# Patient Record
Sex: Male | Born: 1985 | Race: Black or African American | Hispanic: No | Marital: Single | Smoking: Never smoker
Health system: Southern US, Community
[De-identification: ages and names within clinical notes are randomized; demographics above are authoritative.]

## PROBLEM LIST (undated history)

## (undated) DIAGNOSIS — F0390 Unspecified dementia without behavioral disturbance: Secondary | ICD-10-CM

## (undated) DIAGNOSIS — F250 Schizoaffective disorder, bipolar type: Secondary | ICD-10-CM

## (undated) DIAGNOSIS — R413 Other amnesia: Secondary | ICD-10-CM

## (undated) DIAGNOSIS — F259 Schizoaffective disorder, unspecified: Secondary | ICD-10-CM

## (undated) DIAGNOSIS — R569 Unspecified convulsions: Secondary | ICD-10-CM

## (undated) DIAGNOSIS — L732 Hidradenitis suppurativa: Secondary | ICD-10-CM

## (undated) DIAGNOSIS — F909 Attention-deficit hyperactivity disorder, unspecified type: Secondary | ICD-10-CM

## (undated) DIAGNOSIS — F79 Unspecified intellectual disabilities: Secondary | ICD-10-CM

## (undated) DIAGNOSIS — F319 Bipolar disorder, unspecified: Secondary | ICD-10-CM

## (undated) HISTORY — PX: APPENDECTOMY: SHX54

---
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
## 2007-03-18 ENCOUNTER — Emergency Department (HOSPITAL_COMMUNITY): Admission: EM | Admit: 2007-03-18 | Discharge: 2007-03-18 | Payer: Self-pay | Admitting: Emergency Medicine

## 2007-10-29 DIAGNOSIS — K5900 Constipation, unspecified: Secondary | ICD-10-CM | POA: Diagnosis present

## 2008-07-15 ENCOUNTER — Inpatient Hospital Stay (HOSPITAL_COMMUNITY): Admission: EM | Admit: 2008-07-15 | Discharge: 2008-07-20 | Payer: Self-pay | Admitting: Emergency Medicine

## 2008-07-20 ENCOUNTER — Ambulatory Visit: Payer: Self-pay | Admitting: Psychiatry

## 2008-07-20 ENCOUNTER — Inpatient Hospital Stay (HOSPITAL_COMMUNITY): Admission: EM | Admit: 2008-07-20 | Discharge: 2008-07-30 | Payer: Self-pay | Admitting: Psychiatry

## 2008-09-05 ENCOUNTER — Inpatient Hospital Stay (HOSPITAL_COMMUNITY): Admission: EM | Admit: 2008-09-05 | Discharge: 2008-09-21 | Payer: Self-pay | Admitting: Emergency Medicine

## 2008-09-21 ENCOUNTER — Ambulatory Visit: Payer: Self-pay | Admitting: Psychiatry

## 2008-09-21 ENCOUNTER — Inpatient Hospital Stay (HOSPITAL_COMMUNITY): Admission: AD | Admit: 2008-09-21 | Discharge: 2008-09-25 | Payer: Self-pay | Admitting: Psychiatry

## 2010-05-28 ENCOUNTER — Emergency Department (HOSPITAL_BASED_OUTPATIENT_CLINIC_OR_DEPARTMENT_OTHER)
Admission: EM | Admit: 2010-05-28 | Discharge: 2010-05-29 | Disposition: A | Discharge status: Home / Self Care | Payer: Medicare Other | Attending: Emergency Medicine | Admitting: Emergency Medicine

## 2010-05-28 ENCOUNTER — Emergency Department (INDEPENDENT_AMBULATORY_CARE_PROVIDER_SITE_OTHER): Payer: Medicare Other

## 2010-05-28 DIAGNOSIS — Z0289 Encounter for other administrative examinations: Secondary | ICD-10-CM

## 2010-05-28 DIAGNOSIS — F79 Unspecified intellectual disabilities: Secondary | ICD-10-CM

## 2010-05-28 DIAGNOSIS — F489 Nonpsychotic mental disorder, unspecified: Secondary | ICD-10-CM | POA: Insufficient documentation

## 2010-05-28 DIAGNOSIS — F319 Bipolar disorder, unspecified: Secondary | ICD-10-CM | POA: Insufficient documentation

## 2010-05-28 DIAGNOSIS — Z8659 Personal history of other mental and behavioral disorders: Secondary | ICD-10-CM | POA: Insufficient documentation

## 2010-05-28 LAB — URINALYSIS, ROUTINE W REFLEX MICROSCOPIC
Ketones, ur: NEGATIVE mg/dL
Nitrite: NEGATIVE
Protein, ur: NEGATIVE mg/dL
Urobilinogen, UA: 1 mg/dL (ref 0.0–1.0)
pH: 6.5 (ref 5.0–8.0)

## 2010-05-28 LAB — DIFFERENTIAL
Basophils Absolute: 0 10*3/uL (ref 0.0–0.1)
Basophils Relative: 0 % (ref 0–1)
Eosinophils Absolute: 0.1 10*3/uL (ref 0.0–0.7)
Eosinophils Relative: 1 % (ref 0–5)
Lymphocytes Relative: 39 % (ref 12–46)
Lymphs Abs: 3 10*3/uL (ref 0.7–4.0)
Monocytes Absolute: 0.7 10*3/uL (ref 0.1–1.0)
Monocytes Relative: 8 % (ref 3–12)
Neutro Abs: 4 10*3/uL (ref 1.7–7.7)
Neutrophils Relative %: 52 % (ref 43–77)

## 2010-05-28 LAB — COMPREHENSIVE METABOLIC PANEL
Alkaline Phosphatase: 103 U/L (ref 39–117)
BUN: 10 mg/dL (ref 6–23)
Glucose, Bld: 92 mg/dL (ref 70–99)
Potassium: 3.9 mEq/L (ref 3.5–5.1)
Total Protein: 7.9 g/dL (ref 6.0–8.3)

## 2010-05-28 LAB — CBC
HCT: 38.7 % — ABNORMAL LOW (ref 39.0–52.0)
MCHC: 34.9 g/dL (ref 30.0–36.0)
MCV: 83.8 fL (ref 78.0–100.0)
RDW: 12.5 % (ref 11.5–15.5)
WBC: 7.8 10*3/uL (ref 4.0–10.5)

## 2010-05-28 LAB — POCT TOXICOLOGY PANEL

## 2010-05-28 LAB — ETHANOL: Alcohol, Ethyl (B): <10 mg/dL (ref 0–10)

## 2010-05-28 LAB — VALPROIC ACID LEVEL: Valproic Acid Lvl: 34.6 ug/mL — ABNORMAL LOW (ref 50.0–100.0)

## 2010-06-13 LAB — COMPREHENSIVE METABOLIC PANEL
AST: 16 U/L (ref 0–37)
BUN: 5 mg/dL — ABNORMAL LOW (ref 6–23)
CO2: 29 mEq/L (ref 19–32)
Calcium: 8.9 mg/dL (ref 8.4–10.5)
Chloride: 107 mEq/L (ref 96–112)
Creatinine, Ser: 0.84 mg/dL (ref 0.4–1.5)
GFR calc Af Amer: >60 mL/min (ref >60)
GFR calc non Af Amer: >60 mL/min (ref >60)
Glucose, Bld: 97 mg/dL (ref 70–99)
Total Bilirubin: 0.2 mg/dL — ABNORMAL LOW (ref 0.3–1.2)

## 2010-06-13 LAB — VALPROIC ACID LEVEL
Valproic Acid Lvl: 162.9 ug/mL — ABNORMAL HIGH (ref 50.0–100.0)
Valproic Acid Lvl: 84.4 ug/mL (ref 50.0–100.0)

## 2010-06-13 LAB — DIFFERENTIAL
Basophils Relative: 1 % (ref 0–1)
Lymphs Abs: 2 10*3/uL (ref 0.7–4.0)
Monocytes Absolute: 0.5 10*3/uL (ref 0.1–1.0)
Monocytes Relative: 10 % (ref 3–12)
Neutro Abs: 2.8 10*3/uL (ref 1.7–7.7)

## 2010-06-13 LAB — CBC
HCT: 39.4 % (ref 39.0–52.0)
Hemoglobin: 13.1 g/dL (ref 13.0–17.0)
Hemoglobin: 13.4 g/dL (ref 13.0–17.0)
MCHC: 34 g/dL (ref 30.0–36.0)
MCV: 92.4 fL (ref 78.0–100.0)
MCV: 92.4 fL (ref 78.0–100.0)
Platelets: 214 10*3/uL (ref 150–400)
RBC: 4.17 MIL/uL — ABNORMAL LOW (ref 4.22–5.81)
RBC: 4.26 MIL/uL (ref 4.22–5.81)
WBC: 5.3 10*3/uL (ref 4.0–10.5)
WBC: 8.3 10*3/uL (ref 4.0–10.5)

## 2010-06-13 LAB — BASIC METABOLIC PANEL
CO2: 29 mEq/L (ref 19–32)
Calcium: 9.3 mg/dL (ref 8.4–10.5)
Chloride: 102 mEq/L (ref 96–112)
Chloride: 105 mEq/L (ref 96–112)
GFR calc Af Amer: >60 mL/min (ref >60)
GFR calc Af Amer: >60 mL/min (ref >60)
GFR calc non Af Amer: >60 mL/min (ref >60)
Potassium: 4.1 mEq/L (ref 3.5–5.1)
Sodium: 140 mEq/L (ref 135–145)
Sodium: 141 mEq/L (ref 135–145)

## 2010-06-13 LAB — URINALYSIS, ROUTINE W REFLEX MICROSCOPIC
Glucose, UA: NEGATIVE mg/dL
Hgb urine dipstick: NEGATIVE
Specific Gravity, Urine: 1.025 (ref 1.005–1.030)
pH: 6.5 (ref 5.0–8.0)

## 2010-06-13 LAB — TSH: TSH: 1.885 u[IU]/mL (ref 0.350–4.500)

## 2010-06-13 LAB — ACETAMINOPHEN LEVEL: Acetaminophen (Tylenol), Serum: <10 ug/mL — ABNORMAL LOW (ref 10–30)

## 2010-06-13 LAB — GLUCOSE, CAPILLARY: Glucose-Capillary: 81 mg/dL (ref 70–99)

## 2010-06-13 LAB — RAPID URINE DRUG SCREEN, HOSP PERFORMED
Barbiturates: NOT DETECTED
Opiates: NOT DETECTED

## 2010-06-15 LAB — URINALYSIS, ROUTINE W REFLEX MICROSCOPIC
Glucose, UA: NEGATIVE mg/dL
Nitrite: NEGATIVE
Protein, ur: NEGATIVE mg/dL
pH: 7.5 (ref 5.0–8.0)

## 2010-06-15 LAB — COMPREHENSIVE METABOLIC PANEL
ALT: 25 U/L (ref 0–53)
ALT: 20 U/L (ref 0–53)
AST: 31 U/L (ref 0–37)
Albumin: 3.1 g/dL — ABNORMAL LOW (ref 3.5–5.2)
Alkaline Phosphatase: 68 U/L (ref 39–117)
Alkaline Phosphatase: 76 U/L (ref 39–117)
BUN: 12 mg/dL (ref 6–23)
BUN: 7 mg/dL (ref 6–23)
CO2: 33 mEq/L — ABNORMAL HIGH (ref 19–32)
CO2: 28 mEq/L (ref 19–32)
Calcium: 9.4 mg/dL (ref 8.4–10.5)
Chloride: 101 mEq/L (ref 96–112)
Creatinine, Ser: 0.78 mg/dL (ref 0.4–1.5)
GFR calc Af Amer: >60 mL/min (ref >60)
GFR calc Af Amer: >60 mL/min (ref >60)
GFR calc non Af Amer: >60 mL/min (ref >60)
GFR calc non Af Amer: >60 mL/min (ref >60)
Glucose, Bld: 83 mg/dL (ref 70–99)
Glucose, Bld: 93 mg/dL (ref 70–99)
Potassium: 3.3 mEq/L — ABNORMAL LOW (ref 3.5–5.1)
Potassium: 3.5 mEq/L (ref 3.5–5.1)
Sodium: 143 mEq/L (ref 135–145)
Sodium: 138 mEq/L (ref 135–145)
Total Bilirubin: 0.8 mg/dL (ref 0.3–1.2)
Total Protein: 6.5 g/dL (ref 6.0–8.3)
Total Protein: 5.4 g/dL — ABNORMAL LOW (ref 6.0–8.3)

## 2010-06-15 LAB — ETHANOL: Alcohol, Ethyl (B): <5 mg/dL (ref 0–10)

## 2010-06-15 LAB — FERRITIN: Ferritin: 126 ng/mL (ref 22–322)

## 2010-06-15 LAB — CBC
HCT: 38.4 % — ABNORMAL LOW (ref 39.0–52.0)
HCT: 36.7 % — ABNORMAL LOW (ref 39.0–52.0)
HCT: 38.8 % — ABNORMAL LOW (ref 39.0–52.0)
Hemoglobin: 12.8 g/dL — ABNORMAL LOW (ref 13.0–17.0)
Hemoglobin: 13.2 g/dL (ref 13.0–17.0)
Hemoglobin: 13.5 g/dL (ref 13.0–17.0)
MCHC: 33.4 g/dL (ref 30.0–36.0)
MCV: 92.6 fL (ref 78.0–100.0)
MCV: 93 fL (ref 78.0–100.0)
Platelets: 136 10*3/uL — ABNORMAL LOW (ref 150–400)
Platelets: 141 10*3/uL — ABNORMAL LOW (ref 150–400)
RBC: 4.14 MIL/uL — ABNORMAL LOW (ref 4.22–5.81)
RBC: 4.19 MIL/uL — ABNORMAL LOW (ref 4.22–5.81)
RDW: 13.4 % (ref 11.5–15.5)
RDW: 13.1 % (ref 11.5–15.5)
RDW: 13.5 % (ref 11.5–15.5)
WBC: 5.6 10*3/uL (ref 4.0–10.5)

## 2010-06-15 LAB — DIFFERENTIAL
Basophils Relative: 0 % (ref 0–1)
Eosinophils Absolute: 0 10*3/uL (ref 0.0–0.7)
Eosinophils Relative: 0 % (ref 0–5)
Lymphs Abs: 1.8 10*3/uL (ref 0.7–4.0)
Neutrophils Relative %: 60 % (ref 43–77)

## 2010-06-15 LAB — BASIC METABOLIC PANEL
BUN: 7 mg/dL (ref 6–23)
Chloride: 104 mEq/L (ref 96–112)
Glucose, Bld: 105 mg/dL — ABNORMAL HIGH (ref 70–99)
Potassium: 4.1 mEq/L (ref 3.5–5.1)

## 2010-06-15 LAB — SALICYLATE LEVEL: Salicylate Lvl: <4 mg/dL (ref 2.8–20.0)

## 2010-06-15 LAB — RAPID URINE DRUG SCREEN, HOSP PERFORMED
Barbiturates: NOT DETECTED
Benzodiazepines: NOT DETECTED

## 2010-06-15 LAB — RETICULOCYTES
RBC.: 4.26 MIL/uL (ref 4.22–5.81)
Retic Count, Absolute: 59.6 10*3/uL (ref 19.0–186.0)

## 2010-06-15 LAB — PROTIME-INR
INR: 1.1 (ref 0.00–1.49)
Prothrombin Time: 14.8 seconds (ref 11.6–15.2)

## 2010-06-15 LAB — IRON AND TIBC: Iron: 62 ug/dL (ref 42–135)

## 2010-06-15 LAB — VALPROIC ACID LEVEL
Valproic Acid Lvl: 62.8 ug/mL (ref 50.0–100.0)
Valproic Acid Lvl: 96.6 ug/mL (ref 50.0–100.0)

## 2010-06-15 LAB — CALCIUM, IONIZED: Calcium, Ion: 1.26 mmol/L (ref 1.12–1.32)

## 2010-06-15 LAB — ACETAMINOPHEN LEVEL: Acetaminophen (Tylenol), Serum: <10 ug/mL — ABNORMAL LOW (ref 10–30)

## 2010-06-15 LAB — TSH: TSH: 2.419 u[IU]/mL (ref 0.350–4.500)

## 2010-07-20 NOTE — Consult Note (Signed)
NAME:  Jack [name], Jack [name] NO.:  [id number]   MEDICAL RECORD NO.:  [id number]          PATIENT TYPE:  INP   LOCATION:  1236                         FACILITY:  [facility]   PHYSICIAN:  Antonietta Breach, M.D.  DATE OF BIRTH:  06-09-1985   DATE OF CONSULTATION:  07/16/2008  DATE OF DISCHARGE:                                 CONSULTATION   REFERRING PHYSICIAN:  [facility]   REASON FOR CONSULTATION:  Overdose, suicide attempt.   HISTORY OF PRESENT ILLNESS:  [name] is a 25 year old male  admitted to the [facility] on Jul 15, 2008 due to an overdose.   [name] became extremely upset and very anxious when he was told  that he could not go to church because it was Tuesday.  He stated that  he was going to kill himself.  He took several pills out of his pill box  and swallowed them.  He swallowed 5 days' worth of medication out of his  pill box.  His family was able to stop him before he went further.   His mother took him to the emergency room.   OUTPATIENT MEDICATIONS:  1. Have included Depakote 2000 mg per day.  2. Oxcarbazepine 600 mg per day.  3. Zyprexa 10 mg daily.   This evidently was [name] first suicide attempt although he has  made threats before.   PAST PSYCHIATRIC HISTORY:  [name] is followed by a psychiatrist.  The history and physical dictation states that his psychiatrist had  recently reduced his Zyprexa from 45 mg per day down to 10 mg daily.  Also Concerta was changed to Vyvanse.   His mother reported that [name] has had greater mood volatility  recently.  He is displaying inappropriate laughter and he has been hyper-  religious.   Past diagnoses have included bipolar disorder and schizophrenia.   He was admitted to the [facility] in June 2003.  At  that time he was discharged on Abilify 20 mg q.h.s., Remeron 30 mg  nightly, Depakote 500 q. a.m., 750 q.h.s., Tegretol all 200 q.  a.m. 300  q.h.s.   In review of the past medical record, there are no further psychiatric  admissions at the [facility].   FAMILY PSYCHIATRIC HISTORY:  [name] father was diagnosed with  bipolar disorder and schizophrenia.   SOCIAL HISTORY:  [name], lives at home with a girlfriend.  He was  recently in a group home.  He also has lived recently with his mother.   PAST MEDICAL HISTORY:  Seizure disorder, history of appendectomy.   MEDICATIONS:  MAR is reviewed.  He currently is on no psychotropic  medication.   ALLERGIES:  He has no known drug allergies.   LABORATORY DATA:  TSH normal, B12 normal, folate normal.  INR normal.  Depakote level was 160.7, aspirin negative.  Tylenol negative, alcohol  negative.  SGOT 31, SGPT 20.  Drug screen was positive for amphetamines.   REVIEW OF SYSTEMS:  Mr. Wescoat can provide a limited amount of  this.  The rest is gleaned from the grandmother, the nurse, and past medical  record.   Constitutional, head, eyes, ears, nose, throat, mouth, neurologic,  cardiovascular, respiratory, gastrointestinal, genitourinary, skin,  musculoskeletal, hematologic, lymphatic, endocrine, and metabolic are  all unremarkable except that of psychiatric in the past medical record,  Tourette's disorder is listed.  Also conduct disorder is mentioned in  his 2003 record.   EXAMINATION:  VITAL SIGNS:  Temperature 98.3, pulse 73, respiratory rate  13, blood pressure 111/50, O2 saturation room air 100%.  GENERAL APPEARANCE:  Mr. Schliep is a young male sitting up partially  reclined in his hospital bed in a supine position with no abnormal  involuntary movements.   MENTAL STATUS EXAM:  Mr. Adduci has intermittent eye contact.  His  attention span is mildly decreased.  Concentration mildly decreased.  Affect involves inappropriate smiling at times.  His mood involves  inappropriate smiling.  He is oriented to all spheres.  His memory is   intact to immediate recent and remote.  His fund of knowledge and  intelligence are below average.  His speech is slightly pressured at  times.  Thought process involves some coherent statements and some mild  looseness of associations.  Thought content, no current suicidal  thoughts.   Insight is poor judgment is impaired.   ASSESSMENT:  AXIS I:  1. 293.83 mood disorder not otherwise specified.  2. Rule out schizoaffective disorder.  AXIS II:  Deferred.  AXIS III:  See past medical history.  AXIS IV:  Primary support group.  AXIS V:  20.   Mr. Albor has been exhibiting progressive mood instability culminating  in a suicide attempt.   RECOMMENDATIONS:  1. Would admit to an inpatient psychiatric unit, once medically      cleared, for further evaluation and treatment.  2. For acute management would utilize Atacand 0.5 to 4 mg p.o. IM or      IV q.2 hours p.r.n. agitation.  3. Would utilize Zyprexa 10 mg p.o. or IM b.i.d. p.r.n. severe      agitation, monitoring for any stiffness or other      extrapyramidal side effects.  4. Will defer other psychotropic medication at this time.  5. Would utilize low stimulation ego support.      Antonietta Breach, M.D.  Electronically Signed     JW/MEDQ  D:  07/16/2008  T:  07/16/2008  Job:  [id number]

## 2010-07-20 NOTE — H&P (Signed)
NAME:  Jack, [name] NO.:  [id number]   MEDICAL RECORD NO.:  [id number]          PATIENT TYPE:  INP   LOCATION:  0105                         FACILITY:  [facility]   PHYSICIAN:  Manus Gunning, MD      DATE OF BIRTH:  Mar 20, 1985   DATE OF ADMISSION:  07/15/2008  DATE OF DISCHARGE:                              HISTORY & PHYSICAL   CHIEF COMPLAINT:  Suicidal attempt with drug overdose intentional.   HISTORY OF PRESENT ILLNESS:  Jack [name] is a 25 year old African  American male who was brought to the emergency department by his mother  secondary to an intentional suicide attempt with prescribed medications.  The history is provided by the mother who was at the patient's bedside  as the patient is somnolent from the medications that he ingested,  though he is hemodynamically stable and his respiratory status is stable  as well.  Apparently Jack [name] has been living at his girlfriend's  home for the past one week.  Today he decided that he wanted to go to  church and was told that it is a Tuesday and that this was impossible.  He subsequently became agitated and upset and claimed that he is going  to end his life.  At that time he opened up his pill box and started  taking the medicines one by one and chewing them.  By the time they were  able to stop him they noticed that his evening medications from Tuesday  to Saturday had all been ingested.  These include the following  medicines and their daily doses:  Zyprexa 10 mg, Oxcarbazepine 600, and  Depakote ER 2 g.  This is times a total of five days at once.  He is  brought to the emergency department at which time he received activated  charcoal and subsequently has fallen asleep and at the time of my  interview was very difficult to arouse, though with sternal rub he did  awake, but did not follow any commands and fell back asleep almost  immediately.  The mother claims that he has claimed that he would like  to end  his life in the past and as a result has been admitted to the  hospital for the same.  But she claims that despite having claimed  wanting to hurt himself, he has never truly acted upon it until this  time.  Also of note his psychiatrist is Dr. Marcelle Overlie and approximately  three weeks ago, changes in his medication were made including Zyprexa  dose was decreased from 15 mg three times a day to 10 mg at bedtime, and  Concerta was changed to Vyvanse.  His mother claims that since this  change in his medication she has noticed a change in his psychiatric  well-being and claims that he has become more volatile and easily upset.  She has attempted to get in touch with her primary psychiatrist today,  unfortunately has not heard back from them yet and in the meantime, the  patient unfortunately has attempted to take his life by drug overdose.  In  the emergency department the patient was hemodynamically stable and  saturating 96% on room air.  All laboratory workup demonstrated normal  metabolic levels.  He has had a negative salicylate and acetaminophen as  well as alcohol level.  His urine drug screen did demonstrate  amphetamines but he claims that he has been taking Adderall at home and  upon further questioning his mother claims that the patient's  girlfriend's daughter has a prescription for Adderall but the patient  does not.  Also his valproic acid level is 160.7.   PAST MEDICAL/SURGICAL HISTORY:  1. Bipolar disorder.  2. Schizophrenia.  3. Dementia.  4. Seizure disorder.  5. Appendectomy.  6. History of suicidal ideations.   SOCIAL HISTORY:  There is no history of tobacco, illicits, or alcohol.  He lives currently with his girlfriend, used to live with his mother,  preceding which he was at a home for patients with mental disease.   FAMILY HISTORY:  Mother has no medical history of significance.  Father  had a history of bipolar disorder and schizophrenia, is currently   deceased.   ALLERGIES:  The patient has no known drug allergies.   HOME MEDICATIONS:  Confirmed with patient's mother.  1. Zyprexa 10 mg at bedtime.  2. Citalopram HBR 20 mg daily.  3. Vyvanse 60 mg daily.  4. Oxcarbazepine 600 mg twice a day.  5. Depakote ER 500 mg four times at bedtime.   REVIEW OF SYSTEMS:  Essential 14-point review of systems attempted,  unfortunately, unable to obtain secondary to the patient's presenting  condition, and I have attempted to obtain this from his mother, who  denies fever, cough, expectoration, no shortness of breath, no dyspnea  on exertion, no chest pain, palpitations, PND, or orthopnea.  No history  of abdominal pain, nausea, vomiting, diarrhea, history of polyuria,  hematuria, bright red blood per rectum, melanotic stools, diarrhea.  Denies syncope, presyncope, no tinnitus.  No odynophagia, dysphagia, no  recent falls.   PHYSICAL EXAMINATION:  VITAL SIGNS:  At time of presentation,  temperature 99, heart rate 112, respiratory rate 18, blood pressure  109/57, O2 saturation 96%.  GENERAL:  Well-nourished, well-developed, African American male, asleep  in bed in no apparent distress.  HEENT:  Normocephalic, atraumatic.  Moist oral mucosa.  No thrush,  erythema, or post nasal drip.  Eyes:  Anicteric, extraocular muscles are  intact.  Pupils are equal and reactive to light and accommodation.  Conjunctivae pale.  CARDIOVASCULAR:  S1/S2 normal.  Regular rate and rhythm.  No murmurs,  rubs, or gallops.  LUNGS:  Air entry is bilaterally equal.  No rales, rhonchi, or wheezes  are appreciated.  ABDOMEN:  Soft, nontender, nondistended.  Positive bowel sounds.  No  organomegaly.  EXTREMITIES:  No cyanosis, clubbing, or edema.  Positive bilateral  dorsalis pedis.  CENTRAL NERVOUS SYSTEM:  Asleep, difficult to arouse with sternal rub  only.  Reflexes bilaterally symmetrical.  Sensation:  Withdraws to pain.  Babinski's bilaterally downgoing.   HEMATOLOGY/ONCOLOGY:  No palpable lymphadenopathy, ecchymosis, or  petechiae.  SKIN:  No breakdown, swelling, ulcerations, or masses.  NECK:  Supple.  Good range of motion.  No thyromegaly.  No carotid  bruits.   LABORATORY DATA:  Valproic acid level 160.7, salicylate level less than  4, acetaminophen level less than 10, alcohol level less than 5.  Sodium  138, potassium 3.5, chloride 101, CO2 28, glucose 93, BUN 12, creatinine  1.06.  Total bilirubin 0.8, alkaline phosphatase 76, AST  31, ALT 20,  total protein 7.4, albumin 4, calcium 9.4.  Urine drug screen  demonstrates positive for amphetamines, otherwise negative.  White blood  cell count 5600, hemoglobin 13.5, hematocrit 38.8, platelets 158,  polymorphs 60%.  UA is negative.   ASSESSMENT/PLAN:  1. Suicide attempt with prescribed medications.  The patient has taken      five days' worth of Zyprexa 10, Oxcarbazepine 600, and Depakote ER      2 g.  At this time he is somnolent but hemodynamically and      respiratory-wise stable.  We will admit to the step-down unit for      close monitoring and maintain O2 saturations greater than 90%  We      will obtain a one-on-one sitter as well.  Check a.m. EKG.  Start      fluids at normal saline 125 ml an hour and recheck chest x-ray in      the morning.  I am unsure the patient has not aspirated, though at      this time there is no indication of same.  Also obtain a      psychiatric/behavioral health consult in the morning.  2. GI/DVT prophylaxis.  Protonix 40 mg p.o. daily and Lovenox 40 mg      subcu at bedtime.   We will hold all home medications at this time.  The patient will need a  psychiatric formal behavioral health consult in the morning.      Manus Gunning, MD  Electronically Signed     SP/MEDQ  D:  07/15/2008  T:  07/15/2008  Job:  [id number]

## 2010-07-20 NOTE — Consult Note (Signed)
NAME:  Jack [name] NO.:  [id number]   MEDICAL RECORD NO.:  [id number]          PATIENT TYPE:  INP   LOCATION:  1236                         FACILITY:  [facility]   PHYSICIAN:  Antonietta Breach, M.D.  DATE OF BIRTH:  Nov 04, 1985   DATE OF CONSULTATION:  09/05/2008  DATE OF DISCHARGE:                                 CONSULTATION   REQUESTING PHYSICIAN:  [facility].   REASON FOR CONSULTATION:  Overdose.   HISTORY OF PRESENT ILLNESS:  [name] is a 25 year old male  admitted to the [facility] on September 04, 2008, due to a drug  overdose.   His overdose included an unknown amount of Trileptal and Depakote.   He has been suffering from mood instability for approximately 3 weeks.  He possibly was noncompliant with his medication.  He states that the  depression did come back, the overdose was intentional.  He does not  have any evidence of internal stimulation.  He is noncombative.  He  still does have some sedation from the overdose.  He is willing to  receive psychiatric help.   When asked about psychosocial stressors, he is vague.   PAST PSYCHIATRIC HISTORY:  In review of the past medical record, Mr.  Jack [name] does have a history of at least 2 psychiatric admissions to the  [facility].   He was discharged from the [facility] on Jul 30, 2008.  At that time he had been admitted for an overdose.  His family  prior to that admission had described him as increasingly impulsive.  He  had been on some anti-ADHD medication, Concerta, and he had been  switched to Vyvanse.  Listed also in that hospital record is a history  of Tourette's disorder and borderline intellectual functioning.   He was discharged with a diagnosis of impulse control disorder, not  otherwise specified, and on Axis II, mild mental retardation.   In May of this year he had an exacerbation of behavior where he was  displaying  inappropriate laughter and had been hyperreligious.  He had  not been able to tolerate that the church was not open at an odd hour  when he showed up there.  This precipitated an overdose of medication.   His discharge medications at that time were:  1. Depakote 1000 mg b.i.d.  2. Zyprexa 10 mg b.i.d. and 20 mg q.h.s.  3. Trileptal 600 mg b.i.d.  4. Cogentin 1 mg b.i.d.  5. Celexa 20 mg daily.   FAMILY PSYCHIATRIC HISTORY:  Unknown.   SOCIAL HISTORY:  [name] has been living with his mother and his  girlfriend.  He also has been in a group home this year.   PAST MEDICAL HISTORY:  Status post polysubstance overdose with mild  residual sedation.   ALLERGIES:  No known drug allergies.   MEDICATIONS:  His MAR is reviewed.  He is on supportive medications of  Zofran and Protonix as well as low-molecular-weight heparin.   LABORATORY DATA:  TSH normal.  Depakote 85.8.  Sodium 142, BUN  5,  creatinine 0.84, glucose 97, SGOT 16, SGPT 10.  Ammonia normal.  His  Depakote level prior to the one mentioned above at peak was 162.9.  Drug  screen was positive for amphetamines.  Aspirin negative.  Tylenol  negative.  WBC 5.3, hemoglobin 13.1, platelet count 168.   REVIEW OF SYSTEMS:  [name] can only provide part of this.  The rest  is gleaned from the staff and the chart.   Constitutional, head, eyes, ears, nose, throat, mouth, neurologic,  psychiatric, cardiovascular, respiratory unremarkable except that he  does have a history of pneumonia.  Gastrointestinal, genitourinary,  skin, musculoskeletal, hematologic/lymphatic, endocrine/metabolic all  unremarkable.   EXAMINATION:  VITAL SIGNS:  Temperature 98.1, pulse 78, respiratory rate  14, blood pressure 101/47.  GENERAL APPEARANCE:  [name] is a young male lying in a partially  right lateral decubitus position in his hospital bed with no abnormal  involuntary movements.  There are no tics evident.  MENTAL STATUS EXAM:  Mr.  Jack [name] eye contact is poor.  He is still  slightly sedated.  His affect is constricted.  His attention span is  mildly decreased, concentration mildly decreased.  Mood is depressed.  He is oriented to all spheres.  His memory is intact.  His fund of  knowledge and intelligence are assessed as below average.  His speech is  soft with a slightly flat prosody.  Thought process is coherent.  Thought content:  He does acknowledge suicidal intent.   Insight is poor.  Judgment is impaired.   ASSESSMENT:  Axis I:  293.83, Mood disorder, not otherwise specified.  Rule out impulse control disorder, not otherwise specified.  Axis II:  Deferred.  Axis III:  Status post polysubstance overdose.  Axis IV:  Primary support group.  Axis V:  20.   [name] is at risk for self-destructive and suicidal behavior.   RECOMMENDATIONS:  1. Would admit to an inpatient psychiatric unit for further evaluation      and treatment.  2. For anti-acute agitation, would utilize Ativan 1-3 mg p.o., IM or      IV q.6 h. p.r.n. with caution regarding      sedation or ataxia.  3. Would provide low-stimulation ego support.  4. Other psychotropic medication deferred to his psychiatric inpatient      program.      Antonietta Breach, M.D.  Electronically Signed     JW/MEDQ  D:  09/06/2008  T:  09/06/2008  Job:  [id number]

## 2010-07-20 NOTE — H&P (Signed)
NAME:  Jack [name], Jack [name] NO.:  [id number]   MEDICAL RECORD NO.:  [id number]          PATIENT TYPE:  IPS   LOCATION:  0502                          [facility]ACILITY:  [facility]   PHYSICIAN:  Geoffery Lyons, M.D.      DATE OF BIRTH:  1985-06-05   DATE OF ADMISSION:  07/20/2008  DATE OF DISCHARGE:                       PSYCHIATRIC ADMISSION ASSESSMENT   IDENTIFYING INFORMATION:  This is a 25 year old male who is single.  This is a voluntary admission.   HISTORY OF THE PRESENT ILLNESS:  [facility] admission for this 23-year-  old who was admitted by way of the medical unit on Jul 15, 2008, for an  overdose of about 5 days of his medications.  According to his family  who brought him to the emergency room, he has been a bit more impulsive  and restless since his medication was changed from Concerta in the early  morning to Vyvanse in the morning approximately 3 weeks ago.  On the day  of admission, he awoke and insisted that he wanted to go to church that  day only it was Tuesday.  On being informed that there was no church, he  became upset, went to his pill box, and took about 5 days' worth of  medications.  His mother reports that he has threatened suicide in the  past but has never acted on this before.  Please see the discharge  summary by Dr. Renee Ramus noted in the record.  He did not require  ventilator support.  He received supportive care and close monitoring.  Also received a psychiatric consul that was done by Dr. Antonietta Breach  on Jul 16, 2008.  His affect appears distracted today and his behavior  and speech are rather disorganized.  He has expressed no dangerous  ideas.   PAST PSYCHIATRIC HISTORY:  Elmore is currently followed as an outpatient  by Willette Alma, the P.A. at [facility] in [facility].  He has a history of a prior admission to [facility] from August 06, 2001, to August 16, 2001.  At that time, was suicidal  with a plan to cut  his wrists but did not in fact harm himself.  Discharge diagnoses at that time were major depression, severe,  attention deficit hyperactivity disorder, oppositional defiant disorder,  Tourette's disorder, and a possible AXIS II of rule out borderline  intellectual functioning.  He also has a history of seizure disorder.  At the time of discharge in 2003, he was stabilized on Depakote 500 mg  in the morning and 750 q.h.s.  He was also taking Tegretol 200 in the  morning and 300 at h.s., Remeron 30 mg q.h.s. and Abilify 20 at bedtime.   SOCIAL HISTORY:  Single African American male.  Apparently living in his  parents home with his girlfriend.  Previously lived in a group home.  Never married.  No children.   FAMILY HISTORY:  Not available.   MEDICAL HISTORY:  Primary care practitioner is not available.   MEDICAL PROBLEMS:  Include:  1. Status post polypharmacy overdose.  2. Seizure disorder, NOS.   PAST MEDICAL HISTORY:  Appendectomy.   CURRENT MEDICATIONS:  1. Zyprexa 15 mg t.i.d., decreased to 10 mg q.h.s. about 3 weeks ago.  2. Depakote 2000 mg p.o. q.h.s.  3. Previously on Concerta, dose unknown, currently Vyvanse 60 mg      daily.  4. Oxcarbamazepine 600 mg b.i.d.  5. Celexa 20 mg daily.   DRUG ALLERGIES:  NONE.   PHYSICAL EXAM:  Was done in the emergency room as noted in the record.  This is a large, stocky build but physically healthy-appearing African  American male, 5 feet 9 inches tall, 210 pounds with admitting vital  signs temp 97, pulse 92, respirations 18, blood pressure 118/65.   DIAGNOSTIC STUDIES:  Significant for a valproic acid of 160.7 on Jul 15, 2008, at the time of admission and this morning, after Depakote being  held of 2.5 mg/dL.  Chemistry, sodium 141, potassium 4.1, chloride 104,  carbon dioxide 31, BUN 7, creatinine 0.80, and glucose of 105.  CBC, WBC  6.3, hemoglobin 13.2, hematocrit 38.8, and platelets 141,000.  TSH  2.419.   MENTAL STATUS  EXAM:  A fully alert male, appears internally distracted.  His response to his name is rather variable.  He appears internally  distracted and this morning was sitting on the floor in the hallway  attempting to put a book on his foot as if it was a shoe.  He has had  some disorganized motor behavior.  Appears internally distracted.  Impulse control and judgment are impaired.  He does respond to his name.  Not oriented to time.  Unable to give much history.  Not expressing any  dangerous thoughts.  He is directable.  Took his medications willingly.  Has been cooperative with peers.   AXIS I:  1. Mood disorder, NOS.  Rule out mood disorder secondary to seizure      activity  2. History of Tourette disorder.  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  AXIS III:  1. Seizure disorder.  2. Status post polypharmacy overdose.  3. Mild thrombocytopenia.  AXIS IV:  Deferred.  AXIS V:  Current 28, past year not known.   PLAN:  Voluntarily admit him.  Stabilize his mood.  Improve his  functioning, insight, and coping.  We are hoping to get some additional  information from his parents who we are going to contact.  We will  arrange a family session.  We are going to recheck a valproate level,  complete metabolic panel, and CBC on the morning of Jul 23, 2008.  We  have restarted his Depakote at 1000 mg now along with 1 mg of Ativan.  We are continuing his Celexa 20 mg and his Zyprexa 10 mg q.h.s. with 5  mg every 6 hours p.r.n. for agitation.  At this point, we are going to  hold the  stimulant medications until we hear more from his family and can get  some additional history.  We are placing him under one-to-one  observation today to ensure his safety and see how he responds with  staff and peers and he has been cooperative and directable.      Margaret A. Scott, N.P.      Geoffery Lyons, M.D.  Electronically Signed    MAS/MEDQ  D:  07/21/2008  T:  07/21/2008  Job:   [id number]

## 2010-07-20 NOTE — Consult Note (Signed)
NAME:  FERRY, MATTHIS                    ACCOUNT NO.:   MEDICAL RECORD NO.:  [id number]           PATIENT TYPE:   LOCATION:                                 FACILITY:   PHYSICIAN:  Antonietta Breach, M.D.       DATE OF BIRTH:   DATE OF CONSULTATION:  09/19/2008  DATE OF DISCHARGE:                                 CONSULTATION   Mr. Temme is cooperative with peer.  He is not combative.  He has no  thoughts of harming himself or others.  He is not having hallucinations  or delusions.  He is motivated to be discharged to a group home.   He is not having any adverse medication effects.  He is on Depakote 1000  mg q.h.s. and Zyprexa 20 mg daily.   REVIEW OF SYSTEMS:  NEUROLOGIC:  No stiffness or other extrapyramidal  side effects with Zyprexa.   PHYSICAL EXAMINATION:  VITAL SIGNS:  Temperature 97.2, pulse 86,  respiratory rate 18, blood pressure 99/65.   MENTAL STATUS EXAM:  Mr. Chadderdon is alert.  His eye contact is good.  His affect is broad and appropriate.  His mood is within normal limits.  He is oriented to all spheres.  His memory is intact.  Thought process  is logical, coherent.  There are no looseness of associations.  Thought  content, no thoughts of harming himself or others.  No delusions or  hallucinations.  Judgment is intact for group home living.   ASSESSMENT:  1. Mood disorder, 293.83, not otherwise specified, stable.  2. 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   RECOMMENDATIONS:  Would ask the social worker to set up Mr. Minder with  an outpatient psychiatric appointment during the first week of  discharge.   Mr. Mosqueda will require regular abnormal involuntary movement scale  checks as well as hemoglobin A1c checks for Zyprexa adverse effect  screening.   He also would require periodic CBC and liver function panel screening  for Depakote.   Mr. Scheier is psychiatrically cleared for group home placement.      Antonietta Breach,  M.D.     JW/MEDQ  D:  08/06/2009  T:  08/07/2009  Job:  [id number]

## 2010-07-20 NOTE — Discharge Summary (Signed)
NAME:  Jack [name], Jack [name] NO.:  [id number]   MEDICAL RECORD NO.:  [id number]          PATIENT TYPE:  INP   LOCATION:  1505                         FACILITY:  [facility]   PHYSICIAN:  Renee Ramus, MD       DATE OF BIRTH:  10/01/1985   DATE OF ADMISSION:  07/15/2008  DATE OF DISCHARGE:  07/18/2008                               DISCHARGE SUMMARY   The patient does not have a primary care physician.   <PRIMARY /DISCHARGE DIAGNOSIS/>  Suicide attempt secondary to drug overdose.   SECONDARY DIAGNOSES:  1. Schizophrenia.  2. Bipolar disorder.  3. Aspiration pneumonia.  4. Transient hypokalemia.  5. Depression.   HOSPITAL COURSE:  1. Suicide attempt:  The patient is a 25 year old male, admitted after      an intentional suicide attempt by prescription medications.  The      patient was seen in the emergency department, was admitted to our      service.  The patient has been cleared medically for transfer to      inpatient psychiatric facility.  He was seen by psychiatry in      consult and they recommended inpatient commitment.  The patient has      no further medical problems with regards to this diagnosis.  2. Schizophrenia:  The patient will be continued on his Zyprexa,      citalopram and Vyvanse, as well as his oxcarbazepine and Depakote.      These all will address his psychiatric problems, including      schizophrenia, bipolar disorder and depression.  3. Transient hypokalemia:  The patient did have a decrease in      potassium that was relieved by 10 mEq p.o. potassium times one.   LABS OF NOTE:  1. Normal CBC.  2. Mildly increased blood glucose at 105.  3. Hypokalemia with potassium of 3.3, increasing to 4.1 after 10 mEq      p.o. potassium.  4. Ferritin of 126 with serum iron of 62.  5. Tox screen positive for methamphetamines.  6. Tylenol level less than 10 with Depakote level of 160.7.  7. UA with trace ketones.   STUDIES:  1. Portable chest x-ray,  showing question of aspiration pneumonitis.  2. Follow-up chest x-ray showing resolution of aspiration pneumonitis.   MEDICATIONS AT DISCHARGE:  1. Zyprexa 10 mg p.o. q.h.s.  2. Citalopram 20 mg p.o. daily.  3. Vyvanse 60 mg p.o. daily.  4. Oxcarbazepine 600 mg p.o. b.i.d.  5. Depakote ER 5 mg 4 tablets p.o. q.h.s.   There are no labs or studies pending at time of discharge.  The patient  is in stable condition and anxious for discharge.   Time spent 35 minutes.      Renee Ramus, MD  Electronically Signed     JF/MEDQ  D:  07/18/2008  T:  07/18/2008  Job:  [id number]

## 2010-07-20 NOTE — Discharge Summary (Signed)
NAME:  ZYRION, COEY NO.:  [id number]   MEDICAL RECORD NO.:  [id number]          PATIENT TYPE:  IPS   LOCATION:  0403                          FACILITY:  [facility]   PHYSICIAN:  Anselm Jungling, MD  DATE OF BIRTH:  1985/07/27   DATE OF ADMISSION:  07/20/2008  DATE OF DISCHARGE:  07/30/2008                               DISCHARGE SUMMARY   IDENTIFYING DATA/REASON FOR ADMISSION:  This was the second [facility]  admission for Gerasimos, a 25 year old African American male who was admitted  in the aftermath of an overdose.  He had been living with his family.  They described that he had been increasingly impulsive and restless  since his medication was changed from Concerta to Vyvanse approximately  3 weeks prior.  Please refer to the admission note for further details  pertaining to the symptoms, circumstances and history that led to his  hospitalization.  He was given an initial Axis I diagnosis of mood  disorder NOS, history of Tourette's disorder, history of ADHD, history  of borderline intellectual functioning.   MEDICAL/LABORATORY:  The patient came to [place] with a history of seizure  disorder NOS, mild thrombocytopenia.  He was medically and physically  assessed in the emergency department, and then in the psychiatric  program by the psychiatric nurse practitioner.  There were no acute  medical issues during this inpatient psychiatric stay.  He was continued  on Trileptal 600 mg b.i.d. and Depakote 1000 mg b.i.d., both  anticonvulsants, although they undoubtedly had some benefit in terms of  mood stabilization as well.   HOSPITAL COURSE:  The patient was admitted to the adult inpatient  psychiatric service.  He presented as a well-nourished, normally  statured young adult male who was clearly of subnormal intellectual  ability and psychosocial development.  His developmental status is such  that he would be estimated to be at approximately the latency age of  development,  that is 29-18 years old.  There is a highly impulsive  quality with short attention span.  He tended to show impulsive emotions  throughout his inpatient stay with a lot of deliberate and volitional  acting out and limit testing.  He required one-to-one staffing  throughout most of his stay for this reason.  He was continued on a  psychotropic regimen that included Zyprexa, Depakote, Trileptal,  Cogentin, and Celexa.   Case management was in contact with his mother throughout his stay.  It  was anticipated throughout that he would return to the family home once  stabilized.   The patient appeared appropriate for discharge on the 11th hospital day.  At that time, he had done well off his one-to-one staffing for over 24  hours.  He was in better spirits, less irritable, and he responded well  to the praise and recognition we gave him for better self-control.  His  mother indicated that she was ready to accept him back in the home at  that time.  The discharge plan was as follows.   AFTERCARE:  The patient was to follow up with his usual outpatient  Maizy Davanzo to be arranged at the time of his discharge.  He was given a 7  day supply of samples plus 30 day prescriptions for the following:  1. Depakote 1000 mg p.o. b.i.d.  2. Zyprexa 10 mg b.i.d. and 20 mg q.h.s.  3. Trileptal 600 mg b.i.d.  4. Cogentin 1 mg b.i.d.  5. Celexa 20 mg daily.   He was not treated with any psychostimulant for ADHD during this stay.   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  AXIS II:  Mild mental retardation.  AXIS III:  History of seizure disorder not otherwise specified.  AXIS IV:  Stressors severe.  AXIS V:  Global assessment of functioning on discharge 45.      Anselm Jungling, MD  Electronically Signed     SPB/MEDQ  D:  07/30/2008  T:  07/30/2008  Job:  [id number]

## 2010-07-20 NOTE — H&P (Signed)
[name], [name] NO.:  [id number]   MEDICAL RECORD NO.:  [id number]          PATIENT TYPE:  INP   LOCATION:  0103                         FACILITY:  [facility]   PHYSICIAN:  Peggye Pitt, M.D. DATE OF BIRTH:  May 19, 1985   DATE OF ADMISSION:  09/04/2008  DATE OF DISCHARGE:                              HISTORY & PHYSICAL   PATIENT'S PRIMARY CARE PHYSICIAN:  He does not have one.  That makes him  unassigned to Jack [name].   CHIEF COMPLAINT:  Drug overdose, suicide attempt.   HISTORY OF PRESENT ILLNESS:  [name] is a 25 year old African  American young man with a history of schizophrenia, bipolar disorder who  has been admitted several times to our hospital and to [facility]  for intentional drug overdose by the way of suicide attempts.  Unfortunately, the patient at this moment is extremely sedated and there  are no family members present, so most of the history is obtained by the  emergency department physician and by looking at his past charts.  Per  EDP recollection, and it appears that Mr. Shackett took an unknown amount  of Depakote, as well as his Trileptal because he wanted to make people  around him upset that he might die.  Unfortunately, I am not able to  question the patient on any of the history.  When I try to arouse him to  sternal rub, he briefly opens his eyes, says what and then go straight  back to sleep.   ALLERGIES:  He has no known drug allergies.   PAST MEDICAL HISTORY:  1. Significant for schizophrenia.  2. Bipolar disorder.  3. History of aspiration pneumonia.  4. History of possible depression.   HOME MEDICATIONS:  1. Depakote 1000 mg twice daily.  2. Zyprexa 10 mg twice daily and 20 mg at bedtime.  3. Trileptal 600 mg twice daily.  4. Cogentin 1 mg twice daily.  5. Celexa 20 mg daily.   FAMILY HISTORY AND SOCIAL HISTORY AND REVIEW OF SYSTEMS:  I am unable to  obtain at this time, secondary to the patient's extreme  drowsiness.   PHYSICAL EXAM:  VITAL SIGNS UPON ADMISSION:  Blood pressure 108/73,  heart rate 98, respirations 20, O2 sats 97% on room air with a  temperature of 98.0.  GENERAL:  He is very sedated and drowsy.  I am unable to arouse him to  voice.  He does awaken briefly to sternal rub, opens his eyes, responds  very brief questions and follows right back to sleep.  The only question  he was able to respond is when I asked him where are you he said in  the hospital.  HEENT: He is normocephalic, atraumatic.  His pupils are equally  reactive.  They are not miotic.  He has no scleral icterus or jaundice.  His tongue and lips are stained black from the activated charcoal.  NECK:  Supple.  No JVD, no lymphadenopathy, no bruits, no goiter.  LUNGS:  Clear to auscultation bilaterally.  HEART:  Regular rate and rhythm with no murmurs, rubs or gallops.  ABDOMEN:  Soft, nontender, nondistended with positive bowel sounds.  EXTREMITIES:  He has no clubbing, cyanosis or edema with positive pedal  pulses.   LABS UPON ADMISSION:  Sodium 141, potassium 3.9, chloride 102, bicarb  29, BUN 9, creatinine 0.93 with a glucose of 81.  WBC 5.3, hemoglobin  13.1, platelet count of 168.  Urinalysis that is negative and aspirin of  less than 4, Tylenol level of less than 10.  A UDS is positive for  amphetamines.  Valproic acid level was initially normal at 84.4.  It is  now elevated at 162.9.   ASSESSMENT AND PLAN:  1. A suicide attempt by the way of multidrug overdose.  He is      currently very sedated and lethargic.  We will admit to step-down      unit to follow his cardiac rhythm.  I will also monitor for      hypotension caused by alpha blockade with the olanzapine as we are      at this point unsure as to what medications he did overdose on.      Because of his Depakote overdose, we will check ammonia levels.  If      they are elevated, he may benefit from carnitine.  We will recheck      Depakote  level in 6 hours.  We will check an EKG.  He will have a      bedside sitter present, as well as suicide precautions.  We will      also obtain a psychiatric consultation:  Dr. Jeanie Sewer has already      been consulted.  We will hold all of his psychiatric medications      until he can be evaluated by psychiatry.  He has already received a      dose of activated charcoal.  For prophylaxis while in the hospital,      he will be on Protonix for GI prophylaxis and Lovenox for DVT      prophylaxis.      Peggye Pitt, M.D.  Electronically Signed     EH/MEDQ  D:  09/05/2008  T:  09/05/2008  Job:  [id number]

## 2010-07-20 NOTE — Discharge Summary (Signed)
[name], [name] NO.:  [id number]   MEDICAL RECORD NO.:  [id number]          PATIENT TYPE:  INP   LOCATION:  1236                         FACILITY:  [facility]   PHYSICIAN:  Peggye Pitt, M.D. DATE OF BIRTH:  1985-07-01   DATE OF ADMISSION:  09/04/2008  DATE OF DISCHARGE:  09/06/2008                               DISCHARGE SUMMARY   DISCHARGE DIAGNOSES:  1. A suicide attempt by the way of multidrug overdose including at      least Depakote, Trileptal and Zyprexa.  2. Schizophrenia.  3. Bipolar disorder.   DISCHARGE MEDICATIONS:  None.   HOME MEDICATIONS:  1. Depakote 1000 mg twice daily.  2. Zyprexa 10 mg twice daily and 20 mg at bedtime.  3. Trileptal 600 mg twice daily.  4. Cogentin 1 mg twice daily.  5. Celexa 20 mg daily.   DISPOSITION AND FOLLOWUP:  Jack [name] is to be transferred to  [facility] today pending bed availability at  recommendation of Psychiatry.  His medications will be adjusted by  Psychiatry.   CONSULTATIONS THIS HOSPITALIZATION:  Dr. Jeanie Sewer.   IMAGES AND PROCEDURES:  None.   HISTORY AND PHYSICAL EXAM:  For full details please see dictation by  myself on September 05, 2008 but in brief, Jack [name] is a 25 year old  African American man with a history of multiple suicide attempts and  admissions to [facility] who presented yesterday to the  hospital after a repeat suicide attempt.  At time of admission I was  unable to obtain any history, however, today patient tells me that he  just wanted to die and make people around him upset which is why he took  the medications.  He appears to be very childish and is smiling  throughout my entire examination today.  Because of his extreme lethargy  and sedation we were called to admit him for medical clearance prior to  transfer to [facility].   HOSPITAL COURSE BY ACTIVE PROBLEM:  1. Suicide attempt by the way of multidrug overdose.  He was  admitted      to step-down unit given his extreme lethargy.  An EKG did not show      any evidence of QT prolongation.  He did not demonstrate any      evidence of hypotension secondary to alpha blockade by his atypical      antipsychotic, namely Zyprexa.  His repeat Depakote level is down      to 80 from 160.  He did not show any evidence of hyperammonemia,      hence he has not received any carnitine.  He did receive a dose of      activated charcoal.  His LFTs have remained stable.  He has now      been deemed medically stable for transfer to [facility] as per Dr. Providence Crosby recommendations      upon his consultation.  2. Vital signs on day of discharge:  Blood pressure 105/70, heart rate      79, respirations 14, O2  sats 98% on 2 liters with a temperature of      98.5.      Peggye Pitt, M.D.  Electronically Signed     EH/MEDQ  D:  09/06/2008  T:  09/06/2008  Job:  [id number]   cc:   Antonietta Breach, M.D.

## 2010-07-20 NOTE — H&P (Signed)
NAMECIRILO, CANNER NO.:  [id number]   MEDICAL RECORD NO.:  [id number]          PATIENT TYPE:  IPS   LOCATION:  0406                          FACILITY:  [facility]   PHYSICIAN:  Anselm Jungling, MD  DATE OF BIRTH:  14-Apr-1985   DATE OF ADMISSION:  09/21/2008  DATE OF DISCHARGE:                       PSYCHIATRIC ADMISSION ASSESSMENT   A.  Practitioner dictating for Dr. Geralyn Flash the psychiatric  admission.   ASSESSMENT:  The patient Deral Schellenberg of her and was [id number] date of  admission is September 21, 2008.  The assessment September 23, 1998, December 17, 2008.   IDENTIFICATION:  A 25 year old male, single.  This is a voluntary  admission.   HISTORY OF PRESENT ILLNESS:  Third [facility] admission for this 25 year old  who presented in a somewhat sedated state by way of the emergency room  after taking an overdose of an unknown amount of medications believed to  be primarily Trileptal and Depakote.  He was admitted to the medical  unit from July 2-18, 2010, for stabilization.  He was unable to be very  clear about why he had taken the overdose, but did admit that it was  intentional.  He has a history of some impulsive disorder and impulsive  behaviors.  This is not a first overdose.  He was not under the  influence of illicit substances.  He is unable to fight any particular  stressors at this time.   PAST PSYCHIATRIC HISTORY:  Third [facility] admission.  He has a history of  mild mental retardation and impulse control disorder was previously  hospitalized here at [facility] May 16-26, 2010, and prior to that, admitted to  our child and adolescent unit in 2003.  In 2003, he was prescribed  clonidine, also had trials of Abilify and Remeron and Tegretol, at that  time in addition to his Depakote.  He is currently followed by [facility] as an outpatient.   SOCIAL HISTORY:  Single African American male, has been living with his  girlfriend.  Is closely monitored by  his mother who was involved and  supportive.  No known legal problems.  He completed the twelfth grade.  Has a history of learning disabilities and mild mental retardation.   FAMILY HISTORY:  Not available.   ALCOHOL AND DRUG HISTORY:  He denies substance abuse.   MEDICAL HISTORY:  Primary care Irmalee Riemenschneider is unknown.   MEDICAL PROBLEMS:  None at this time.  He is post polypharmacy overdose,  but was medically stabilized on the medical unit.   PAST MEDICAL HISTORY:  A history of seizure disorder with previous  workup at [facility].  Also has a history of vocal and motor tic  disorder and was previously diagnosed with Tourette syndrome.   CURRENT MEDICATIONS:  1. Depakote 1000 mg twice a day.  2. Zyprexa 10 mg twice a day and 20 mg at bedtime.  3. Trileptal 600 mg b.i.d.  4. Cogentin 1 mg twice a day.  5. Celexa 20 mg daily.   DRUG ALLERGIES:  None.   PHYSICAL EXAMINATION:  Done  in the emergency room, please see the  admission note from the medical unit and the complete discharge summary  dictated by Dr. Ardyth Harps on July 18.   DIAGNOSTIC STUDIES:  Diagnostic studies were done at the time of  admission:  BUN 9, creatinine 0.93, random glucose of 81.  Electrolytes  within normal limits.  CBC reflected WBC 5.3, hemoglobin 13.1 and  platelets of 168,000.  His urinalysis was unremarkable and aspirin,  Tylenol levels negative.  Urine drug screen was positive for  amphetamines.  His valproate level was 84.4 at the time of admission and  did spike to 162.9 after the overdose.  Most recent valproate level had  normalized within range of 85.8.   PHYSICAL EXAMINATION:  GENERAL:  He is a well-nourished, well-developed  male, today in no distress, up and ambulatory, muscular build, normal  motor exam, in no distress.  Full PE is noted in the record done in the  emergency room.   MENTAL STATUS EXAM:  Reveals a well-nourished, well-developed male,  alert, oriented x4, at this time.   Mild to moderate mental retardation.  Mood is neutral.  No evidence of psychosis or thought disorder.  Gives a  limited history.  Unable to explain any reason why he took the overdose.  Denying any suicidal or homicidal thoughts.  Focused on food and comfort  items today.  He is fully oriented with poor insight.   DISCHARGE DIAGNOSES:  AXIS I:  Impulse control disorder, NOS.  AXIS II:  Mild mental retardation.  AXIS III:  Status post polypharmacy overdose stabilized.  AXIS IV:  Deferred.  AXIS V:  Current 52, past year not known.   PLAN:  Plan is to voluntarily admit him to our intensive care unit.  We  are going to talk to his mother and get some additional history, hear  how he has been functioning at home and explore his guardianship issues.  We will continue his current medications at this time.  He is on our  intensive care unit for close monitoring.      Margaret A. Lorin Picket, N.P.      Anselm Jungling, MD  Electronically Signed    MAS/MEDQ  D:  09/22/2008  T:  09/22/2008  Job:  [id number]
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
## 2010-07-23 NOTE — Discharge Summary (Signed)
NAME:  CANTON, YEARBY NO.:  [id number]   MEDICAL RECORD NO.:  [id number]          PATIENT TYPE:  IPS   LOCATION:  0406                          FACILITY:  [facility]   PHYSICIAN:  Anselm Jungling, MD  DATE OF BIRTH:  29-May-1985   DATE OF ADMISSION:  09/21/2008  DATE OF DISCHARGE:  09/25/2008                               DISCHARGE SUMMARY   IDENTIFYING DATA AND REASON FOR ADMISSION:  The patient is a 25 year old  single male, and this was 1 of several inpatient admissions for this  individual.  He returned to [place] with a history of bipolar disorder and  mild mental retardation.  He was admitted due to increasing mood  lability.  Prior to coming to the behavioral health center, he had  remained on the medical unit at [facility] for 16 days for  stabilization following an overdose of his prescription medications,  primarily Trileptal and Depakote.  Please refer to the admission note  for further details pertaining to his symptoms, circumstances and  history that led to his hospitalization.  He was given an initial Axis I  diagnosis of impulse control disorder NOS, and Axis II diagnosis of  mental retardation.   MEDICAL AND LABORATORY:  The patient was medically and physically  assessed by the psychiatric nurse practitioner.  This followed his 54-  day stay in the medical floor of [facility].  He came to [place]  with a history of seizure disorder with previous workup at [facility].  He was continued on his usual Depakote, and Trileptal, both  anticonvulsants, although they were most likely part of his medication  regimen previously due to their associated utility in treating unstable  mood disorders.  The patient was medically stable during his entire stay  in the inpatient psychiatry service.   HOSPITAL COURSE:  The patient was admitted to the adult inpatient  psychiatric service.  He presented as a well-nourished, normally-  developed physically,  young adult male who was generally pleasant and  cooperative, although in the past he has had episodic acting out of a  childlike, preadolescent developmental level nature.  Some of these  behaviors were in evidence immediately for his inpatient admission,  while on the medical floor at [facility].  He was generally  affable, familiar with our milieu, and glad to be there, instead of the  medical floor where he had been for some 16 days.   He was continued on his usual medication regimen of Zyprexa, Depakote,  Trileptal, Cogentin, and Celexa.   He was reasonably cooperative throughout, and he agreed to a family  session involving his mother and girlfriend, which occurred on the last  hospital day.  This went well, and discharge and aftercare plans were  discussed at length.  The patient and his family agreed to the following  aftercare plan.   AFTERCARE:  The patient was to follow up with Vira Agar at [facility] with an appointment on October 01, 2008 at 1:30 p.m.   DISCHARGE MEDICATIONS:  Zyprexa 10 mg  b.i.d. and 20 mg q.h.s., Depakote  1000 mg b.i.d., Trileptal 600 mg b.i.d., Cogentin 1 mg b.i.d., and  Celexa 20 mg daily.   DISCHARGE DIAGNOSES:  AXIS I: Bipolar disorder NOS by history.  AXIS II: Mild mental retardation.  AXIS III: History of seizure disorder.  AXIS IV: Stressors severe.  AXIS V: GAF on discharge 55.      Anselm Jungling, MD  Electronically Signed     SPB/MEDQ  D:  10/02/2008  T:  10/02/2008  Job:  [id number]

## 2010-07-23 NOTE — Discharge Summary (Signed)
Jack [name], Jack [name] NO.:  [id number]   MEDICAL RECORD NO.:  [id number]          PATIENT TYPE:  INP   LOCATION:  1513                         FACILITY:  [facility]   PHYSICIAN:  Peggye Pitt, M.D. DATE OF BIRTH:  February 16, 1986   DATE OF ADMISSION:  09/05/2008  DATE OF DISCHARGE:  09/21/2008                               DISCHARGE SUMMARY   ADDENDUM:  Please refer to prior discharge summary dictated on September 06, 2008 by myself.  Since then, no new diagnoses or medication changes have  been made.  The patient stayed an extra 2 weeks in the hospital because  of placement issues.  Finally, Behavioral Health had an opening in a  bed, and he was transferred to [facility] without complaints.   Again, please note discharge diagnoses and medications remain the same  as previously dictated summary.      Peggye Pitt, M.D.  Electronically Signed     EH/MEDQ  D:  09/30/2008  T:  09/30/2008  Job:  [id number]

## 2010-07-23 NOTE — Discharge Summary (Signed)
NAMEJOAOVICTOR, KRONE NO.:  [id number]   MEDICAL RECORD NO.:  [id number]          PATIENT TYPE:  INP   LOCATION:  1505                         FACILITY:  [facility]   PHYSICIAN:  Lonia Blood, M.D.      DATE OF BIRTH:  1985/09/06   DATE OF ADMISSION:  07/15/2008  DATE OF DISCHARGE:  07/20/2008                               DISCHARGE SUMMARY   ADDENDUM to discharge summary.   PRIMARY CARE PHYSICIAN:  The patient has no primary care physician.   Please refer to  interim discharge summary as dictated by Dr. Renee Ramus on Jul 18, 2008.  The patient was waiting for a bed at [facility] between Jul 18, 2008 and Jul 20, 2008.  He had multidrug  overdose and suicide attempt.  He also had history of schizophrenia.  During this period, he remained stable with a sitter on suicide  precaution.  On Jul 20, 2008 a bed became available at the [facility] and the patient was subsequently transferred.  His condition  remained stable.  He was medically cleared but remained suicidal.  Further treatment is to be administered at the [facility].      Lonia Blood, M.D.  Electronically Signed     LG/MEDQ  D:  09/30/2008  T:  09/30/2008  Job:  [id number]

## 2010-07-23 NOTE — Discharge Summary (Signed)
[facility]  Patient:    Jack [name], Jack [name] Visit Number: [id number] MRN: [id number]          Service Type: PSY Location: 200 0205 01 Attending Physician:  Veneta Penton. Dictated by:   Veneta Penton, M.D. Admit Date:  08/06/2001 Discharge Date: 08/16/2001                             Discharge Summary  REASON FOR ADMISSION:  This 25 year old African-American male was admitted for increasing symptoms of depression with a plan to cut his wrists.  For further history of present illness, please see the patients psychiatric admission assessment.  PHYSICAL EXAMINATION:  At the time of admission was significant for a history of a seizure disorder and a vocal and motor tic disorder, as well as borderline intellectual functioning versus mild mental retardation.  LABORATORY EXAMINATION:  The patient underwent a laboratory work-up to rule out any other medical problems contributing to his symptomatology.  A carbamazepine level was 7.6.  Valproic acid level was 90.5 on admission.  A GGT was within normal limits.  A urine probe for gonorrhea and chlamydia were negative.  A urine drug screen was negative.  Hepatic panel was within normal limits.  Basic metabolic panel was within normal limits.  CBC showed an MCHC of 35.0, platelet count of 118 thousand, and was otherwise unremarkable.  TSH and free T4 were unremarkable.  The patient received no x-rays, no special procedures, no additional consultations.  An RPR was nonreactive.  He sustained no complications during the course of this hospitalization.  HOSPITAL COURSE:  On admission, the patient was psychomotor agitated, showed significant cognitive processing deficits, vocal and motor tics.  He displayed poor impulse control, decreased concentration and attention span, was easily distracted by extraneous stimuli.  His affect and mood were depressed, irritable, angry and labile.  He was begun on a trial  of Remeron and titrated up to a therapeutic dose.  He was continued on Tegretol and Depakote at the admission dosage and was titrated upward on Clonidine to attempt to improve his tics and impulse control.  He continued to complain of vague visual hallucinations and was begun on a trial of Abilify.  He tolerated all of these medications well without side effects.  At the time of discharge, he denies any homicidal or suicidal ideation.  He is actively participating in all aspects of the therapeutic treatment program.  He remains somewhat hyperactive but is easily redirectable in the milieu.  He no longer appears to be a danger to himself or other, and consequently is felt to have reached his maximum benefits of hospitalization and is ready for discharge to a less restricted alternative setting.  CONDITION ON DISCHARGE:  Improved  FINAL DIAGNOSIS: Axis I:    1. Major depression, single episode, severe, without psychosis.            2. Rule out mood disorder secondary to seizure activity.            3. Attention deficit hyperactivity disorder.            4. Oppositional-defiant disorder.            5. Rule out conduct disorder.            6. Tourettes disorder.            7. Rule out complex motor tic disorder. Axis II:   1. Borderline  intellectual functioning.            2. Rule out mild mental retardation.            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 Axis III:  1. Seizure disorder.            2. Probable encephalopathy secondary seizure activity. Axis IV:   Current psychosocial stressors are severe. Axis V:    Code 20 on admission, code 30 on discharge.  FURTHER EVALUATION AND TREATMENT RECOMMENDATIONS: 1. An MRI of the brain was obtained which showed no acute brain    abnormality.  There was no evidence of infarct or gross intracranial    lesions.  The patient did have difficulty sitting still and the study    was not of the best quality.  An EEG showed some slowing of  brain wave    activity consistent with the patient taking his present medications and    showed no overt seizure activity. 2. He is discharged on an unrestricted level of activity and a regular diet. 3. He will follow up with [facility]    with his outpatient psychiatrist for all further aspects of his psychiatric    care and consequently I will sign off on the case at this time.  He will    follow up with his neurologist and primary care physician for all further    aspects of his medical care.  DISCHARGE MEDICATIONS: 1. Abilify 20 mg p.o. q.h.s. 2. Clonidine 0.1 mg p.o. q.i.d. 3. Remeron soltabs 30 mg p.o. q.h.s. 4. Depakote 500 mg in the morning and in the afternoon, and 750 mg q.h.s. 5. Tegretol 200 mg in the morning and the afternoon and 300 mg q.h.s. Dictated by:   Veneta Penton, M.D. Attending Physician:  Veneta Penton DD:  08/16/01 TD:  08/17/01 Job: [id number]

## 2010-07-24 ENCOUNTER — Emergency Department (HOSPITAL_COMMUNITY)
Admission: EM | Admit: 2010-07-24 | Discharge: 2010-07-24 | Disposition: A | Discharge status: Home / Self Care | Payer: Medicare Other | Attending: Emergency Medicine | Admitting: Emergency Medicine

## 2010-07-24 DIAGNOSIS — G40909 Epilepsy, unspecified, not intractable, without status epilepticus: Secondary | ICD-10-CM | POA: Insufficient documentation

## 2010-07-24 DIAGNOSIS — F411 Generalized anxiety disorder: Secondary | ICD-10-CM | POA: Insufficient documentation

## 2010-07-24 DIAGNOSIS — F79 Unspecified intellectual disabilities: Secondary | ICD-10-CM | POA: Insufficient documentation

## 2010-07-24 LAB — POCT I-STAT, CHEM 8
BUN: 11 mg/dL (ref 6–23)
Calcium, Ion: 1.25 mmol/L (ref 1.12–1.32)
Glucose, Bld: 77 mg/dL (ref 70–99)
TCO2: 30 mmol/L (ref 0–100)

## 2010-07-24 LAB — VALPROIC ACID LEVEL: Valproic Acid Lvl: 67 ug/mL (ref 50.0–100.0)

## 2010-09-26 ENCOUNTER — Emergency Department (HOSPITAL_COMMUNITY)
Admission: EM | Admit: 2010-09-26 | Discharge: 2010-09-26 | Discharge status: Against Medical Advice | Payer: Medicare Other | Attending: Emergency Medicine | Admitting: Emergency Medicine

## 2010-09-26 DIAGNOSIS — R569 Unspecified convulsions: Secondary | ICD-10-CM | POA: Insufficient documentation

## 2010-09-26 DIAGNOSIS — Z79899 Other long term (current) drug therapy: Secondary | ICD-10-CM | POA: Insufficient documentation

## 2010-10-17 ENCOUNTER — Emergency Department (HOSPITAL_COMMUNITY)
Admission: EM | Admit: 2010-10-17 | Discharge: 2010-10-17 | Disposition: A | Discharge status: Home / Self Care | Payer: Medicare Other | Attending: Emergency Medicine | Admitting: Emergency Medicine

## 2010-10-17 DIAGNOSIS — I10 Essential (primary) hypertension: Secondary | ICD-10-CM | POA: Insufficient documentation

## 2010-10-17 DIAGNOSIS — F319 Bipolar disorder, unspecified: Secondary | ICD-10-CM | POA: Insufficient documentation

## 2010-10-17 DIAGNOSIS — Z79899 Other long term (current) drug therapy: Secondary | ICD-10-CM | POA: Insufficient documentation

## 2010-10-17 DIAGNOSIS — E039 Hypothyroidism, unspecified: Secondary | ICD-10-CM | POA: Insufficient documentation

## 2010-10-17 DIAGNOSIS — F29 Unspecified psychosis not due to a substance or known physiological condition: Secondary | ICD-10-CM | POA: Insufficient documentation

## 2010-10-17 DIAGNOSIS — R51 Headache: Secondary | ICD-10-CM | POA: Insufficient documentation

## 2010-10-17 DIAGNOSIS — R404 Transient alteration of awareness: Secondary | ICD-10-CM | POA: Insufficient documentation

## 2010-10-17 DIAGNOSIS — G40909 Epilepsy, unspecified, not intractable, without status epilepticus: Secondary | ICD-10-CM | POA: Insufficient documentation

## 2010-10-17 LAB — VALPROIC ACID LEVEL: Valproic Acid Lvl: 99.9 ug/mL (ref 50.0–100.0)

## 2010-11-25 LAB — BASIC METABOLIC PANEL
BUN: 11
Calcium: 10.1
Creatinine, Ser: 0.87
GFR calc non Af Amer: >60
Glucose, Bld: 90
Potassium: 4.9

## 2010-11-25 LAB — RAPID URINE DRUG SCREEN, HOSP PERFORMED
Amphetamines: NOT DETECTED
Cocaine: NOT DETECTED
Tetrahydrocannabinol: NOT DETECTED

## 2010-11-25 LAB — CBC
HCT: 40.8
Platelets: 189
RDW: 12.8
WBC: 6.1

## 2010-11-25 LAB — DIFFERENTIAL
Basophils Absolute: 0
Lymphocytes Relative: 37
Lymphs Abs: 2.2
Neutro Abs: 3.3
Neutrophils Relative %: 54

## 2010-11-25 LAB — ETHANOL: Alcohol, Ethyl (B): <5

## 2010-12-16 LAB — VALPROIC ACID LEVEL: Valproic Acid Lvl: 88.7

## 2010-12-19 ENCOUNTER — Emergency Department (HOSPITAL_COMMUNITY)
Admission: EM | Admit: 2010-12-19 | Discharge: 2010-12-22 | Disposition: A | Discharge status: Home / Self Care | Payer: Medicare Other | Attending: Emergency Medicine | Admitting: Emergency Medicine

## 2010-12-19 DIAGNOSIS — Z046 Encounter for general psychiatric examination, requested by authority: Secondary | ICD-10-CM | POA: Insufficient documentation

## 2010-12-19 DIAGNOSIS — G40802 Other epilepsy, not intractable, without status epilepticus: Secondary | ICD-10-CM | POA: Insufficient documentation

## 2010-12-19 DIAGNOSIS — F319 Bipolar disorder, unspecified: Secondary | ICD-10-CM | POA: Insufficient documentation

## 2010-12-19 DIAGNOSIS — F79 Unspecified intellectual disabilities: Secondary | ICD-10-CM | POA: Insufficient documentation

## 2010-12-19 DIAGNOSIS — E039 Hypothyroidism, unspecified: Secondary | ICD-10-CM | POA: Insufficient documentation

## 2010-12-19 DIAGNOSIS — R45850 Homicidal ideations: Secondary | ICD-10-CM | POA: Insufficient documentation

## 2010-12-19 DIAGNOSIS — E669 Obesity, unspecified: Secondary | ICD-10-CM | POA: Insufficient documentation

## 2010-12-19 LAB — BASIC METABOLIC PANEL
BUN: 11 mg/dL (ref 6–23)
Chloride: 103 mEq/L (ref 96–112)
GFR calc Af Amer: >90 mL/min (ref >90)
GFR calc non Af Amer: >90 mL/min (ref >90)
Glucose, Bld: 113 mg/dL — ABNORMAL HIGH (ref 70–99)
Potassium: 3.9 mEq/L (ref 3.5–5.1)
Sodium: 139 mEq/L (ref 135–145)

## 2010-12-19 LAB — DIFFERENTIAL
Basophils Absolute: 0 10*3/uL (ref 0.0–0.1)
Eosinophils Relative: 0 % (ref 0–5)
Lymphocytes Relative: 43 % (ref 12–46)
Lymphs Abs: 2.9 10*3/uL (ref 0.7–4.0)
Neutro Abs: 3.3 10*3/uL (ref 1.7–7.7)
Neutrophils Relative %: 49 % (ref 43–77)

## 2010-12-19 LAB — RAPID URINE DRUG SCREEN, HOSP PERFORMED
Amphetamines: NOT DETECTED
Barbiturates: NOT DETECTED
Benzodiazepines: NOT DETECTED
Cocaine: NOT DETECTED

## 2010-12-19 LAB — CBC
HCT: 39.2 % (ref 39.0–52.0)
MCV: 89.9 fL (ref 78.0–100.0)
RBC: 4.36 MIL/uL (ref 4.22–5.81)
RDW: 12.5 % (ref 11.5–15.5)
WBC: 6.8 10*3/uL (ref 4.0–10.5)

## 2010-12-31 ENCOUNTER — Emergency Department (HOSPITAL_COMMUNITY)
Admission: EM | Admit: 2010-12-31 | Discharge: 2010-12-31 | Disposition: A | Discharge status: Home / Self Care | Payer: Medicare Other | Attending: Emergency Medicine | Admitting: Emergency Medicine

## 2010-12-31 DIAGNOSIS — F79 Unspecified intellectual disabilities: Secondary | ICD-10-CM | POA: Insufficient documentation

## 2010-12-31 DIAGNOSIS — E669 Obesity, unspecified: Secondary | ICD-10-CM | POA: Insufficient documentation

## 2010-12-31 DIAGNOSIS — Z79899 Other long term (current) drug therapy: Secondary | ICD-10-CM | POA: Insufficient documentation

## 2010-12-31 DIAGNOSIS — F319 Bipolar disorder, unspecified: Secondary | ICD-10-CM | POA: Insufficient documentation

## 2010-12-31 DIAGNOSIS — Z046 Encounter for general psychiatric examination, requested by authority: Secondary | ICD-10-CM | POA: Insufficient documentation

## 2010-12-31 DIAGNOSIS — G40802 Other epilepsy, not intractable, without status epilepticus: Secondary | ICD-10-CM | POA: Insufficient documentation

## 2010-12-31 DIAGNOSIS — E039 Hypothyroidism, unspecified: Secondary | ICD-10-CM | POA: Insufficient documentation

## 2010-12-31 DIAGNOSIS — R45851 Suicidal ideations: Secondary | ICD-10-CM | POA: Insufficient documentation

## 2010-12-31 LAB — DIFFERENTIAL
Basophils Relative: 0 % (ref 0–1)
Eosinophils Absolute: 0.1 10*3/uL (ref 0.0–0.7)
Eosinophils Relative: 1 % (ref 0–5)
Lymphs Abs: 4.5 10*3/uL — ABNORMAL HIGH (ref 0.7–4.0)
Monocytes Relative: 5 % (ref 3–12)
Neutrophils Relative %: 43 % (ref 43–77)

## 2010-12-31 LAB — COMPREHENSIVE METABOLIC PANEL
ALT: 8 U/L (ref 0–53)
AST: 14 U/L (ref 0–37)
Albumin: 4 g/dL (ref 3.5–5.2)
Calcium: 9.6 mg/dL (ref 8.4–10.5)
Creatinine, Ser: 0.81 mg/dL (ref 0.50–1.35)
GFR calc non Af Amer: >90 mL/min (ref >90)
Sodium: 136 mEq/L (ref 135–145)
Total Protein: 7.4 g/dL (ref 6.0–8.3)

## 2010-12-31 LAB — CBC
MCH: 29.6 pg (ref 26.0–34.0)
MCHC: 33.3 g/dL (ref 30.0–36.0)
MCV: 88.9 fL (ref 78.0–100.0)
Platelets: 171 10*3/uL (ref 150–400)
RDW: 12.4 % (ref 11.5–15.5)

## 2010-12-31 LAB — RAPID URINE DRUG SCREEN, HOSP PERFORMED
Amphetamines: NOT DETECTED
Benzodiazepines: NOT DETECTED
Cocaine: NOT DETECTED
Opiates: NOT DETECTED
Tetrahydrocannabinol: NOT DETECTED

## 2011-01-02 ENCOUNTER — Emergency Department (HOSPITAL_COMMUNITY)
Admission: EM | Admit: 2011-01-02 | Discharge: 2011-01-02 | Disposition: A | Discharge status: Other Acute Inpatient Hospital | Payer: Medicare Other | Attending: Emergency Medicine | Admitting: Emergency Medicine

## 2011-01-02 DIAGNOSIS — F410 Panic disorder [episodic paroxysmal anxiety] without agoraphobia: Secondary | ICD-10-CM | POA: Insufficient documentation

## 2011-01-02 DIAGNOSIS — E669 Obesity, unspecified: Secondary | ICD-10-CM | POA: Insufficient documentation

## 2011-01-02 DIAGNOSIS — Z79899 Other long term (current) drug therapy: Secondary | ICD-10-CM | POA: Insufficient documentation

## 2011-01-02 DIAGNOSIS — E039 Hypothyroidism, unspecified: Secondary | ICD-10-CM | POA: Insufficient documentation

## 2011-01-02 DIAGNOSIS — G40802 Other epilepsy, not intractable, without status epilepticus: Secondary | ICD-10-CM | POA: Insufficient documentation

## 2011-01-02 DIAGNOSIS — F319 Bipolar disorder, unspecified: Secondary | ICD-10-CM | POA: Insufficient documentation

## 2011-01-02 DIAGNOSIS — I10 Essential (primary) hypertension: Secondary | ICD-10-CM | POA: Insufficient documentation

## 2011-01-02 LAB — CBC
HCT: 40.7 % (ref 39.0–52.0)
Hemoglobin: 13.8 g/dL (ref 13.0–17.0)
MCH: 30.1 pg (ref 26.0–34.0)
MCHC: 33.9 g/dL (ref 30.0–36.0)
MCV: 88.9 fL (ref 78.0–100.0)
RDW: 12.3 % (ref 11.5–15.5)

## 2011-01-02 LAB — DIFFERENTIAL
Eosinophils Relative: 1 % (ref 0–5)
Lymphocytes Relative: 45 % (ref 12–46)
Monocytes Absolute: 0.4 10*3/uL (ref 0.1–1.0)
Monocytes Relative: 6 % (ref 3–12)
Neutro Abs: 3.3 10*3/uL (ref 1.7–7.7)

## 2011-01-02 LAB — COMPREHENSIVE METABOLIC PANEL
Albumin: 4.1 g/dL (ref 3.5–5.2)
Alkaline Phosphatase: 71 U/L (ref 39–117)
BUN: 11 mg/dL (ref 6–23)
Calcium: 9.9 mg/dL (ref 8.4–10.5)
Creatinine, Ser: 0.8 mg/dL (ref 0.50–1.35)
GFR calc Af Amer: >90 mL/min (ref >90)
Glucose, Bld: 88 mg/dL (ref 70–99)
Total Protein: 7.4 g/dL (ref 6.0–8.3)

## 2011-01-02 LAB — VALPROIC ACID LEVEL: Valproic Acid Lvl: 99.4 ug/mL (ref 50.0–100.0)

## 2011-01-02 LAB — RAPID URINE DRUG SCREEN, HOSP PERFORMED
Barbiturates: NOT DETECTED
Cocaine: NOT DETECTED
Tetrahydrocannabinol: NOT DETECTED

## 2011-01-02 LAB — URINALYSIS, ROUTINE W REFLEX MICROSCOPIC
Bilirubin Urine: NEGATIVE
Hgb urine dipstick: NEGATIVE
Ketones, ur: NEGATIVE mg/dL
Specific Gravity, Urine: 1.006 (ref 1.005–1.030)
pH: 7.5 (ref 5.0–8.0)

## 2011-12-19 ENCOUNTER — Emergency Department: Payer: Self-pay | Admitting: Emergency Medicine

## 2011-12-19 LAB — LITHIUM LEVEL: Lithium: 0.85 mmol/L

## 2011-12-19 LAB — CBC
HGB: 12.3 g/dL — ABNORMAL LOW (ref 13.0–18.0)
MCH: 31 pg (ref 26.0–34.0)
RBC: 3.95 10*6/uL — ABNORMAL LOW (ref 4.40–5.90)
WBC: 6.3 10*3/uL (ref 3.8–10.6)

## 2011-12-19 LAB — COMPREHENSIVE METABOLIC PANEL
Albumin: 3.9 g/dL (ref 3.4–5.0)
BUN: 6 mg/dL — ABNORMAL LOW (ref 7–18)
Creatinine: 0.88 mg/dL (ref 0.60–1.30)
EGFR (African American): >60
Glucose: 94 mg/dL (ref 65–99)
Potassium: 3.9 mmol/L (ref 3.5–5.1)
SGOT(AST): 19 U/L (ref 15–37)
SGPT (ALT): 11 U/L — ABNORMAL LOW (ref 12–78)
Total Protein: 7.6 g/dL (ref 6.4–8.2)

## 2011-12-19 LAB — URINALYSIS, COMPLETE
Bilirubin,UR: NEGATIVE
Nitrite: NEGATIVE
Protein: NEGATIVE
WBC UR: 5 /HPF (ref 0–5)

## 2011-12-19 LAB — ETHANOL
Ethanol %: <0.003 % (ref 0.000–0.080)
Ethanol: <3 mg/dL

## 2011-12-19 LAB — DRUG SCREEN, URINE
Barbiturates, Ur Screen: NEGATIVE (ref ?–200)
Cocaine Metabolite,Ur ~~LOC~~: NEGATIVE (ref ?–300)
Opiate, Ur Screen: NEGATIVE (ref ?–300)
Tricyclic, Ur Screen: NEGATIVE (ref ?–1000)

## 2011-12-20 LAB — DIFFERENTIAL
Basophil #: 0 x10 3/mm 3
Basophil %: 0.5 %
Eosinophil #: 0.1 x10 3/mm 3
Eosinophil %: 2 %
Lymphocyte %: 36.6 %
Lymphs Abs: 2.3 x10 3/mm 3
Monocyte #: 0.4 "x10 3/mm "
Monocyte %: 5.7 %
Neutrophil #: 3.5 x10 3/mm 3
Neutrophil %: 55.2 %

## 2012-01-10 DIAGNOSIS — F419 Anxiety disorder, unspecified: Secondary | ICD-10-CM | POA: Diagnosis present

## 2012-01-10 DIAGNOSIS — F250 Schizoaffective disorder, bipolar type: Secondary | ICD-10-CM | POA: Diagnosis present

## 2012-01-10 DIAGNOSIS — F79 Unspecified intellectual disabilities: Secondary | ICD-10-CM

## 2012-08-10 LAB — COMPREHENSIVE METABOLIC PANEL
Anion Gap: 4 — ABNORMAL LOW (ref 7–16)
BUN: 10 mg/dL (ref 7–18)
Co2: 28 mmol/L (ref 21–32)
Creatinine: 0.88 mg/dL (ref 0.60–1.30)
EGFR (African American): >60
Potassium: 3.9 mmol/L (ref 3.5–5.1)
SGPT (ALT): 15 U/L (ref 12–78)
Sodium: 139 mmol/L (ref 136–145)
Total Protein: 8.2 g/dL (ref 6.4–8.2)

## 2012-08-10 LAB — TSH: Thyroid Stimulating Horm: 3.02 u[IU]/mL

## 2012-08-10 LAB — DRUG SCREEN, URINE
Amphetamines, Ur Screen: NEGATIVE (ref ?–1000)
Barbiturates, Ur Screen: NEGATIVE (ref ?–200)
Benzodiazepine, Ur Scrn: NEGATIVE (ref ?–200)
Cocaine Metabolite,Ur ~~LOC~~: NEGATIVE (ref ?–300)
Methadone, Ur Screen: NEGATIVE (ref ?–300)
Opiate, Ur Screen: NEGATIVE (ref ?–300)
Phencyclidine (PCP) Ur S: NEGATIVE (ref ?–25)

## 2012-08-10 LAB — CBC
HCT: 38.6 % — ABNORMAL LOW (ref 40.0–52.0)
MCH: 30.2 pg (ref 26.0–34.0)
MCHC: 33.7 g/dL (ref 32.0–36.0)
MCV: 90 fL (ref 80–100)
Platelet: 183 10*3/uL (ref 150–440)
RDW: 13.3 % (ref 11.5–14.5)
WBC: 10.9 10*3/uL — ABNORMAL HIGH (ref 3.8–10.6)

## 2012-08-10 LAB — URINALYSIS, COMPLETE
Leukocyte Esterase: NEGATIVE
Nitrite: NEGATIVE
Ph: 5 (ref 4.5–8.0)
Specific Gravity: 1.031 (ref 1.003–1.030)
Squamous Epithelial: <1
WBC UR: 2 /HPF (ref 0–5)

## 2012-08-10 LAB — ETHANOL
Ethanol %: <0.003 % (ref 0.000–0.080)
Ethanol: <3 mg/dL

## 2012-08-11 ENCOUNTER — Inpatient Hospital Stay: Payer: Self-pay | Admitting: Psychiatry

## 2012-08-11 LAB — VALPROIC ACID LEVEL: Valproic Acid: 67 ug/mL

## 2012-08-11 LAB — DIFFERENTIAL
Basophil #: 0 10*3/uL (ref 0.0–0.1)
Basophil %: 0.2 %
Eosinophil #: 0.1 10*3/uL (ref 0.0–0.7)
Lymphocyte %: 25 %
Monocyte #: 0.6 x10 3/mm (ref 0.2–1.0)
Monocyte %: 5.5 %

## 2012-08-11 LAB — LITHIUM LEVEL: Lithium: 0.46 mmol/L — ABNORMAL LOW

## 2012-08-18 LAB — DIFFERENTIAL
Basophil #: 0 10*3/uL (ref 0.0–0.1)
Eosinophil %: 2.6 %
Lymphocyte #: 3.4 10*3/uL (ref 1.0–3.6)
Lymphocyte %: 41.5 %
Monocyte #: 0.6 x10 3/mm (ref 0.2–1.0)
Monocyte %: 6.9 %
Neutrophil #: 3.9 10*3/uL (ref 1.4–6.5)

## 2012-08-18 LAB — WBC: WBC: 8.1 10*3/uL (ref 3.8–10.6)

## 2012-08-19 LAB — BASIC METABOLIC PANEL
Anion Gap: 3 — ABNORMAL LOW (ref 7–16)
BUN: 11 mg/dL (ref 7–18)
Calcium, Total: 9 mg/dL (ref 8.5–10.1)
Chloride: 108 mmol/L — ABNORMAL HIGH (ref 98–107)
Creatinine: 0.94 mg/dL (ref 0.60–1.30)
EGFR (Non-African Amer.): >60
Glucose: 86 mg/dL (ref 65–99)
Osmolality: 282 (ref 275–301)
Potassium: 4 mmol/L (ref 3.5–5.1)

## 2012-08-19 LAB — LITHIUM LEVEL: Lithium: 0.65 mmol/L

## 2012-10-16 ENCOUNTER — Emergency Department: Payer: Self-pay | Admitting: Emergency Medicine

## 2012-10-16 LAB — URINALYSIS, COMPLETE
Blood: NEGATIVE
Protein: 30
Specific Gravity: 1.025 (ref 1.003–1.030)

## 2012-10-16 LAB — COMPREHENSIVE METABOLIC PANEL
Albumin: 4 g/dL (ref 3.4–5.0)
BUN: 10 mg/dL (ref 7–18)
Chloride: 105 mmol/L (ref 98–107)
Co2: 32 mmol/L (ref 21–32)
Glucose: 87 mg/dL (ref 65–99)
Osmolality: 280 (ref 275–301)
Potassium: 3.9 mmol/L (ref 3.5–5.1)
SGOT(AST): 16 U/L (ref 15–37)
SGPT (ALT): 15 U/L (ref 12–78)
Total Protein: 7.6 g/dL (ref 6.4–8.2)

## 2012-10-16 LAB — DRUG SCREEN, URINE
Amphetamines, Ur Screen: NEGATIVE (ref ?–1000)
MDMA (Ecstasy)Ur Screen: NEGATIVE (ref ?–500)
Methadone, Ur Screen: NEGATIVE (ref ?–300)
Opiate, Ur Screen: NEGATIVE (ref ?–300)

## 2012-10-16 LAB — VALPROIC ACID LEVEL: Valproic Acid: 82 ug/mL

## 2012-10-16 LAB — CBC
MCHC: 34.1 g/dL (ref 32.0–36.0)
Platelet: 168 10*3/uL (ref 150–440)
RBC: 4.37 10*6/uL — ABNORMAL LOW (ref 4.40–5.90)
RDW: 13.1 % (ref 11.5–14.5)

## 2012-10-16 LAB — DIFFERENTIAL
Basophil %: 0.3 %
Monocyte #: 0.3 x10 3/mm (ref 0.2–1.0)
Neutrophil #: 3.5 10*3/uL (ref 1.4–6.5)

## 2012-10-16 LAB — TSH: Thyroid Stimulating Horm: 2.28 u[IU]/mL

## 2012-10-16 LAB — LITHIUM LEVEL: Lithium: <0.2 mmol/L — ABNORMAL LOW

## 2012-10-22 LAB — VALPROIC ACID LEVEL: Valproic Acid: 108 ug/mL — ABNORMAL HIGH

## 2013-02-14 LAB — COMPREHENSIVE METABOLIC PANEL
Albumin: 4 g/dL (ref 3.4–5.0)
Anion Gap: 6 — ABNORMAL LOW (ref 7–16)
BUN: 7 mg/dL (ref 7–18)
Co2: 27 mmol/L (ref 21–32)
Creatinine: 0.87 mg/dL (ref 0.60–1.30)

## 2013-02-14 LAB — ACETAMINOPHEN LEVEL: Acetaminophen: <2 ug/mL

## 2013-02-14 LAB — DRUG SCREEN, URINE
Barbiturates, Ur Screen: NEGATIVE (ref ?–200)
Cocaine Metabolite,Ur ~~LOC~~: NEGATIVE (ref ?–300)
MDMA (Ecstasy)Ur Screen: NEGATIVE (ref ?–500)
Opiate, Ur Screen: NEGATIVE (ref ?–300)
Tricyclic, Ur Screen: NEGATIVE (ref ?–1000)

## 2013-02-14 LAB — URINALYSIS, COMPLETE
Bacteria: NONE SEEN
Glucose,UR: NEGATIVE mg/dL (ref 0–75)
Ketone: NEGATIVE
Nitrite: NEGATIVE
Ph: 6 (ref 4.5–8.0)
Protein: NEGATIVE

## 2013-02-14 LAB — ETHANOL
Ethanol %: <0.003 % (ref 0.000–0.080)
Ethanol: <3 mg/dL

## 2013-02-14 LAB — CBC
HCT: 36.3 % — ABNORMAL LOW (ref 40.0–52.0)
MCHC: 33.2 g/dL (ref 32.0–36.0)
MCV: 88 fL (ref 80–100)
RDW: 13.9 % (ref 11.5–14.5)

## 2013-02-15 ENCOUNTER — Inpatient Hospital Stay: Payer: Self-pay | Admitting: Psychiatry

## 2013-02-18 LAB — LITHIUM LEVEL: Lithium: 0.97 mmol/L

## 2013-02-18 LAB — VALPROIC ACID LEVEL: Valproic Acid: 84 ug/mL

## 2013-02-21 ENCOUNTER — Inpatient Hospital Stay: Payer: Self-pay | Admitting: Psychiatry

## 2013-02-21 LAB — CBC
HCT: 39 % — ABNORMAL LOW (ref 40.0–52.0)
HGB: 12.7 g/dL — ABNORMAL LOW (ref 13.0–18.0)
MCH: 29 pg (ref 26.0–34.0)
Platelet: 181 10*3/uL (ref 150–440)
WBC: 6.7 10*3/uL (ref 3.8–10.6)

## 2013-02-21 LAB — DRUG SCREEN, URINE
Amphetamines, Ur Screen: NEGATIVE (ref ?–1000)
Benzodiazepine, Ur Scrn: NEGATIVE (ref ?–200)
Cannabinoid 50 Ng, Ur ~~LOC~~: NEGATIVE (ref ?–50)
Cocaine Metabolite,Ur ~~LOC~~: NEGATIVE (ref ?–300)
MDMA (Ecstasy)Ur Screen: NEGATIVE (ref ?–500)
Methadone, Ur Screen: NEGATIVE (ref ?–300)
Opiate, Ur Screen: NEGATIVE (ref ?–300)
Phencyclidine (PCP) Ur S: NEGATIVE (ref ?–25)
Tricyclic, Ur Screen: NEGATIVE (ref ?–1000)

## 2013-02-21 LAB — COMPREHENSIVE METABOLIC PANEL
Albumin: 3.8 g/dL (ref 3.4–5.0)
Alkaline Phosphatase: 68 U/L
Anion Gap: 2 — ABNORMAL LOW (ref 7–16)
Bilirubin,Total: 0.3 mg/dL (ref 0.2–1.0)
Creatinine: 0.88 mg/dL (ref 0.60–1.30)
Glucose: 107 mg/dL — ABNORMAL HIGH (ref 65–99)
Potassium: 4.3 mmol/L (ref 3.5–5.1)
Total Protein: 7.7 g/dL (ref 6.4–8.2)

## 2013-02-21 LAB — VALPROIC ACID LEVEL: Valproic Acid: 102 ug/mL — ABNORMAL HIGH

## 2013-02-21 LAB — URINALYSIS, COMPLETE
Bilirubin,UR: NEGATIVE
Blood: NEGATIVE
Nitrite: NEGATIVE
RBC,UR: 1 /HPF (ref 0–5)
Specific Gravity: 1.017 (ref 1.003–1.030)
Squamous Epithelial: <1
WBC UR: 1 /HPF (ref 0–5)

## 2013-02-21 LAB — TSH: Thyroid Stimulating Horm: 2.33 u[IU]/mL

## 2013-02-21 LAB — LITHIUM LEVEL: Lithium: 0.58 mmol/L — ABNORMAL LOW

## 2013-02-21 LAB — ETHANOL
Ethanol %: <0.003 % (ref 0.000–0.080)
Ethanol: <3 mg/dL

## 2013-02-22 LAB — AMMONIA: Ammonia, Plasma: 69 mcmol/L — ABNORMAL HIGH (ref 11–32)

## 2013-03-05 LAB — DIFFERENTIAL
Basophil #: 0 10*3/uL (ref 0.0–0.1)
Basophil %: 0.3 %
Eosinophil %: 1.8 %
Lymphocyte #: 1.4 10*3/uL (ref 1.0–3.6)
Lymphocyte %: 13.9 %
Monocyte %: 6.7 %
Neutrophil #: 7.9 10*3/uL — ABNORMAL HIGH (ref 1.4–6.5)
Neutrophil %: 77.3 %

## 2013-03-05 LAB — WBC: WBC: 10.2 10*3/uL (ref 3.8–10.6)

## 2013-03-20 LAB — URINALYSIS, COMPLETE
BILIRUBIN, UR: NEGATIVE
Blood: NEGATIVE
Glucose,UR: 50 mg/dL (ref 0–75)
Hyaline Cast: 1
Leukocyte Esterase: NEGATIVE
NITRITE: NEGATIVE
Ph: 6 (ref 4.5–8.0)
Protein: 30
SPECIFIC GRAVITY: 1.024 (ref 1.003–1.030)
Squamous Epithelial: NONE SEEN
WBC UR: 4 /HPF (ref 0–5)

## 2013-03-20 LAB — CBC
HCT: 36.3 % — ABNORMAL LOW (ref 40.0–52.0)
HGB: 12.3 g/dL — ABNORMAL LOW (ref 13.0–18.0)
MCH: 30.3 pg (ref 26.0–34.0)
MCHC: 33.9 g/dL (ref 32.0–36.0)
MCV: 90 fL (ref 80–100)
Platelet: 163 10*3/uL (ref 150–440)
RBC: 4.05 10*6/uL — ABNORMAL LOW (ref 4.40–5.90)
RDW: 12.9 % (ref 11.5–14.5)
WBC: 5.1 10*3/uL (ref 3.8–10.6)

## 2013-03-20 LAB — DRUG SCREEN, URINE

## 2013-03-20 LAB — ETHANOL
Ethanol %: <0.003 % (ref 0.000–0.080)
Ethanol: <3 mg/dL

## 2013-03-20 LAB — COMPREHENSIVE METABOLIC PANEL
ALK PHOS: 71 U/L
Albumin: 3.8 g/dL (ref 3.4–5.0)
Anion Gap: 5 — ABNORMAL LOW (ref 7–16)
BUN: 7 mg/dL (ref 7–18)
Bilirubin,Total: 0.2 mg/dL (ref 0.2–1.0)
Calcium, Total: 8.8 mg/dL (ref 8.5–10.1)
Chloride: 107 mmol/L (ref 98–107)
Co2: 27 mmol/L (ref 21–32)
Creatinine: 1.01 mg/dL (ref 0.60–1.30)
EGFR (African American): >60
EGFR (Non-African Amer.): >60
GLUCOSE: 118 mg/dL — AB (ref 65–99)
Osmolality: 277 (ref 275–301)
Potassium: 3.9 mmol/L (ref 3.5–5.1)
SGOT(AST): 20 U/L (ref 15–37)
SGPT (ALT): 16 U/L (ref 12–78)
Sodium: 139 mmol/L (ref 136–145)
TOTAL PROTEIN: 7.2 g/dL (ref 6.4–8.2)

## 2013-03-20 LAB — VALPROIC ACID LEVEL: VALPROIC ACID: 100 ug/mL

## 2013-03-20 LAB — LITHIUM LEVEL: Lithium: 0.44 mmol/L — ABNORMAL LOW

## 2013-03-20 LAB — ACETAMINOPHEN LEVEL: Acetaminophen: <2 ug/mL

## 2013-03-20 LAB — SALICYLATE LEVEL: Salicylates, Serum: <1.7 mg/dL

## 2013-03-20 LAB — TSH: THYROID STIMULATING HORM: 2.15 u[IU]/mL

## 2013-03-21 LAB — DIFFERENTIAL
BASOS PCT: 0.4 %
Basophil #: 0 10*3/uL (ref 0.0–0.1)
EOS PCT: 1.6 %
Eosinophil #: 0.1 10*3/uL (ref 0.0–0.7)
LYMPHS PCT: 35.9 %
Lymphocyte #: 1.8 10*3/uL (ref 1.0–3.6)
Monocyte #: 0.5 x10 3/mm (ref 0.2–1.0)
Monocyte %: 10 %
Neutrophil #: 2.6 10*3/uL (ref 1.4–6.5)
Neutrophil %: 52.1 %

## 2013-03-22 ENCOUNTER — Inpatient Hospital Stay: Payer: Self-pay | Admitting: Psychiatry

## 2013-03-27 LAB — DIFFERENTIAL
Basophil #: 0 10*3/uL (ref 0.0–0.1)
Basophil %: 0.6 %
Eosinophil #: 0.2 10*3/uL (ref 0.0–0.7)
Eosinophil %: 2.6 %
LYMPHS ABS: 2.7 10*3/uL (ref 1.0–3.6)
LYMPHS PCT: 42.7 %
Monocyte #: 0.6 x10 3/mm (ref 0.2–1.0)
Monocyte %: 9.1 %
Neutrophil #: 2.8 10*3/uL (ref 1.4–6.5)
Neutrophil %: 45 %

## 2013-03-27 LAB — WBC: WBC: 6.3 10*3/uL (ref 3.8–10.6)

## 2013-03-28 LAB — LITHIUM LEVEL: LITHIUM: 1.24 mmol/L — AB

## 2013-04-03 LAB — DIFFERENTIAL
BASOS PCT: 0.7 %
Basophil #: 0 10*3/uL (ref 0.0–0.1)
EOS PCT: 2.9 %
Eosinophil #: 0.2 10*3/uL (ref 0.0–0.7)
LYMPHS PCT: 43.2 %
Lymphocyte #: 3.2 10*3/uL (ref 1.0–3.6)
MONO ABS: 0.4 x10 3/mm (ref 0.2–1.0)
MONOS PCT: 6.1 %
NEUTROS ABS: 3.5 10*3/uL (ref 1.4–6.5)
Neutrophil %: 47.1 %

## 2013-04-03 LAB — WBC: WBC: 7.4 10*3/uL (ref 3.8–10.6)

## 2013-04-08 LAB — LITHIUM LEVEL: Lithium: 1.1 mmol/L

## 2013-04-12 ENCOUNTER — Emergency Department: Payer: Self-pay | Admitting: Emergency Medicine

## 2013-04-12 LAB — COMPREHENSIVE METABOLIC PANEL
ALBUMIN: 4.3 g/dL (ref 3.4–5.0)
Alkaline Phosphatase: 89 U/L
Anion Gap: 4 — ABNORMAL LOW (ref 7–16)
BILIRUBIN TOTAL: 0.5 mg/dL (ref 0.2–1.0)
BUN: 12 mg/dL (ref 7–18)
CHLORIDE: 108 mmol/L — AB (ref 98–107)
CO2: 29 mmol/L (ref 21–32)
CREATININE: 1.04 mg/dL (ref 0.60–1.30)
Calcium, Total: 9.4 mg/dL (ref 8.5–10.1)
GLUCOSE: 93 mg/dL (ref 65–99)
OSMOLALITY: 281 (ref 275–301)
POTASSIUM: 3.3 mmol/L — AB (ref 3.5–5.1)
SGOT(AST): 24 U/L (ref 15–37)
SGPT (ALT): 16 U/L (ref 12–78)
Sodium: 141 mmol/L (ref 136–145)
TOTAL PROTEIN: 8.1 g/dL (ref 6.4–8.2)

## 2013-04-12 LAB — CBC
HCT: 40.6 % (ref 40.0–52.0)
HGB: 13.6 g/dL (ref 13.0–18.0)
MCH: 30.2 pg (ref 26.0–34.0)
MCHC: 33.5 g/dL (ref 32.0–36.0)
MCV: 90 fL (ref 80–100)
PLATELETS: 201 10*3/uL (ref 150–440)
RBC: 4.51 10*6/uL (ref 4.40–5.90)
RDW: 12.5 % (ref 11.5–14.5)
WBC: 6.6 10*3/uL (ref 3.8–10.6)

## 2013-04-12 LAB — DRUG SCREEN, URINE
Amphetamines, Ur Screen: NEGATIVE (ref ?–1000)
BENZODIAZEPINE, UR SCRN: NEGATIVE (ref ?–200)
Barbiturates, Ur Screen: NEGATIVE (ref ?–200)
COCAINE METABOLITE, UR ~~LOC~~: NEGATIVE (ref ?–300)
Cannabinoid 50 Ng, Ur ~~LOC~~: NEGATIVE (ref ?–50)
MDMA (Ecstasy)Ur Screen: NEGATIVE (ref ?–500)
Methadone, Ur Screen: NEGATIVE (ref ?–300)
OPIATE, UR SCREEN: NEGATIVE (ref ?–300)
Phencyclidine (PCP) Ur S: NEGATIVE (ref ?–25)
Tricyclic, Ur Screen: NEGATIVE (ref ?–1000)

## 2013-04-12 LAB — URINALYSIS, COMPLETE
BILIRUBIN, UR: NEGATIVE
Blood: NEGATIVE
Glucose,UR: NEGATIVE mg/dL (ref 0–75)
LEUKOCYTE ESTERASE: NEGATIVE
Nitrite: NEGATIVE
Ph: 5 (ref 4.5–8.0)
RBC,UR: NONE SEEN /HPF (ref 0–5)
SPECIFIC GRAVITY: 1.02 (ref 1.003–1.030)
WBC UR: NONE SEEN /HPF (ref 0–5)

## 2013-04-12 LAB — ETHANOL: Ethanol: <3 mg/dL

## 2013-04-12 LAB — SALICYLATE LEVEL: Salicylates, Serum: <1.7 mg/dL

## 2013-04-12 LAB — DIFFERENTIAL
BASOS PCT: 0.4 %
Basophil #: 0 10*3/uL (ref 0.0–0.1)
Eosinophil #: 0 10*3/uL (ref 0.0–0.7)
Eosinophil %: 0.5 %
Lymphocyte #: 2 10*3/uL (ref 1.0–3.6)
Lymphocyte %: 30.7 %
MONO ABS: 0.3 x10 3/mm (ref 0.2–1.0)
Monocyte %: 4.8 %
Neutrophil #: 4.2 10*3/uL (ref 1.4–6.5)
Neutrophil %: 63.6 %

## 2013-04-12 LAB — ACETAMINOPHEN LEVEL: Acetaminophen: <2 ug/mL

## 2013-04-12 LAB — LITHIUM LEVEL: Lithium: 0.4 mmol/L — ABNORMAL LOW

## 2013-04-21 ENCOUNTER — Emergency Department: Payer: Self-pay | Admitting: Emergency Medicine

## 2013-04-21 LAB — CBC
HCT: 39.1 % — AB (ref 40.0–52.0)
HGB: 13.3 g/dL (ref 13.0–18.0)
MCH: 30.2 pg (ref 26.0–34.0)
MCHC: 34.1 g/dL (ref 32.0–36.0)
MCV: 89 fL (ref 80–100)
PLATELETS: 168 10*3/uL (ref 150–440)
RBC: 4.41 10*6/uL (ref 4.40–5.90)
RDW: 12.6 % (ref 11.5–14.5)
WBC: 9.3 10*3/uL (ref 3.8–10.6)

## 2013-04-21 LAB — COMPREHENSIVE METABOLIC PANEL
ALK PHOS: 86 U/L
Albumin: 4 g/dL (ref 3.4–5.0)
Anion Gap: 6 — ABNORMAL LOW (ref 7–16)
BUN: 8 mg/dL (ref 7–18)
Bilirubin,Total: 0.4 mg/dL (ref 0.2–1.0)
CHLORIDE: 107 mmol/L (ref 98–107)
CREATININE: 0.87 mg/dL (ref 0.60–1.30)
Calcium, Total: 9.6 mg/dL (ref 8.5–10.1)
Co2: 26 mmol/L (ref 21–32)
EGFR (Non-African Amer.): >60
Glucose: 89 mg/dL (ref 65–99)
Osmolality: 275 (ref 275–301)
Potassium: 3.6 mmol/L (ref 3.5–5.1)
SGOT(AST): 16 U/L (ref 15–37)
SGPT (ALT): 12 U/L (ref 12–78)
SODIUM: 139 mmol/L (ref 136–145)
TOTAL PROTEIN: 7.5 g/dL (ref 6.4–8.2)

## 2013-04-21 LAB — URINALYSIS, COMPLETE
BACTERIA: NONE SEEN
BILIRUBIN, UR: NEGATIVE
Blood: NEGATIVE
GLUCOSE, UR: NEGATIVE mg/dL (ref 0–75)
Ketone: NEGATIVE
LEUKOCYTE ESTERASE: NEGATIVE
Nitrite: NEGATIVE
PH: 6 (ref 4.5–8.0)
Protein: NEGATIVE
RBC,UR: NONE SEEN /HPF (ref 0–5)
SPECIFIC GRAVITY: 1.016 (ref 1.003–1.030)
Squamous Epithelial: NONE SEEN

## 2013-04-21 LAB — DRUG SCREEN, URINE

## 2013-04-21 LAB — SALICYLATE LEVEL

## 2013-04-21 LAB — ETHANOL
Ethanol %: <0.003 % (ref 0.000–0.080)
Ethanol: <3 mg/dL

## 2013-04-21 LAB — ACETAMINOPHEN LEVEL

## 2013-04-21 LAB — LITHIUM LEVEL: Lithium: 0.4 mmol/L — ABNORMAL LOW

## 2013-08-19 LAB — CBC
HCT: 39.7 % — ABNORMAL LOW (ref 40.0–52.0)
HGB: 13.1 g/dL (ref 13.0–18.0)
MCH: 29.1 pg (ref 26.0–34.0)
MCHC: 33 g/dL (ref 32.0–36.0)
MCV: 88 fL (ref 80–100)
PLATELETS: 188 10*3/uL (ref 150–440)
RBC: 4.5 10*6/uL (ref 4.40–5.90)
RDW: 13.9 % (ref 11.5–14.5)
WBC: 7.3 10*3/uL (ref 3.8–10.6)

## 2013-08-19 LAB — COMPREHENSIVE METABOLIC PANEL
ALK PHOS: 78 U/L
AST: 15 U/L (ref 15–37)
Albumin: 4.4 g/dL (ref 3.4–5.0)
Anion Gap: 7 (ref 7–16)
BILIRUBIN TOTAL: 0.4 mg/dL (ref 0.2–1.0)
BUN: 6 mg/dL — ABNORMAL LOW (ref 7–18)
CALCIUM: 9.5 mg/dL (ref 8.5–10.1)
CO2: 26 mmol/L (ref 21–32)
Chloride: 106 mmol/L (ref 98–107)
Creatinine: 1.01 mg/dL (ref 0.60–1.30)
EGFR (African American): >60
EGFR (Non-African Amer.): >60
GLUCOSE: 83 mg/dL (ref 65–99)
Osmolality: 274 (ref 275–301)
Potassium: 3.3 mmol/L — ABNORMAL LOW (ref 3.5–5.1)
SGPT (ALT): 11 U/L — ABNORMAL LOW (ref 12–78)
Sodium: 139 mmol/L (ref 136–145)
TOTAL PROTEIN: 7.8 g/dL (ref 6.4–8.2)

## 2013-08-19 LAB — SALICYLATE LEVEL

## 2013-08-19 LAB — LITHIUM LEVEL: Lithium: 0.74 mmol/L

## 2013-08-19 LAB — ETHANOL
Ethanol %: <0.003 % (ref 0.000–0.080)
Ethanol: <3 mg/dL

## 2013-08-19 LAB — ACETAMINOPHEN LEVEL: Acetaminophen: <2 ug/mL

## 2013-08-20 LAB — DIFFERENTIAL
BASOS ABS: 0 10*3/uL (ref 0.0–0.1)
Basophil %: 0.3 %
EOS PCT: 1.6 %
Eosinophil #: 0.1 10*3/uL (ref 0.0–0.7)
Lymphocyte #: 1.9 10*3/uL (ref 1.0–3.6)
Lymphocyte %: 26.7 %
Monocyte #: 0.5 x10 3/mm (ref 0.2–1.0)
Monocyte %: 7.1 %
NEUTROS ABS: 4.7 10*3/uL (ref 1.4–6.5)
Neutrophil %: 64.3 %

## 2013-08-21 ENCOUNTER — Inpatient Hospital Stay: Payer: Self-pay | Admitting: Psychiatry

## 2013-08-22 LAB — URINALYSIS, COMPLETE
BACTERIA: NONE SEEN
BILIRUBIN, UR: NEGATIVE
Blood: NEGATIVE
GLUCOSE, UR: NEGATIVE mg/dL (ref 0–75)
Leukocyte Esterase: NEGATIVE
NITRITE: NEGATIVE
PH: 7 (ref 4.5–8.0)
Protein: NEGATIVE
Specific Gravity: 1.016 (ref 1.003–1.030)
Squamous Epithelial: NONE SEEN
WBC UR: 1 /HPF (ref 0–5)

## 2013-08-23 LAB — DRUG SCREEN, URINE

## 2013-08-26 LAB — DIFFERENTIAL
BASOS ABS: 0 10*3/uL (ref 0.0–0.1)
Basophil %: 0.3 %
EOS ABS: 0.2 10*3/uL (ref 0.0–0.7)
Eosinophil %: 2.3 %
Lymphocyte #: 2.5 10*3/uL (ref 1.0–3.6)
Lymphocyte %: 30.2 %
Monocyte #: 0.9 x10 3/mm (ref 0.2–1.0)
Monocyte %: 10.7 %
NEUTROS ABS: 4.6 10*3/uL (ref 1.4–6.5)
NEUTROS PCT: 56.5 %

## 2013-08-26 LAB — WBC: WBC: 8.2 10*3/uL (ref 3.8–10.6)

## 2013-08-27 LAB — VALPROIC ACID LEVEL: Valproic Acid: 85 ug/mL

## 2013-08-27 LAB — LITHIUM LEVEL: Lithium: 0.92 mmol/L

## 2014-05-05 ENCOUNTER — Emergency Department: Payer: Self-pay | Admitting: Emergency Medicine

## 2014-06-24 NOTE — Consult Note (Signed)
PATIENT NAME:  Jack BrookeBALLARD, Cowan MR#:  [id number] DATE OF BIRTH:  06/13/1985  DATE OF CONSULTATION:  12/21/2011  REFERRING PHYSICIAN:  Dr. Cyril LoosenKinner  CONSULTING PHYSICIAN:  Venida JarvisWilliam James Brinna Divelbiss II, MD  REASON FOR CONSULTATION: For disposition.    CHIEF COMPLAINT: "I am okay."  HISTORY OF PRESENT ILLNESS: Patient is a 29 year old African American male with developmental disability and a history of bipolar disorder. He got frustrated at the group home. He was reading the Bible and apparently may be not have understanding but got obsessed some with demons and got frustrated with another resident, began hitting himself in the head and chest telling them he planned to kill himself. More recently he has been more preoccupied with religion and he has been out of control some recently.    Currently since coming to the hospital two days ago the patient has calmed down some. Currently denies suicidal thoughts, denies demons and reports that he is no longer out of control. He was [facility] and they did confirm that he was back to his usual self and that he was not going to kill himself.   PAST MEDICAL HISTORY: Patient has seizure disorder.   REVIEW OF SYSTEMS: According to patient currently at this time is negative.   MENTAL STATUS: Black male, obviously of low intelligence. He did not appear to be depressed. He was not agitated. He was able to give me a fair history. He was well groomed and aware of his surroundings. He was oriented x4. Did not know the president. Remembered one of three objects at one minute, then three of three objects at one and three minutes.   SOCIAL HISTORY: He is DD, lives at a group home and is followed by mental health.   DIAGNOSES:  AXIS I:  1. Bipolar disorder with recent minor episode of anger with some depression. 2. History of attention deficit/hyperactivity disorder.   AXIS III: Seizure disorder.   AXIS IV: Severe psychiatric and  developmental disability.   AXIS V: Currently is 45.   CURRENT MEDICATIONS: 1. Lithium 300 one by mouth daily.  2. Fanapt 4 mg 1/2 tablet in the morning and 1 tablet at bedtime.  3. Colace 100 mg twice a day. 4. Depakote ER 3 times a day. 5. Lithium 300, 3 times a day. 6. Clozaril 150 mg at bedtime.   SUICIDE RISK ASSESSMENT: Current attempt no true attempt. Impulsivity positive. Ideational plan negative. Panic negative. High anxiety turmoil positive. Anhedonia negative. Concentration negative. Hopelessness negative. Insomnia negative. Energy negative. Treatment alliance negative. Illness, pain positive. Prior attempts positive. Substance abuse negative. Family history negative. Childhood abuse is questionable. Marital status positive. Family and children positive. Firearms negative. Age greater than 65 negative. Gender male is positive. Short-term suicide risk is low, long-term is moderate.   PLAN: Patient should be able to return to group home which he wants to do, continue on his present medication and release from IVC and will follow up at mental health.   ____________________________ Venida JarvisWilliam James Boston Cookson II, MD wjr:cms D: 12/21/2011 13:08:44 ET T: 12/21/2011 13:48:15 ET JOB#: [id number]  cc: Venida JarvisWilliam James Kalenna Millett II, MD, <Dictator> Jules HusbandsWILLIAM J Pressley Barsky MD ELECTRONICALLY SIGNED 12/21/2011 16:07

## 2014-06-27 NOTE — Consult Note (Signed)
Brief Consult Note: Diagnosis: Schizoaffective disorder, mild MR.   Patient was seen by consultant.   Consult note dictated.   Recommend further assessment or treatment.   Orders entered.   Comments: Mr. Marissa CalamityBallard was brought to ER suicidal in the context of conflict at the group home and partial medication compliance.  PLAN: 1. He no longer meets criteria for IVC. I will terminate proceedings. Please discharge as appropriate.  2. He is to continue all medications as prescribed by Dr. Barnett AbuWiseman. Will add Ativan as neded for anxiety. Rx written.  3. F/U with Dr. Barnett AbuWiseman on 09/03 at 12:30.  4. [facility]  5. Group home will pick him up.  Electronic Signatures: Kristine LineaPucilowska, Atreus Hasz (MD)  (Signed [phone]-Aug-14 20:55)  Authored: Brief Consult Note   Last Updated: 18-Aug-14 20:55 by Kristine LineaPucilowska, Mesa Janus (MD)

## 2014-06-27 NOTE — H&P (Signed)
PATIENT NAME:  Jack [name], Jack [name] MR#:  [id number] DATE OF BIRTH:  11/26/1985  DATE OF ADMISSION:  02/14/2013.  DATE OF ADMISSION:  02/15/2013.  REFERRING PHYSICIAN:  Jene Every, MD.  ATTENDING PHYSICIAN:  Kristine Linea, M.D.   IDENTIFYING DATA:  Jack [name] is a 29 year old male with a history of schizoaffective disorder and borderline cognitive functioning.   CHIEF COMPLAINT:  "I am calm now".   HISTORY OF PRESENT ILLNESS:  Jack [name] is a resident of a group home. He has been doing well there, stable on his medications; however, he gets upset easily. Yesterday he felt that Simonne Come, one of the staff members, was pushing his buttons. He became agitated and aggressive towards the staff member. He also grabbed a kitchen knife and "tried to kill himself " and also threatened to the hurt Simonne Come. It is unclear whether he was only verbally aggressive towards the staff member or physically aggressive. Florentina Jenny managed to wrestle the knife away from the patient. The patient explained that he had been hearing the devil's voice in his head telling him to kill himself. Following this episode, the patient was on the floor, hitting his head on the floor. The patient does not remember whether he was trying to hurt himself or possibly he could have had a seizure episode. He seems not to remember much and most of the information is obtained from see his chart, a petition and interview with the staff. The patient was brought to the Emergency Room. He has been cool and collected, no behavioral problems. He still says that at times he hears the voice of Prudy Feeler; however, most of the time, the patient is preoccupied with religious positive things. He usually has commands telling him to preach. He sometimes feels like Jesus or God, himself. He does have a history of angry outbursts with property damage but I do not believe that he has a history of aggression. The patient has been maintained on the combination of Clozaril,  Depakote, lithium and Lamictal. He is compliant with medications as indicated by most of the time therapeutic levels. His lithium is a little low this time around. The patient does not report any symptoms of depression except for easy irritability, poor sleep, low energy and anxiety when he hears the voice of Satan. Most of the time he feels that he is in a good mood but sometimes people can "push his buttons". He denies symptoms suggestive of bipolar mania. He is not using alcohol or illicit substance or prescription pills.   PAST PSYCHIATRIC HISTORY: The patient has prior hospitalizations to [facility]. He was admitted here at the beginning of June 2014 for exacerbation of psychosis in the course of schizoaffective disorder. He is an incompetent adult, and his mother is the guardian. There are no suicide attempts.   FAMILY PSYCHIATRIC HISTORY:  None reported.   PAST MEDICAL HISTORY:  Seizure disorder.   ALLERGIES:  No known drug allergies.   MEDICATIONS ON ADMISSION: 1.  Clozaril 100 mg at 1700, 400 mg at night.  2.  Depakote 500, 3 times daily.  3.  Colace 100 mg twice daily.  4.  Lamictal 25 mg twice daily.  5.  Lithium at 600 mg at bedtime.  6.  Keppra 500 mg 3 times daily.   SOCIAL HISTORY:  He is disabled from mental illness. There is some cognitive deficit. He lives in a group home. His mother is his guardian.  REVIEW OF SYSTEMS:  CONSTITUTIONAL:  No fevers or  chills. No weight changes.  EYES:  No double or blurred vision.  ENT:  No hearing loss.  RESPIRATORY:  No shortness of breath or cough.  CARDIOVASCULAR:  No chest pain or orthopnea.  GASTROINTESTINAL:  No abdominal pain, nausea, vomiting, or diarrhea.  GENITOURINARY:  No incontinence or frequency.  ENDOCRINE:  No heat or cold intolerance.  LYMPHATIC:  No anemia or easy bruising.  INTEGUMENTARY:  No acne or rash.  MUSCULOSKELETAL:  No muscle or joint pain.  NEUROLOGIC:  No tingling or weakness.  PSYCHIATRIC:   See history of present illness for details.    PHYSICAL EXAMINATION:  VITAL SIGNS:  Blood pressure 95/52, pulse 83, respirations 18, temperature 98.6. GENERAL:  A well-developed male in no acute distress.  HEENT:  The pupils are equal, round, and reactive to light. Sclerae anicteric.  NECK:  Supple. No thyromegaly.  LUNGS:  Clear to auscultation. No dullness to percussion.  HEART:  Regular rhythm and rate. No murmurs, rubs, or gallops.  ABDOMEN:  Soft, nontender, nondistended. Positive bowel sounds.  MUSCULOSKELETAL:  Normal muscle strength in all extremities.  SKIN:  No rashes or bruises.  LYMPHATIC:  No cervical adenopathy.  NEUROLOGIC:  Cranial nerves II through XII are intact.   LABORATORY DATA:  Chemistries are within normal limits except for blood glucose of 128. Blood alcohol level is 0. LFTs within normal limits. Lithium level is 0.38. Depakote 105. Urine tox screen negative for substances. CBC within normal limits. Urinalysis is not suggestive of urinary tract infection. Serum acetaminophen and salicylates are low.   MENTAL STATUS EXAMINATION ON ADMISSION:  The patient is alert and oriented to person, place, time and situation, although he does not remember the details of the events leading to his admission. He is pleasant, polite and cooperative. There is some psychomotor retardation. He is well groomed. He is wearing hospital scrubs. He maintains good eye contact. His speech is of normal rhythm, rate and volume. Mood is calm with a flat affect. Thought process is logical. There is poverty of thought. He denies suicidal or homicidal ideation, but does admit to threatening suicide with a knife in response to command hallucinations. There are no thoughts of hurting others. There are religious preoccupations. He talks to God and Jesus. He wants to be a Programmer, multimedia. There are auditory hallucinations. His cognition is grossly intact but difficult to assess. His insight and judgment are limited.    SUICIDE RISK ASSESSMENT ON ADMISSION:  This is a patient with history of psychosis, mood instability and at the moment, command hallucinations directing him to hurt himself. He is at increased risk of suicide.   DIAGNOSES:  AXIS I: Schizoaffective disorder, bipolar type.  AXIS II: Borderline intellectual functioning.  AXIS III: Seizure disorder.  AXIS IV: Mental and physical illness, conflict at the group home, primary support, coping skills.  AXIS V: Global assessment of functioning 25.   PLAN:  The patient was admitted to [facility] for safety stabilization and medication management. He was initially placed on suicide precautions and was closely monitored for any unsafe behaviors. He underwent full psychiatric and risk assessment. He received pharmacotherapy, individual and group psychotherapy, substance abuse counseling, and support from therapeutic milieu.  1.  Suicidal ideation:  The patient feels calm and is able to contract for safety.  2.  Mood and psychosis:  We will continue Clozaril, lithium and Depakote but the doses were adjusted. We took Clozaril level. The patient is on a hefty dose.  3.  Seizures:  Clozaril will facilitate seizures. He is on several antiseizure medications. I wonder once Clozaril level is known if we could adjust the Clozaril dose to prevent seizure episodes. It is unclear if he had seizures prior to admission.  4.  Disposition:  He is able to return to his old group home. The guardian is in agreement with the plan.   ____________________________ Ellin GoodieJolanta B. Jennet MaduroPucilowska, MD jbp:jm D: 02/15/2013 17:46:08 ET T: 02/15/2013 18:13:46 ET JOB#: [id number]  cc: Cassidee Deats B. Jennet MaduroPucilowska, MD, <Dictator> Shari ProwsJOLANTA B Ezekeil Bethel MD ELECTRONICALLY SIGNED 03/05/2013 4:23

## 2014-06-27 NOTE — H&P (Signed)
PATIENT NAME:  Jack [name], Jack [name] MR#:  [id number] DATE OF BIRTH:  05-09-1985  SEX:  Male  RACE:  Half white and half black  AGE:  29 years  DATE OF ADMISSION:  08/11/2012  PLACE OF DICTATION:  [facility], [place]  IDENTIFYING INFORMATION:  The patient is a 29 year old mixed male who has long history of mental illness and mild MR, and has been residing at family care home for the past several months. The patient reports that he likes this family care home. The patient comes for inpatient hospital psychiatry at [facility] with the chief complaint "I didn't mean to hurt myself, I just don't know what I did. They said I jumped out of the car, and I didn't mean to hurt myself."  HISTORY OF PRESENT ILLNESS:  According to information obtained, the patient has been living at a family care home that he likes. The manager took them out for a ride, and he jumped out of the car. The patient reports that it was a mistake and he did not mean to hurt himself, and he does not know why he did it, but he did not mean to hurt himself.   PAST PSYCHIATRIC HISTORY:  Had several inpatient hospital psychiatry in the past. He never tried to kill himself, and does not want to kill himself. Being followed by a psychiatrist at the family care home.   FAMILY HISTORY OF MENTAL ILLNESS:  No known mental illness, except patient reports his stepfather and his natural father both were drug addicts.   PERSONAL HISTORY:  [place]. Went to high school, graduated from [employer], and was in IT consultantspecial education and special classes. No college. Work history:  Never worked so far.  Marital history:  Was never married, never dated, no children. Military history:  Not applicable. Alcohol and drugs: Denied drinking alcohol. Denies street drugs or preescription drug abuse.Denies using IV drugs. Denies smoking nicotine cigarettes.  MEDICAL HISTORY:  Has mild  hypertension. No known diabetes mellitus. No major surgeries. No major illness.  No history of motor vehicle accident or being unconscious.  ALLERGIES:  No known drug allergies.  Being followed by a physician who comes to the family care home. Does not know his name.  PHYSICAL EXAMINATION: VITAL SIGNS:  Temperature 96.4, pulse is 90 per minute, regular, respiratory rate 20 per minute, regular, blood pressure is 124/70 mmHg. HEENT:  Head is normocephalic, atraumatic. Eyes:  PERRLA.  Fundi are bilaterally benign. EOMs are intact. Tympanic membranes with no exudate.  NECK:  Supple, without any  thyromegaly.  CHEST:  Normal expansion. Normal breath sounds heard. HEART:  Normal, without any murmurs or gallops. ABDOMEN:  Soft. No organomegaly. Bowel sounds heard. NEUROLOGIC: Gait is normal. Romberg is negative. Cranial nerves II to XII intact. DTRs 2+. Plantars normal response.  MENTAL STATUS EXAMINATION:  The patient is dressed in street clothes. Alert. He knew that he was in the [facility]. He knew [place]. He did not know the [address] [place]. He did not know the capital of the [place]. He did not know the current president of the [place], and said "can't remember." Memory and recall are poor. He could count money. He knew 4 quarters, 10 dimes, 20 nickels, 100 pennies, a dollar. He could spell the word WORLD forward and backward without any problems. Regarding judgment, for fire, he said he would leave. For an  envelope, he said he would mail it. He denies feeling depressed. Denies feeling hopeless or helpless. Denies hearing voices. Denies paranoia or suspicious ideas. Denies any appetite or sleep disturbance. Insight and judgment guarded.  IMPRESSIONS:  AXIS I:   1.  Mood disorder, not otherwise specified.   2.  Mild mental retardation.   AXIS II:  Deferred.  AXIS III:  Mild hypertension by history.  AXIS IV:   1.  Severe. 2.  Mild  mental retardation and needs to be in a structured environment for the rest of his life.   Decompensates for no reason.   AXIS V:  GAF 20.  PLAN:  The patient will be admitted to [facility] for close observation evaluation and help He will be started back on all of his medications that he has been taking at home. During his stay in the hospital, he will be given milieu, group and supportive counseling, where behavioral issues will be addressed. At the time of discharge, patient will have his behavior under control, and appropriate followup appointment will be made in the community.    ____________________________ Jannet Mantis. Guss Bunde, MD skc:mr D: 08/11/2012 17:41:00 ET T: 08/11/2012 18:11:49 ET JOB#: [id number]  cc: Monika Salk K. Guss Bunde, MD, <Dictator> Beau Fanny MD ELECTRONICALLY SIGNED 08/12/2012 14:41

## 2014-06-27 NOTE — Consult Note (Signed)
PATIENT NAME:  Jack [name], Jack [name] MR#:  [id number] DATE OF BIRTH:  [birth year]  DATE OF CONSULTATION:  10/17/2012  REFERRING PHYSICIAN:   CONSULTING PHYSICIAN:  Izola PriceFrances C. Jaclynn MajorGreason, MD  Legal guardian name and number is his mother, Myriam JacobsonChristi McQueen, [phone]. Group home manager's name number is [facility], [place], [phone]. He is under involuntary commitment.   CHIEF COMPLAINT: "Not my fault."   PSYCHIATRIC SUMMARY: Jack [name] is a 29 year old single male, whom the police brought in for aggressive behavior and language. Also reports of "attempting to jump out of a moving vehicle."   Jack [name] says that he got "frustrated and nervous." He goes on to say that he usually  "prays a lot when he is feeling like that", but he feels like he was blamed for another person's behavior in the group home and had been hearing voices and acting on them. He said that all he wanted was a robe and a microphone so he could preach.   He also was taken to [employer] to get blood work drawn for his Clozaril and evidently tore up [employer] where he was yelling and screaming at the customer saying he was Jesus and that he could not live and leave the [employer]. He started speaking in tongues and when he got back into the [place] he tried to grab the steering wheel. He was saying that he was going to kill himself and the other people in the [place]. He jumped out of the [place], ran into another office and tore that office up and grabbed a pen and tried to stab himself. He locked himself in the [place] and they got the [place] to unlock and  he was driven to the [employer]'s office. He reported also that he was trying to burst the windows out of the [place] and was saying that "I am Jesus and God told me to hurt myself."   When I asked him about being suicidal he reports that he has been thinking about suicide and that he has been hearing voices telling him that he needs to kill himself. He has a positive history of suicide  attempts. Homicidal ideation he denies. He does have a history of violence and threats towards others.   ALCOHOL AND DRUG USE: None. His UDS is negative.  MENTAL STATUS EXAMINATION: His appearance is appropriate. He appears to be cooperative, but is very focused and hyper-religious on interview. He reports that he sleeps well. He tends to become loud and pressured in the interview at times. He reports that he has been told by God what he needs to do. He is Hyper-religious and grandiose. His memory appears to be impaired. He is reporting auditory hallucinations. He reports that his energy level is good. He is oriented to place and person. His speech is pressured and at times loud. He has minimal insight and very poor judgment.   SOCIAL HISTORY: Jack [name] lives at a group home. He reports that it is stable and that he likes it there.   MEDICAL PROBLEMS: Seizure disorder.  MEDICINES: Lithium 300 mg q.a.m. and 600 mg at bedtime, Lamictal 25 mg b.i.d., Colace sodium 100 mg p.o. b.i.d. with a glass of water, clozapine 100 mg in the evening and 400 mg at bedtime, Depakote 500 mg oral delayed-release tablet 1 tab 3 times a day.   It should be noted that his lithium level is less than 0.20 and hiss BPA level is 82. WBCs are 5.9 and his  RBCs are 4.37. His hemoglobin and hematocrit are 13.2 and 38.7. His platelets are 168. His neutrophil percent is 57.3 and 3.5.   ALLERGIES: No known drug allergies.   DIAGNOSES: AXIS I: Principal and primary is bipolar disorder by history, schizophrenia by history and intermittent explosive behavior.  AXIS III: Seizure disorder.   PLAN: Jack [name] needs to be hospitalized; however, as he is DD, MR, and [facility] is coming to see him today; however, we will wait for their imput.   We have continued Jack [name] clozaril.  ______________________________________ Izola Price. Jaclynn Major, MD fcg:aw D: 10/17/2012 12:12:21 ET T: 10/17/2012 12:46:20  ET JOB#: [id number]  cc: Izola Price. Jaclynn Major, MD, <Dictator> Maryan Puls MD ELECTRONICALLY SIGNED 10/17/2012 16:40

## 2014-06-27 NOTE — H&P (Signed)
PATIENT NAME:  DELAND, SLOCUMB MR#:  [id number] DATE OF BIRTH:  12-30-1985  DATE OF ADMISSION:  02/21/2013  REFERRING PHYSICIAN:  Emergency Room M.D.  IDENTIFYING DATA:  Mr. Lampert is a 29 year old male with history of schizoaffective disorder and borderline cognitive functioning.   CHIEF COMPLAINT:  "I don't know."   HISTORY OF PRESENT ILLNESS:  Mr. Vandevoort was hospitalized briefly at [facility] in December.  After a five day hospitalization he was discharged back to his group home on December 15th.  Three days later he returns to the hospital.  Reportedly during his day program he behaved strangely, was hitting his head with his fist, trying to stab himself with a pen, was trying to hit other peers.  He was uncooperative at the group home.  During the most recent hospitalizations his medications were adjusted and his lithium and Depakote were titrated to the therapeutic level.  The patient on admission was found to have elevated ammonia level.  Apparently he does not tolerate Depakote  well.  One of the symptoms leading to admission was religious preoccupation which really is the patient's baseline.  He always wants to preach, goes to local churches.  Initially the group home was encouraging this type of behavior and now it has gotten out of control, not only he wants to preach, but is upset when he is not allowed to do so and threatens people with God's wrath or Satan's revenge.  The patient has cognitive problems and very poor coping skills.  During his recent hospitalizations there were absolutely no behavior problems.  Apparently upon his return to the group home his problems started again.   PAST PSYCHIATRIC HISTORY:  The patient was hospitalized at [facility] before.  He was admitted here in June 2014 for exacerbation of psychosis and recently in December.  He is an incompetent adult and his mother is the guardian.  There is no history of suicide attempt.   FAMILY  PSYCHIATRIC HISTORY:  None reported.   PAST MEDICAL HISTORY:  Seizure disorder.   MEDICATIONS ON ADMISSION:   1.  Lamictal 100 mg at bedtime. 2.  Depakote 500 mg 3 times daily.  3.  Keppra 700 mg every 12 hours.  4.  Lithium 600 mg twice daily.  5.  Colace 100 mg twice daily.  6.  Clozapine 100 mg in the morning, 400 mg at bedtime.  7.  Temazepam 15 mg at bedtime.   SOCIAL HISTORY:  He is disabled from mental illness.  He lives in a group home.  His mother is the guardian.   REVIEW OF SYSTEMS:  CONSTITUTIONAL:  No fevers or chills.  No weight changes.  EYES:  No double or blurred vision.  EARS, NOSE, THROAT:  No hearing loss.  RESPIRATORY:  No shortness of breath or cough.  CARDIOVASCULAR:  No chest pain or orthopnea.  GASTROINTESTINAL:  No abdominal pain, nausea, vomiting, or diarrhea.  GENITOURINARY:  No incontinence or frequency.  ENDOCRINE:  No heat or cold intolerance.  LYMPHATIC:  No anemia or easy bruising.  INTEGUMENTARY:  No acne or rash.  MUSCULOSKELETAL:  No muscle or joint pain.  NEUROLOGIC:  No tingling or weakness.  PSYCHIATRIC:  See history of present illness for details.   PHYSICAL EXAMINATION: VITAL SIGNS:  Blood pressure 124/78, pulse 93, respirations 18, temperature 98.1.  GENERAL:  This is a well-developed male in no acute distress.  HEENT:  The pupils are equal, round, and reactive to light.  Sclerae  anicteric.  NECK:  Supple.  No thyromegaly.  LUNGS:  Clear to auscultation.  No dullness to percussion.  HEART:  Regular rhythm and rate.  No murmurs, rubs, or gallops.  ABDOMEN:  Soft, nontender, nondistended.  Positive bowel sounds.  MUSCULOSKELETAL:  Normal muscle strength in all extremities.  SKIN:  No rashes or bruises.  LYMPHATIC:  No cervical adenopathy.  Cranial nerves II through XII are intact.   LABORATORY DATA:  Chemistries are within normal limits.  Blood glucose of 105.  Blood alcohol level 0.  Plasma ammonia 69.  LFTs within normal limits.  TSH  2.233.  Lithium 0.58, Depakote 102.  Urine tox screen negative for substances.  CBC within normal limits.  Urinalysis is not suggestive of urinary tract infection.  Serum acetaminophen and salicylates are low.  During his most recent hospitalizations, we checked Clozaril level and Norclozaril combined level, it is almost 1000 which is a high level for his given dose.    MENTAL STATUS EXAMINATION ON ADMISSION:  The patient is alert, but slightly somnolent, not easy to awake, possibly from ammonia, but he also did not sleep much the night before.  He denies any problems and tells me that he is okay.  Later on when he wakes up for lunch he is pleasant, polite and cooperative.  He is well-groomed.  He is wearing private clothes.  He maintains good eye contact.  His speech is slightly slurred.  Mood is good with flat affect.  Thought process is slow.  There is poverty of thought.  He denies thoughts of hurting himself or others.  He denies [facility] to [facility] and does not remember that he was trying to hurt anybody else.  He is not preoccupied with religion today.  There are no auditory hallucinations.  His cognition is difficult to assess.  His insight and judgment are limited.   SUICIDE RISK ASSESSMENT ON ADMISSION:  This is a patient with a history of psychosis, mood instability and poor coping skills.  He may be at increased risk of suicide.   INITIAL DIAGNOSES:  AXIS I:  Schizoaffective disorder, bipolar type.  AXIS II:  Borderline intellectual functioning.  AXIS III:  Seizure disorder.  AXIS IV:  Mental and physical illness, conflict at the group home, primary support, coping skills, cognitive difficulties.  AXIS V:  Global assessment of functioning on admission 25.   PLAN:  The patient was admitted to [facility] for safety, stabilization and medication management.  He was initially placed on suicide precautions and was closely  monitored for any unsafe behaviors.  He underwent full psychiatric and risk assessment.  He received pharmacotherapy, individual and group psychotherapy, substance abuse counseling and support from therapeutic milieu.  1.  Agitated, unsafe behavior.  This has resolved.   2.  Mood and psychosis.  We will continue all his medications, but we will hold Depakote.  3.  High ammonia.  We will start lactulose.  4.  Disposition.  It is unclear at this point whether the patient can return to his group home.  We will discuss it with the guardian.    ____________________________ Ellin GoodieJolanta B. Jennet MaduroPucilowska, MD jbp:ea D: 02/22/2013 19:37:52 ET T: 02/22/2013 21:08:01 ET JOB#: [id number]  cc: Barnie Sopko B. Jennet MaduroPucilowska, MD, <Dictator> Shari ProwsJOLANTA B Eusebio Blazejewski MD ELECTRONICALLY SIGNED 03/05/2013 4:45

## 2014-06-27 NOTE — Consult Note (Signed)
Brief Consult Note: Diagnosis: Schizoaffective Disorder, Bipolar Type, DD. H/o MR.   Patient was seen by consultant.   Recommend further assessment or treatment.   Comments: Pt seen this am. He stated that he has started taking his medications and has been feeling better. He reported that he was missing the doses of his medication and now restarted them. he is not having any adverse effects. He lives in a Group home and [facility]   Plan; Will continue his meds as prescribed.  Will check his LI and Depakote level in couple of days.  Continue to monitor.  Electronic Signatures: Rhunette CroftFaheem, Bretton Tandy S (MD)  (Signed 14-Aug-14 12:30)  Authored: Brief Consult Note   Last Updated: 14-Aug-14 12:30 by Rhunette CroftFaheem, Auren Valdes S (MD)

## 2014-06-27 NOTE — Consult Note (Signed)
Brief Consult Note: Diagnosis: Schizoaffective disorder.   Patient was seen by consultant.   Recommend further assessment or treatment.   Orders entered.   Comments: [name] will be admitted to psychiatry.  Electronic Signatures: Kristine LineaPucilowska, Liliah Dorian (MD)  (Signed 12-Dec-14 13:25)  Authored: Brief Consult Note   Last Updated: 12-Dec-14 13:25 by Kristine LineaPucilowska, Shakeira Rhee (MD)

## 2014-06-27 NOTE — Consult Note (Signed)
Brief Consult Note: Diagnosis: Schizoaffective disorder, mild MR.   Patient was seen by consultant.   Consult note dictated.   Recommend further assessment or treatment.   Orders entered.   Discussed with Attending MD.  Electronic Signatures: Kristine LineaPucilowska, Arnel Wymer (MD)  (Signed 18-Aug-14 13:02)  Authored: Brief Consult Note   Last Updated: 18-Aug-14 13:02 by Kristine LineaPucilowska, Layton Naves (MD)
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
## 2014-06-28 NOTE — Consult Note (Signed)
Psychiatry: Follow-up note for this 29 year old man with bipolar disorder manic.  He was brought to the emergency room late last week with an exacerbation of his manic symptoms.  He was hyperverbal agitated hyper religious and could not control his loud behavior.  He was not actually aggressive to anyone and was not harming himself.  His lithium level was slightly on the low side.  Patient did not really need to be readmitted to the hospital.  He had just been discharged a couple days ago.  Instead we continued his clozapine and lithium as prescribed. evaluation today the patient reports that he is feeling good.  He still describes his mood as being spiritual but denies that he is feeling angry depressed or agitated.  He denies any thoughts of harming himself or others.  He feels like his energy level is under better control.  He has been able to sleep better.  Regular medication management seems to have helped as well as decreasing his stimulation level. review of systems he denies suicidal or homicidal ideation denies hallucinations.  Denies any thoughts about doing anything aggressive.  Does not report any pain does not report any other physical symptoms the rest of the review of systems is grossly negative.physical exam patient is slightly disheveled.  He is awake and cooperative with the exam.  Eye contact good.  Psychomotor activity normal.  Speech normal in rate tone and volume.  Affect is more calm and appropriate.  Mood stated as being good and spiritual.  Thoughts are still hyper religious and a little bit scattered which is baseline for him.  Denies that he is having hallucinations.  Denies suicidal or homicidal ideation.  He gets side tracked very easily into his hyper religious statements but at least he is able to hold a basic conversation.  Fund of knowledge low chronically cognitively impaired.  Short-term memory grossly intact long-term memory fairly intact. with bipolar disorder symptoms of  which have improved.  He is on clozapine total of 500 mg per day and lithium total of 1200 mg a day.  Those medications as well as others have been continued in the hospital.  He is not on involuntary commitment.  He does not require inpatient hospitalization.  He is group home is agreeable to taking him back.  I have discussed the case with the emergency room attending.  It is our advice that he did not go to the daily group treatment that he has been recently attending if it may be overstimulating for him.  No change to medicine.Discharged from the emergency room follow-up with his usual provider and group home. Bipolar disorder type I manic secondary diagnosis Axis I rule out schizoaffective disorder Axis II borderline MR.  Electronic Signatures: Audery Amellapacs, Cody Oliger T (MD)  (Signed on 09-Feb-15 17:46)  Authored  Last Updated: 09-Feb-15 17:46 by Audery Amellapacs, Lisl Slingerland T (MD)

## 2014-06-28 NOTE — H&P (Signed)
PATIENT NAME:  Jack [name], Jack [name] MR#:  [id number] DATE OF BIRTH:  12-20-1985  DATE OF ADMISSION:  03/20/2013  DATE OF ASSESSMENT: 03/22/2013   IDENTIFYING INFORMATION AND CHIEF COMPLAINT: A 29 year old man with a history of schizoaffective disorder, brought in here from his group home because of increased agitation, hyperreligiosity, unmanageable behavior. The patient's chief complaint: "I'm a bishop."   HISTORY OF PRESENT ILLNESS: Information obtained from the patient and the chart. The patient evidently started becoming increasingly hyperreligious and agitated with raised voice and hyperactive behavior with constant religious delusions, starting at least several days ago. Possibly could have been cued by a visit to church. Apparently, he has been on his medications. As far as we know, he has been taking them. There is no evidence of any substance abuse problems. No other known new stressor. The patient himself is so consumed by his hyperreligious delusions, he is not able to give much in the way of other useful history right now.   PAST PSYCHIATRIC HISTORY: Long history of schizoaffective disorder. Also has a diagnosis of mental retardation. Also has a diagnosis of seizure disorder. He has had several visits to our hospital. He was an inpatient just last month. At that time, he was fairly depressed. He does have several episodes in which he has been to the Emergency Room agitated before. He has a history of trying to hurt himself. Also has a history of getting aggressive, mainly with property in the past. He has responded to and been stable on clozapine and lithium as his most recent combination of medicines.   PAST MEDICAL HISTORY: The patient has a seizure disorder, not known when his last actual seizure was. Otherwise, no significant ongoing medical problems.   SOCIAL HISTORY: His mother is his legal guardian. He resides in a group home. They usually do a pretty good job of trying to contain him, but  when he becomes this manic, it is very hard to do.   SUBSTANCE ABUSE HISTORY: Does not drink. Does not use drugs. Does not have a history of substance abuse.   FAMILY HISTORY: Unknown.   REVIEW OF SYSTEMS: The patient is not able to give much in the way of useful history. He only indicates his religious delusions, including his belief that he is a Conservator, museum/gallery. Also, he has behaviors that indicate that he is having hallucinations and seems to be acting as though he can see bizarre things in the room.   CURRENT MEDICATIONS: Clozapine 300 mg in the morning and 200 mg at night, Depakote 500 mg twice a day, Ativan p.r.n., lithium 600 mg b.i.d., Lamictal 200 mg at night, Keppra 750 mg every 12 hours.   ALLERGIES: No known drug allergies.   MENTAL STATUS EXAMINATION: Slightly disheveled man who looks his stated age. Not really cooperative with the interview. Engages only on his own terms. Eye contact intermittent. He is hyperactive, pacing around, yelling frequently, waving his arms. Constant stream of hyperreligious delusional content. Although he is loud and has a somewhat aggressive affect, he denies that he has any thoughts about hurting anyone, denies any suicidal ideation. He does appear to be having visual and auditory hallucinations. Judgment and insight impaired. Intelligence chronically low. Alert and oriented to the situation.   PHYSICAL EXAMINATION:  GENERAL: I really could not get a chance to get a very good physical exam on him. No visible skin lesions.  NEUROMUSCULAR: He paces around with a normal gait. Appears to have normal range of motion at all extremities.  Cranial nerves appear to all be functional and symmetric. Everything about his movement would suggest symmetric strength throughout. He is breathing easily.  CURRENT VITAL SIGNS: Include blood pressure 137/83, respirations 18, pulse 98, temperature 98.   LABORATORY RESULTS: Valproic acid level on admission was on 100. Lithium level low  at 0.44. The urinalysis had 1+ bacteria, otherwise kind of borderline. Drug screen negative. TSH normal at 2.15. CBC: Low hematocrit and hemoglobin. Alcohol negative. Chemistry panel normal except for a slightly high glucose of 118.   ASSESSMENT: A 29 year old man with schizoaffective disorder presents with a hyperreligious mania. He has been taking his medicine here, and he is starting to calm down a little bit. He was seen by the psychiatry consultant yesterday and deemed to be ready to go home, but he seems to have escalated since then. I do not think he is safe to be discharged in this condition. He will be admitted to the psychiatry ward on his current medicine along with p.r.n. doses of Seroquel, and we will try to get him managed over the next couple of days.   DIAGNOSIS, PRINCIPAL AND PRIMARY:  AXIS I: Schizoaffective disorder, bipolar type, manic.  SECONDARY DIAGNOSES:   AXIS II: Mental retardation.  AXIS III: History of seizure disorder.  AXIS IV: Moderate chronic stress from burden of illness.  AXIS V: Functioning at time of evaluation is 30.    ____________________________ Jack [name] Sample, MD jtc:jcm D: 03/22/2013 17:31:44 ET T: 03/22/2013 17:55:56 ET JOB#: [id number]  cc: Jack AmelJohn T. Thurlow Gallaga, MD, <Dictator> Jack AmelJOHN T Hendrixx Severin MD ELECTRONICALLY SIGNED 03/22/2013 20:14

## 2014-06-28 NOTE — Consult Note (Signed)
PATIENT NAME:  Jack [name], [name] MR#:  [id number] DATE OF BIRTH:  [birth year]  DATE OF CONSULTATION:  08/20/2013  REFERRING PHYSICIAN:   CONSULTING PHYSICIAN:  Audery AmelJohn T. Clapacs, MD  IDENTIFYING INFORMATION AND CHIEF COMPLAINT: A 29 year old man with a long history of schizoaffective disorder and probably developmental disorder who was petitioned here because of agitated behavior at his group home. The patient's chief complaint: "I am going to heaven." Consultation for appropriate evaluation.   HISTORY OF PRESENT ILLNESS: Information obtained from the patient, from the paperwork, from his old chart and from direct information from his group home and family. It is reported that for the last several days he has been increasingly agitated, taking his clothes off, being loud, being agitated, disrupting other people at his group home. This follows on the heels of about a week or so of actually relatively quiet and appropriate behavior after he was discharged [facility]. This is a return to his usual manic presentation that has landed him in the hospital many times in the past. The patient has evidently been compliant with medicines and has not been abusing any drugs. No known specific stimulus. He was just recently at [facility] and we have documentation of that hospitalization. During that time, he was given ECT to treat his bipolar disorder because of his failure to respond to multiple mood stabilizing medications. According to the accompanying paperwork the ECT was helpful enough that it allowed him to be discharged from the hospital, however, maintenance ECT was not pursued.   PAST PSYCHIATRIC HISTORY: Multiple hospitalizations. He gets loud and aggressive at times, but has not as far as we know actually intentionally hurt anyone. No suicidal behavior.   SUBSTANCE ABUSE HISTORY: Does not drink or abuse drugs.   SOCIAL HISTORY: The patient is chronically mentally ill. His mother is his guardian and is  closely involved in his treatment, but he lives in a group home because of his behavior and the difficulty controlling him.   PAST MEDICAL HISTORY: Young man, mild to moderate acne. No other significant ongoing medical problems, other than his mental illness.   REVIEW OF SYSTEMS: The patient is quite incoherent. 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 Does not have any physical complaints.   MENTAL STATUS EXAMINATION: Adequately groomed young man. He is only partially cooperative with the interview. Mostly he talks about what he is interested in. Eye contact intermittent. Psychomotor activity is agitated, fidgety and he is often up pacing around. Speech is telegraphic in style but at times quite loud. Affect is labile. He has outbursts during which he seems to be angry and aggressive and also has outbursts during which he begins crying or becomes excited. Mood is not stated. Thoughts are disorganized, hyperreligious, constant flight of ideas. Cannot answer questions about hallucinations. Denies suicidal or homicidal ideation. Cannot cooperate with cognitive testing. Unable to tell how oriented he really is to his situation.   CURRENT MEDICATIONS: The patient is continued on the same medicines he was on when he [facility], which are clozapine 300 mg twice a day, Depakote 1000 mg at night, docusate 200 mg twice a day, lamotrigine 100 mg at night, lithium carbonate 600 mg twice a day, and MiraLax 17 grams a day.   ALLERGIES: No known drug allergies.   VITAL SIGNS: His blood pressure is 117/75, respirations 20, pulse 87, temperature 97.6.   LABORATORY RESULTS: Labs done yesterday evening when he  came in: Chemistry panel is normal, except for a slightly low potassium. Alcohol level negative. Lithium level 0.74, which is just about exactly the level that they got when he was at [facility]. CBC is all unremarkable. Salicylates and acetaminophen negative.    ASSESSMENT: A 29 year old man with schizoaffective disorder, manic. He had a good response recently to ECT but decompensated after only a week, back to his manic behavior that is incompatible with management at a group home.   TREATMENT PLAN: Admit to psychiatry. We consulted with his mother. She had at first suggested taking him to [facility]. If she had felt safe and appropriate about it and had intended to take him to [facility] we could have negotiated that, but she is okay with our proposal that we admit him to the hospital here. At this point, I am going to tentatively think about doing ECT with him again as that seems to have been a particularly helpful treatment. Continue current medicines for now. We do not have a bed available this evening, otherwise we would admit him right now.   DIAGNOSIS, PRINCIPAL AND PRIMARY: AXIS I: Schizoaffective disorder, depressed.   SECONDARY DIAGNOSES: AXIS I: None. AXIS II: None.  AXIS III: None.  AXIS IV: Moderate to severe.  AXIS V: Functioning at time of evaluation 20.  ____________________________ Audery Amel, MD jtc:sb D: 08/20/2013 16:48:03 ET T: 08/20/2013 17:04:25 ET JOB#: [id number]  cc: Audery Amel, MD, <Dictator> Audery Amel MD ELECTRONICALLY SIGNED 08/21/2013 13:24

## 2014-06-28 NOTE — Consult Note (Signed)
Brief Consult Note: Diagnosis: schizoaffectrive disorder.   Patient was seen by consultant.   Recommend further assessment or treatment.   Orders entered.   Comments: Psychiatry: PAtient seen. Well known to us. Schizoaffective disorder with manic presentation. PAtient's mother strongly prefers, I am told, he go [facility]. I will hhold off admission until we clarify her intentions. Otherwise we can consider restarting ECT, which was helpful while he was [facility] recently.  Electronic Signatures: Audery Amellapacs, John T (MD)  (Signed 16-Jun-15 12:22)  Authored: Brief Consult Note   Last Updated: 16-Jun-15 12:22 by Audery Amellapacs, John T (MD)

## 2014-06-28 NOTE — Consult Note (Signed)
PATIENT NAME:  Jack [name], Jack [name] MR#:  [id number] DATE OF BIRTH:  10/14/1985  DATE OF CONSULTATION:  03/21/2013  REFERRING PHYSICIAN:  Lowella Fairy, MD CONSULTING PHYSICIAN:  Ardeen Fillers. Garnetta Buddy, MD  REASON FOR CONSULTATION:  "I'm a bishop now".  HISTORY OF PRESENT ILLNESS:  The patient is a 29 year old patient with long history of schizophrenia, who was readmitted again to the ER after he presented under IVC by the law enforcement. The patient has a history of multiple presentations to the ER and was recently discharged from the [facility] in December. He has a long history of being hyper-religious and reports that he is currently getting messages from the God. He reported "I'm a Bishop now". He stated that they do not want to listen to him because he is getting messages from the God. The patient stated that he has been taking his medications for 5-1/2 weeks and is not sleeping well. He reported that the God is giving him his medications, and wine is his current medications. He stated that whenever he drinks wine he can feel God's power going to him. The patient also mentioned about drinking alcohol which was given to him by Dewayne Hatch, the resident, and it was purchased from a nearby store.   During my interview, the patient appears somewhat hyper-religious and he stated that he feels the power of the God but he is not hearing any voices. He reported that whenever somebody tries to touch him they get pushed over by the God. He reported that they were trying to hit him but they cannot touch him. He stated that he is a spirit and he is going to get healed from the God. He remains hyper-religious toward the interview. He reported that he does not want to take any medications, but remains compliant with his medications.   PAST PSYCHIATRIC HISTORY:  The patient has been hospitalized multiple times in different area hospitals including [facility]. He was also admitted a couple of times at [facility] for exacerbation of his psychosis and manic symptoms. His mother is his current guardian as he was declared incompetent in the court. The patient does not have any history of suicide attempts.   FAMILY PSYCHIATRIC HISTORY:  None reported.   PAST MEDICAL HISTORY:  Seizure disorders.   CURRENT MEDICATIONS:  1.  Lamotrigine 200 mg daily.  2.  Keppra 750 mg every 12 hours. 3.  Lithium carbonate 300 mg 2 pills b.i.d.  5.  Clozaril 200 mg in the morning and 300 mg at bedtime.  6.  Colace 100 mg twice daily.  7.  Temazepam 15 mg at bedtime.   SOCIAL HISTORY:  The patient is currently disabled and lives in a group home. His mother is his guardian.   ALLERGIES:  No known drug allergies.   VITAL SIGNS:  Temperature 97.3 pulse 106, respirations 18, blood pressure 119/57.  LABORATORY, DIAGNOSTIC, AND RADIOLOGICAL DATA:  Glucose 118, BUN 7, creatinine 1.01, sodium 139, potassium 3.9, chloride 107, bicarbonate 27, anion gap 5, osmolality 277, calcium 8.8. Blood alcohol level less than 3. Protein 7.2, albumin 3.8, bilirubin 0.2, alkaline phosphatase 71, AST 20, ALT 16. TSH 2.15, lithium level less than 0.4, Depakote level 100. UDS was negative. WBC 5.1, RBC 4.05, hemoglobin 12.3, hematocrit 36.3, MCV is 90.   REVIEW OF SYSTEMS:  CONSTITUTIONAL:  Denies any fever or chills. No weight changes.  EYES:  No double or blurred vision. ENT:  No hearing loss.  RESPIRATORY:  No shortness  of breath or cough.  CARDIOVASCULAR:  No chest pain or orthopnea.  GASTROINTESTINAL:  No abdominal pain, nausea, vomiting, or diarrhea.  GENITOURINARY:  No incontinence or frequency.  ENDOCRINE:  No heat or cold intolerance.  LYMPHATIC:  No anemia or easy bruising.  INTEGUMENTARY:  No acne or rash.  MUSCULOSKELETAL:  No muscle or joint pain.   MENTAL STATUS EXAMINATION:  The patient is a moderately built male who appeared his stated age. He was calm and cooperative. He continues to focus on his delusional thinking  about hearing thoughts from the God and remains religiously preoccupied. His mood was fine with congruent affect. He denied having any suicidal ideations or plans. He currently denied having any auditory or visual hallucinations.   DIAGNOSTIC IMPRESSION:  AXIS I: Schizoaffective disorder, bipolar type.  AXIS II: Borderline intellectual functioning.  AXIS III: Seizure disorder.   TREATMENT PLAN:  1.  The patient is currently under the involuntary commitment as he came to the Emergency Room from the group home for safety and stabilization.  2.  He will continue on his current medications at this time.  3.  [facility]  for placement as the patient is not exhibiting any behavioral issues at this time. If he remains calm he can be discharged back to the group home without any changes in his current medications.   I have discussed his case with the behavioral health staff and they also agreed with the plan. Thank you for allowing me to participate in the care of this patient.   ____________________________ Ardeen FillersUzma S. Garnetta BuddyFaheem, MD usf:jm D: 03/21/2013 10:40:58 ET T: 03/21/2013 11:10:37 ET JOB#: [id number]  cc: Ardeen FillersUzma S. Garnetta BuddyFaheem, MD, <Dictator> Rhunette CroftUZMA S Krisanne Lich MD ELECTRONICALLY SIGNED 03/21/2013 14:56

## 2014-06-28 NOTE — H&P (Signed)
PATIENT NAME:  Jack [name], Jack [name] MR#:  [id number] DATE OF BIRTH:  01/23/1986  DATE OF ADMISSION:  08/19/2013  DATE OF ASSESSMENT:  08/21/2013  IDENTIFYING INFORMATION AND CHIEF COMPLAINT:  This is a 29 year old male with a history of bipolar disorder who was brought to the Emergency Room under involuntary commitment after behaving bizarrely and aggressively at his outpatient treatment.   CHIEF COMPLAINT: "Ask the Reverends."   HISTORY OF PRESENT ILLNESS: Information from the patient and the chart. According to the chart he had recently been at [facility] for an extended stay. Ultimately, that did ECT for him to treat his mania. Apparently he had six treatments and had a good response, but was then discharged without maintenance ECT. According to the group home owner, he did okay for a week or so, but then started taking his clothes off and becoming agitated and excessively hyperreligious. On the day of admission, he was acting bizarre and out of control at [place]. The patient is a poor historian. He tells me that he is God. That I should ask the Reverends.  That he has been made a Reverend and rambles on in that fashion unable directly answer any questions. Apparently, he has still been cooperative with all of his medication.   PAST PSYCHIATRIC HISTORY: Bipolar or schizoaffective disorder, with a history of developmental disability as well. He has been to our hospital several times in the past. I have seen him in both manic and depressed phases. Most recently, he had been at our hospital for a mania that only partially responded despite multiple medications. He has been on clozapine for a while which has helped a little bit, but he still is prone to bizarre and psychotic symptoms from that. He denies any history of suicide attempts. It looks like he has been aggressive with people. It is not clear if he has ever really hurt anyone.   SOCIAL HISTORY: Mother is his legal guardian. He lives in a group home.  Social life constricted to family and group home.   PAST MEDICAL HISTORY: Questionable seizure disorder, although it looks like he has been maintained on Keppra. Constipation. No other known ongoing medical problems.   FAMILY HISTORY: No known family history.   SUBSTANCE ABUSE HISTORY: Denies use of alcohol or drugs. No past history of that as a significant problem.   REVIEW OF SYSTEMS: Poor historian. Denies suicidal or homicidal ideation. Will not answer questions about hallucinations. Denies any acute physical symptoms. The whole physical review of systems is negative.   MENTAL STATUS EXAMINATION: Slightly disheveled gentleman who looks his stated age, cooperative with the interview, but only passively so. Eye contact intermittent. Psychomotor activity, labile, at times very agitated at other times withdrawn and almost asleep. Mood stated as good. Thoughts are rambling and disorganized. Very hyperreligious. Denies suicidal or homicidal ideation. Judgment and insight poor. Cannot cooperate with cognitive testing. Does not know where he is. Cannot answer questions about his current situation.   PHYSICAL EXAMINATION: SKIN: Has old acne scars otherwise, no acute skin lesions.  HEENT: Pupils are both almost pinpoint right now, but still reactive. Face symmetric. Oral mucosa dry.  NECK AND BACK: Nontender.  MUSCULOSKELETAL: Full range of motion at all extremities. Normal gait. Strength and reflexes normal and symmetric throughout. Cranial nerves symmetric and normal.  LUNGS: Clear without wheezes.  HEART: Regular rate and rhythm.  ABDOMEN: Soft, nontender, normal bowel sounds.  VITAL SIGNS: Most recent in the Emergency Room include a blood pressure of 102/62,  respirations 20, pulse 82, temperature 97.6.   LABORATORY RESULTS: White count and neutrophil count both normal. Lithium level 0.74. Alcohol negative. Chemistry panel: Low potassium 3.3, otherwise unremarkable. CBC unremarkable.    ASSESSMENT: A 29 year old man with schizoaffective disorder and developmental disability, who is once again manic and agitated. Behaviors had been out of control at the group home. Today, he is a little bit more subdued okay. He is still labile. Needs hospitalization for stabilization.   TREATMENT PLAN: Admit to psychiatry. This has been discussed with his mother and she is agreeable to it. If he continues to be highly symptomatic, I am going to propose we immediately restart ECT if that was clearly helpful in the past. We can then try and work on a plan for maintenance ECT. Otherwise, for now am going to continue him on his current medications of lithium, Depakote, Lamictal and Keppra. Close and escape and elopement precautions in place as well as fall precautions and seizure precautions. The usual labs will be followed for clozapine.   CURRENT MEDICATIONS: Clozapine 300 mg twice a day, Depakote extended-release 1000 mg at night, docusate 200 mg twice a day, lamotrigine 100 mg at night, lithium carbonate 600 mg twice a day, Ativan p.r.n. for agitation, MiraLAX 17 grams a day, melatonin 3 mg at night.   ALLERGIES: No known drug allergies.   ____________________________ Audery AmelJohn T. Clapacs, MD jtc:dp D: 08/21/2013 14:33:33 ET T: 08/21/2013 14:52:09 ET JOB#: [id number]  cc: Audery AmelJohn T. Clapacs, MD, <Dictator> Audery AmelJOHN T CLAPACS MD ELECTRONICALLY SIGNED 08/27/2013 17:11

## 2014-06-28 NOTE — Discharge Summary (Signed)
PATIENT NAME:  Jack [name], Jack [name] MR#:  [id number] DATE OF BIRTH:  12/05/1985  DATE OF ADMISSION:  08/21/2013 DATE OF DISCHARGE:  08/28/2013  HOSPITAL COURSE: See dictated history and physical for details of admission. A 29 year old man with schizoaffective disorder, well known to our service. Was brought to the Emergency Room from his group home having become hyperverbal, hyperreligious, hyperactive, difficult to control. His usual presentation when he is manic. The history we received was that he had recently been at [facility] being treated with ECT and had been doing well for about a week after coming home but then had decompensated. Initially, I admitted him to our facility with the idea that we could do ECT treatment here. However, after resuming his medication, the patient's clinical condition improved significantly from the very first day. He became calmer and slowed down, not hyperactive nearly as much. He remained confused and difficult to understand in his speech but was not aggressive or difficult to manage. He was taking care of his basic hygiene okay, eating okay. I decided to continue him with his medication rather than resume ECT as it was not clear that there was a specific symptom to be treated immediately. He was continued on clozapine as well as lithium, Depakote and lamotrigine. For several days, he seemed to be sedated much of the time and stayed isolated, but for the last couple of days, he has been much more social and appropriate. His speech has slowed down to where I can understand him better. He is still hyperreligious but not as dramatically so as before. It is not getting in the way of other people. He is not going around touching people blessing them or preaching at them. He seems to be tolerating medicine well. For some period of time, he was having quite a bit of drooling, so I gently dialed back his clozapine, and he no longer seems to be having a big problem with that. His  Depakote and lithium levels are appropriate. At this point, I think it is best that we discharge him back to his group home. He will continue with outpatient followup with [facility]. The idea of ECT remains a possibility in the future but is not needed right now.   DISCHARGE MEDICATIONS: Lamotrigine 100 mg at night, Depakote extended release 1000 mg at night, lithium carbonate 600 mg twice a day, clozapine 250 mg twice a day, lorazepam 2 mg every 2 hours as needed for agitation, docusate 200 mg twice a day, MiraLAX 17 g once a day, melatonin 3 mg at night.   MENTAL STATUS EXAM: The patient is a casually dressed but of adequate hygiene gentleman, looks his stated age. Passively cooperative. Eye contact intermittent. Psychomotor activity a little bit slowed. Speech is decreased in total amount but at least it is understandable now. Actually, a little bit quiet. Affect blunted. Mood stated as okay. Thoughts are still disorganized, and he still tends to focus on hyperreligious subjects but was able also to have a basic conversation about concrete matters. No longer seems to be constantly responding to internal stimuli. Not reporting suicidal or homicidal ideation. Judgment and insight chronically impaired. General cognitive function chronically impaired. Short- and long-term memory impaired.   LABORATORY RESULTS: Admission labs included a drug screen that was negative, urinalysis that was unremarkable, a chemistry panel slightly low potassium 3.3. Alcohol level negative. Lithium level 0.74. CBC on admission was normal except for a very slightly low hematocrit of no significance at 39.7. His absolute neutrophil  counts and white counts have been normal throughout his hospital stay. Acetaminophen and salicylates negative. The most recent lab test done on the 23rd include a lithium level of 0.92 and a Depakote level of 85.   DISPOSITION: Discharge home. Follow up with [facility].   DIAGNOSIS, PRINCIPAL AND PRIMARY:   AXIS I: Schizoaffective disorder, bipolar type, hypomanic.   SECONDARY DIAGNOSES:  AXIS I: No further.  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  AXIS III: Constipation.  AXIS IV: Moderate to severe from chronic disability.  AXIS V: Functioning at time of discharge is 50.    ____________________________ Audery AmelJohn T. Clapacs, MD jtc:gb D: 08/28/2013 12:30:27 ET T: 08/28/2013 23:58:11 ET JOB#: [id number]  cc: Audery AmelJohn T. Clapacs, MD, <Dictator> Audery AmelJOHN T CLAPACS MD ELECTRONICALLY SIGNED 08/31/2013 1:49

## 2014-06-28 NOTE — Consult Note (Signed)
Brief Consult Note: Diagnosis: bipolar disorder manic.   Patient was seen by consultant.   Consult note dictated.   Recommend further assessment or treatment.   Orders entered.   Discussed with Attending MD.   Comments: Psychiatry: PAtient with bbipolar disorder and currently manic behavior. Just discharged 3 days ago. Not acutely threatening self or others but very loud and disruptive. Restarted meds. Will not admit inpt yet as he just left and really should be manageable outpt. Working with group home on discharge plan.  Electronic Signatures: Audery Amellapacs, Jasier Calabretta T (MD)  (Signed 06-Feb-15 17:11)  Authored: Brief Consult Note   Last Updated: 06-Feb-15 17:11 by Audery Amellapacs, Brealynn Contino T (MD)

## 2014-06-28 NOTE — Discharge Summary (Signed)
PATIENT NAME:  Jack [name], [name] MR#:  [id number] DATE OF BIRTH:  [birth year]  DATE OF ADMISSION:  03/22/2013 DATE OF DISCHARGE:  04/09/2013  HOSPITAL COURSE: See dictated history and physical for details of admission. A 29 year old man with history of schizoaffective disorder and borderline mental retardation. He was extremely manic, hyperreligious, agitated, loud, not responding to verbal intervention at the time of admission. Spent several days in the Emergency Room and ultimately calmed down enough to come to the unit. He remained manic for much of his hospital stay. This was despite treatment with clozapine and lithium, both at doses to give good blood levels. He did not have any evidence of seizure activity. He was not violent or aggressive to others, although he was distracting. Denied suicidal ideation. He was not able to participate in much therapy group. He was gradually able to participate more in individual counseling. At the time of discharge, for the last couple of days, he has been much more calm. He still is hyperreligious, but he is not agitated or aggressive and is more verbally re-directable. He is tolerating medicine well. The group home and is agreeable to having him come back. He will follow up with his outpatient treatment, which is arranged through [facility].   MENTAL STATUS EXAMINATION AT DISCHARGE: Adequately groomed gentleman, reasonably clean, looks stated age. The patient is alert and oriented to situation, does not know the exact date. He has rapid speech. Loud at times. Variable however. Thoughts are hyperreligious, often racing. Not as bad as when he came in. Still subject to have occasional loosening of associations with hyperreligiosity. Denies auditory or visual hallucinations. Denies suicidal or homicidal ideation. Judgment and insight adequate given that he is going to be in a group home and has a guardian. Short and long-term memory difficult to assess, somewhat impaired. Unable  to cooperate with attention span. Language use is generally fluent.   DISCHARGE MEDICATIONS: Lamotrigine 200 mg at night, Keppra 750 mg twice a day, lithium 600 mg twice a day, clozapine 200 mg in the morning and 300 mg at night, lorazepam 2 mg every 2 hours as needed for agitation, temazepam 15 mg at night as needed for sleep, and docusate 100 mg twice a day.   LABORATORY RESULTS: Most recent lithium level done yesterday was 1.1. His white blood cell count and absolute neutrophil counts have been normal. His admission labs included low lithium level at 0.44, valproic acid level 100, chemistry panel negative, TSH normal, and alcohol undetected. Chemistry panel just an elevated glucose of 118. CBC all normal.   DISPOSITION: Discharge back to his group home. Follow up with [facility].   DIAGNOSIS, PRINCIPAL AND PRIMARY:  AXIS I: Seasonal schizoaffective disorder, bipolar type, manic.   SECONDARY DIAGNOSES: AXIS I: No further diagnosis.  AXIS II: Borderline mental retardation.  AXIS III: Seizure disorder.  AXIS V: Severe from chronic illness.  AXIS V: Functioning at time of discharge 40.  ____________________________ Audery AmelJohn T. Eustacio Ellen, MD jtc:sb D: 04/09/2013 16:16:43 ET T: 04/09/2013 16:52:20 ET JOB#: [id number]  cc: Audery AmelJohn T. Zai Chmiel, MD, <Dictator> Audery AmelJOHN T Kamry Faraci MD ELECTRONICALLY SIGNED 04/09/2013 17:28

## 2014-07-06 NOTE — Consult Note (Signed)
PATIENT NAME:  Jack [name], Jack [name] MR#:  [id number] DATE OF BIRTH:  [birth year]  DATE OF CONSULTATION:  05/05/2014  REFERRING PHYSICIAN:   CONSULTING PHYSICIAN:  Audery Amel, MD  IDENTIFYING INFORMATION AND REASON FOR CONSULTATION: A 29 year old man well known to [place] with a history of bipolar or schizoaffective disorder along with some degree of developmental disability. He was brought here initially voluntarily, but then placed under commitment by the Emergency Room physician.   CHIEF COMPLAINT: "She was hollering at me."   HISTORY OF PRESENT ILLNESS: Information obtained from the patient and from the group home. They tell approximately the same story from different perspectives. The patient says that he had been feeling a lot pressure of the spirit recently. He had been speaking from his heart to people at the group home. This is his way of saying that he had been having intense hyperreligious feelings, probably involving some degree of hallucinations, and that he had been preaching more aggressively. Whatever the exact circumstances, he says he got into what he thought was a confrontation with one of the staff members at the group home. He got agitated about it and he tried to solve the problem by walking away. They got worried about him and had him brought into the hospital.   They report that he has been more animated since Saturday. He has been more hyperreligious, more aggressive. They cite one incident in which he cocked back his fist as though he were going to punch someone for no clear reason. It sounds like he has also been invading the space of staff and residents more than he does usually. As far as I can tell, there have not been any changes in his medicines. Certainly nothing has been decreased recently. We are not getting any information that there has been any kind of emotional upset in his life. The patient does not abuse drugs or alcohol. Unclear what might have caused this worsening. He  says that he sleeps somewhat poorly at night.   PAST PSYCHIATRIC HISTORY: Long history of schizoaffective or bipolar disorder. I have seen this patient in both depressed and manic forms. When he is manic, he becomes very hyperreligious which can become very disruptive and loud at times. We do not know of him actually being physically assaultive to anyone and there is no history of suicide attempts. He has responded to medication, but usually just at high doses and even then, often continues to have some hyperreligiosity. He has had ECT in the past for mania, which was at least partially successful.   SOCIAL HISTORY: The patient's mother is his legal guardian. As far as I know, that has not changed. He says that he has been in 2 different group homes since he was here last, which would mean since last summer. He says he has not been hospitalized since then. He indicates that he goes to the day program, but the history is a little bit confusing. I am not sure how accurate that is.   FAMILY HISTORY: None known.   PAST MEDICAL HISTORY: He has chronic constipation directly related to his medication and also has mild prediabetes related to his medication for which he takes metformin.   SUBSTANCE ABUSE HISTORY: No history of abuse of alcohol or drugs.   CURRENT MEDICATIONS: Melatonin 6 mg at night, lithium carbonate 600 mg in the morning and 900 mg at night, lamotrigine 100 mg at night, Depakote 1500 mg at night, clozapine 250 mg in the morning and  300 mg at night, metformin 500 mg at night, docusate 200 mg at night, vitamin D 5000 international units every morning, Senokot p.r.n.   ALLERGIES: No known drug allergies.   REVIEW OF SYSTEMS: He talks about feeling pressure of the spirit. These are terms that I have heard him use in the past. It typically seems to mean that he is feeling some kind of anxiety or unpleasant feeling related to his hyperreligiosity. He does not describe any other physical symptoms.    MENTAL STATUS EXAMINATION: Adequately groomed gentleman, looks his stated age, cooperative as best he can be with the interview. Eye contact intermittent. Psychomotor activity normal. Speech is a little bit hard to understand. He has a bit of a speech impediment, which is chronic, not loud or pressured. Affect slightly anxious. Does not appear to be obviously euphoric. He says he has been feeling good. Thoughts are scattered and disorganized and marked by hyperreligiosity. Hard to really get a reliable history from him. He indicates that he hears and sees things, although it is hard to pin him down about it. He denies suicidal or homicidal ideation. He is alert and oriented x 4. He can repeat 3 words with some effort but remembers none of them at 3 minutes. Judgment and insight have some chronic impairments due to his illness.   LABORATORY RESULTS: Drug screen negative. Chemistry panel shows a slightly low BUN, low AST, low ALT. Lithium level was elevated at 1.49. Valproic acid level normal at 70. CBC: Low hematocrit at 36.6, low hemoglobin 11.8. Urinalysis all normal.   VITAL SIGNS: Blood pressure 122/78, respirations 18, pulse 114, temperature 97.6.   ASSESSMENT: A 29 year old man with schizoaffective disorder who has been more animated with some hypomanic symptoms for the last couple of days. Culminated in walking away from the group home, which is not necessarily overtly dangerous but is out of character for him. I would agree with what the group home seems to be thinking, that these are warning signs that maybe he is on the brink of getting much worse. We cannot identify any actual reason why he is having a worsening of his symptoms right now. If we had a bed available, I would probably go ahead and admit him to the hospital given how sick he has been in the past. Right now I do not think we have a bed available.   TREATMENT PLAN: Continue his current medications but I am going to go with the doses  of lithium that he was on when he left here last summer which is 600 mg twice a day. Also, we can go with the clozapine at 500 mg a day. We will try to get collateral history if there is anything more that we can find out that may explain the situation. If a bed turns out to be available today, we can admit him. I am not sure what we can do as far as any change to his medicine right now if we were to want to admit him given his Depakote and lithium levels already. Continue to re-evaluate.   DIAGNOSIS, PRINCIPAL AND PRIMARY: AXIS I: Schizoaffective disorder, bipolar type hypomanic.   SECONDARY DIAGNOSES:  AXIS I: No further.  AXIS II: Chronic developmental disability.  AXIS III: Mild glucose intolerance, constipation.    ____________________________ Audery AmelJohn T. Clapacs, MD [email or web address] D: 05/05/2014 12:24:00 ET T: 05/05/2014 13:24:12 ET JOB#: [id number]  cc: Audery AmelJohn T. Clapacs, MD, <Dictator> Audery AmelJOHN T CLAPACS MD ELECTRONICALLY SIGNED 05/13/2014 10:38

## 2014-07-06 NOTE — Consult Note (Signed)
Psychiatry: On reevaluation patient is calm although still hyperreligious. Not violent or threatening and able to control behavior. Will suggest discharge back to group home with follow up as usual.  Electronic Signatures: Clapacs, Jackquline DenmarkJohn T (MD)  (Signed on 01-Mar-16 16:34)  Authored  Last Updated: 01-Mar-16 16:34 by Audery Amellapacs, John T (MD)

## 2015-03-07 ENCOUNTER — Encounter (HOSPITAL_COMMUNITY): Payer: Self-pay

## 2015-03-07 ENCOUNTER — Emergency Department (HOSPITAL_COMMUNITY)
Admission: EM | Admit: 2015-03-07 | Discharge: 2015-03-09 | Disposition: A | Discharge status: Home / Self Care | Payer: Medicare Other | Attending: Emergency Medicine | Admitting: Emergency Medicine

## 2015-03-07 DIAGNOSIS — R000 Tachycardia, unspecified: Secondary | ICD-10-CM | POA: Insufficient documentation

## 2015-03-07 DIAGNOSIS — Z79899 Other long term (current) drug therapy: Secondary | ICD-10-CM | POA: Diagnosis not present

## 2015-03-07 DIAGNOSIS — F3112 Bipolar disorder, current episode manic without psychotic features, moderate: Secondary | ICD-10-CM | POA: Diagnosis not present

## 2015-03-07 DIAGNOSIS — R451 Restlessness and agitation: Secondary | ICD-10-CM | POA: Diagnosis not present

## 2015-03-07 DIAGNOSIS — F209 Schizophrenia, unspecified: Secondary | ICD-10-CM | POA: Insufficient documentation

## 2015-03-07 DIAGNOSIS — Z046 Encounter for general psychiatric examination, requested by authority: Secondary | ICD-10-CM | POA: Diagnosis present

## 2015-03-07 DIAGNOSIS — Z91040 Latex allergy status: Secondary | ICD-10-CM | POA: Diagnosis not present

## 2015-03-07 HISTORY — DX: Schizoaffective disorder, bipolar type: F25.0

## 2015-03-07 HISTORY — DX: Bipolar disorder, unspecified: F31.9

## 2015-03-07 HISTORY — DX: Schizoaffective disorder, unspecified: F25.9

## 2015-03-07 HISTORY — DX: Unspecified convulsions: R56.9

## 2015-03-07 HISTORY — DX: Unspecified intellectual disabilities: F79

## 2015-03-07 LAB — CBC WITH DIFFERENTIAL/PLATELET
BASOS ABS: 0 10*3/uL (ref 0.0–0.1)
Basophils Relative: 0 %
EOS PCT: 1 %
Eosinophils Absolute: 0.1 10*3/uL (ref 0.0–0.7)
HCT: 38.9 % — ABNORMAL LOW (ref 39.0–52.0)
Hemoglobin: 12.4 g/dL — ABNORMAL LOW (ref 13.0–17.0)
LYMPHS ABS: 2.4 10*3/uL (ref 0.7–4.0)
LYMPHS PCT: 31 %
MCH: 29 pg (ref 26.0–34.0)
MCHC: 31.9 g/dL (ref 30.0–36.0)
MCV: 91.1 fL (ref 78.0–100.0)
MONO ABS: 0.5 10*3/uL (ref 0.1–1.0)
MONOS PCT: 7 %
Neutro Abs: 4.7 10*3/uL (ref 1.7–7.7)
Neutrophils Relative %: 61 %
PLATELETS: 228 10*3/uL (ref 150–400)
RBC: 4.27 MIL/uL (ref 4.22–5.81)
RDW: 13.8 % (ref 11.5–15.5)
WBC: 7.7 10*3/uL (ref 4.0–10.5)

## 2015-03-07 LAB — RAPID URINE DRUG SCREEN, HOSP PERFORMED
AMPHETAMINES: NOT DETECTED
BARBITURATES: NOT DETECTED
Benzodiazepines: NOT DETECTED
Cocaine: NOT DETECTED
Opiates: NOT DETECTED
TETRAHYDROCANNABINOL: NOT DETECTED

## 2015-03-07 LAB — ETHANOL

## 2015-03-07 LAB — COMPREHENSIVE METABOLIC PANEL
ALT: 18 U/L (ref 17–63)
AST: 21 U/L (ref 15–41)
Albumin: 4.4 g/dL (ref 3.5–5.0)
Alkaline Phosphatase: 68 U/L (ref 38–126)
Anion gap: 8 (ref 5–15)
BUN: 11 mg/dL (ref 6–20)
CHLORIDE: 105 mmol/L (ref 101–111)
CO2: 27 mmol/L (ref 22–32)
CREATININE: 1.06 mg/dL (ref 0.61–1.24)
Calcium: 9.4 mg/dL (ref 8.9–10.3)
GFR calc Af Amer: >60 mL/min (ref >60)
GLUCOSE: 86 mg/dL (ref 65–99)
Potassium: 4 mmol/L (ref 3.5–5.1)
Sodium: 140 mmol/L (ref 135–145)
Total Bilirubin: 0.4 mg/dL (ref 0.3–1.2)
Total Protein: 7.4 g/dL (ref 6.5–8.1)

## 2015-03-07 LAB — ACETAMINOPHEN LEVEL

## 2015-03-07 LAB — LITHIUM LEVEL: Lithium Lvl: 0.37 mmol/L — ABNORMAL LOW (ref 0.60–1.20)

## 2015-03-07 LAB — SALICYLATE LEVEL

## 2015-03-07 MED ORDER — SODIUM CHLORIDE 0.9 % IV BOLUS (SEPSIS): 1000.0000 mL | Freq: Once | INTRAVENOUS | Status: DC

## 2015-03-07 NOTE — BH Assessment (Signed)
Consulted with Dahlia ByesJosephine Onuoha, NP who recommends observation overnight and evaluate by psychiatry in the morning. Informed EDP of recommendation.   Davina PokeJoVea Chia Rock, LCSW Therapeutic Triage Specialist [facility] 03/07/2015 6:02 PM'

## 2015-03-07 NOTE — ED Notes (Signed)
Bed: [id number] Expected date:  Expected time:  Means of arrival:  Comments: [facility] patient EKG

## 2015-03-07 NOTE — ED Provider Notes (Signed)
CSN: [id number]     Arrival date & time 03/07/15  1422 History   First MD Initiated Contact with Patient 03/07/15 1506     Chief Complaint  Patient presents with  . Agitation  . Medical Clearance   HPI Jack [name] is a 29 y.o. M PMH significant for mental retardation, schizophrenia, bipolar I disorder, seizures presenting with a 1 day history of agitation. He states over the last few days, a woman that works at his facility has been giving him excessive medication, around 12 pills when he should be getting 3-4. He states that he is having a seizure, and that he needs to get baptized to make his seizures go away. 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 He states he is supposed to speak tonight, as well as get baptized at his church. When asked about which church, he discusses a pastor that made a positive influence in his life as a child but does not name the church he goes to. When asked about hallucinations, he states he feels a pressure, an evil presence, but he does not see or hear things other people do not. He brought anointing oil and a Bible with him to the ED. He denies SI, HI. No HAs, CP, abdominal pain, N/V, change in bowel/bladder habits.    Past Medical History  Diagnosis Date  . Mental retardation   . Schizophrenia, schizo-affective (HCC)   . Bipolar 1 disorder (HCC)   . Intellectual disability   . Seizures (HCC)    History reviewed. No pertinent past surgical history. History reviewed. No pertinent family history. Social History  Substance Use Topics  . Smoking status: Never Smoker   . Smokeless tobacco: None  . Alcohol Use: No    Review of Systems  Ten systems are reviewed and are negative for acute change except as noted in the HPI   Allergies  Iodinated diagnostic agents; Latex; and Shellfish-derived products  Home Medications   Prior to Admission medications   Medication Sig Start Date End Date Taking? Authorizing  Provider  cloZAPine (CLOZARIL) 100 MG tablet Take 100-300 mg by mouth 2 (two) times daily. Take 100 mg every morning.  Take 300 mg every night.   Yes Historical Provider, MD  lithium carbonate 300 MG capsule Take 900 mg by mouth at bedtime.   Yes Historical Provider, MD  trihexyphenidyl (ARTANE) 2 MG tablet Take 2 mg by mouth 2 (two) times daily with a meal.   Yes Historical Provider, MD   BP 136/86 mmHg  Pulse 125  Temp(Src) 98.7 F (37.1 C) (Oral)  Resp 18  SpO2 97% Physical Exam  Constitutional: He appears well-developed and well-nourished. No distress.  HENT:  Head: Normocephalic and atraumatic.  Mouth/Throat: Oropharynx is clear and moist. No oropharyngeal exudate.  Eyes: Conjunctivae are normal. Pupils are equal, round, and reactive to light. Right eye exhibits no discharge. Left eye exhibits no discharge. No scleral icterus.  Neck: No tracheal deviation present.  Cardiovascular: Normal rate, regular rhythm, normal heart sounds and intact distal pulses.  Exam reveals no gallop and no friction rub.   No murmur heard. Pulmonary/Chest: Effort normal and breath sounds normal. No respiratory distress. He has no wheezes. He has no rales. He exhibits no tenderness.  Abdominal: Soft. Bowel sounds are normal. He exhibits no distension and no mass. There is no tenderness. There is no rebound and no guarding.  Musculoskeletal: He exhibits no edema.  Lymphadenopathy:  He has no cervical adenopathy.  Neurological: He is alert. Coordination normal.  No tremors, EPS symptoms.   Skin: Skin is warm and dry. No rash noted. He is not diaphoretic. No erythema.  Psychiatric:  Paranoid thoughts. Rambling speech.   Nursing note and vitals reviewed.   ED Course  Procedures (including critical care time) Labs Review Labs Reviewed  CBC WITH DIFFERENTIAL/PLATELET - Abnormal; Notable for the following:    Hemoglobin 12.4 (*)    HCT 38.9 (*)    All other components within normal limits   ACETAMINOPHEN LEVEL - Abnormal; Notable for the following:    Acetaminophen (Tylenol), Serum <10 (*)    All other components within normal limits  LITHIUM LEVEL - Abnormal; Notable for the following:    Lithium Lvl 0.37 (*)    All other components within normal limits  COMPREHENSIVE METABOLIC PANEL  ETHANOL  URINE RAPID DRUG SCREEN, HOSP PERFORMED  SALICYLATE LEVEL   MDM   Final diagnoses:  Bipolar 1 disorder, manic, moderate (HCC)  Agitation   Patient non-toxic appearing. Tachycardic. Will give IVF, obtain basic labs, and lithium level. CMP, ethanol, CBC, salicylate, acetaminophen, UDS unremarkable. Low lithium level. Placed consult with TTS. Patient medically cleared at this time.   Melton KrebsSamantha Nicole Leyland Kenna, PA-C 03/15/15 1807  Lavera Guiseana Duo Liu, MD 03/15/15 [phone]
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
## 2015-03-07 NOTE — ED Notes (Signed)
Pt's mother just called.  She has been updated that pt will at minimum stay overnight and be evaluated in the am by psych.  Pt is calm and cooperative in room watching TV with sitter watching.

## 2015-03-07 NOTE — ED Notes (Signed)
He remains calm in the presence of his sitter; and I have given report to [employer] RN and he ambulates to rm. 30 without incident.

## 2015-03-07 NOTE — ED Notes (Signed)
Pt alert to self, talking to people that are not in the room. He is currently having religious delusions. Currently watching TV, will continue to monitor.

## 2015-03-07 NOTE — ED Notes (Signed)
Pt has coat, shirt, under shirt, pants, socks, shoes, watch, bible, portable cd player, and a bottle of olive oil.  Pt has been seen and wanded by security.

## 2015-03-07 NOTE — ED Notes (Signed)
Per TTS counselor, pt will stay at minimum overnight for observation and be evaluated by psych in the am.

## 2015-03-07 NOTE — ED Notes (Signed)
Bed: WA30 Expected date:  Expected time:  Means of arrival:  Comments: 
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
## 2015-03-07 NOTE — ED Notes (Signed)
Delay in lab draw, pt with TTS 
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
## 2015-03-08 DIAGNOSIS — F3112 Bipolar disorder, current episode manic without psychotic features, moderate: Secondary | ICD-10-CM | POA: Diagnosis not present

## 2015-03-08 MED ORDER — CLOZAPINE 100 MG PO TABS
300.0000 mg | ORAL_TABLET | Freq: Every day | ORAL | Status: DC
  Administered 2015-03-08: 300 mg via ORAL
  Filled 2015-03-08 (×2): qty 3

## 2015-03-08 MED ORDER — LITHIUM CARBONATE 300 MG PO CAPS
900.0000 mg | ORAL_CAPSULE | Freq: Every day | ORAL | Status: DC
  Administered 2015-03-08: 900 mg via ORAL
  Filled 2015-03-08: qty 3

## 2015-03-08 MED ORDER — TRIHEXYPHENIDYL HCL 2 MG PO TABS
2.0000 mg | ORAL_TABLET | Freq: Two times a day (BID) | ORAL | Status: DC
  Administered 2015-03-08 – 2015-03-09 (×2): 2 mg via ORAL
  Filled 2015-03-08 (×4): qty 1

## 2015-03-08 MED ORDER — CLOZAPINE 100 MG PO TABS
100.0000 mg | ORAL_TABLET | Freq: Every day | ORAL | Status: DC
Start: 2015-03-08 — End: 2015-03-09
  Administered 2015-03-08 – 2015-03-09 (×2): 100 mg via ORAL
  Filled 2015-03-08 (×2): qty 1

## 2015-03-08 NOTE — BHH Counselor (Signed)
Patients mother [name] came to visit the patient and signed ROI to [facility] and the patients group home. She states that there should be information regarding her guardianship in the chart. Reviewed the chart and this writer did not see documentation of guardianship.  Patients mother informed and states that she will bring the information tomorrow. Patients mother states that she can bring IQ score from when the patient was about 30 years old if needed.    Patients IQ score/Psychological found in the Media tab as a correspondence document type on 12/21/2010 and patients mother states that this is the most recent IQ score.   ROI left in patient chart and mother informed that we are unable to release information about patients care until we have legal documentation of her guardianship and we will hold on to the ROI. Patient signed ROI as well and states that he is fine with us communicating [facility].   Davina PokeJoVea Rami Budhu, LCSW Therapeutic Triage Specialist [facility] 03/08/2015 6:04 PM

## 2015-03-08 NOTE — BHH Counselor (Addendum)
Contacted patients mom [name] at [phone]  Who is his guardian to request that she sign a release to speak with [facility] patient being taken off of Depakote. There was no identifying information on the voicemail. Left message with this writers name and a request to call back [phone] to provide this information.   Davina PokeJoVea Nioka Thorington, LCSW Therapeutic Triage Specialist [facility] 03/08/2015 3:06 PM   Addendum:  Patients mother called back and states that she will come later today to sign [facility]. Will inform patients nurse to notify TTS once she arrives.   Davina PokeJoVea Britten Parady, LCSW Therapeutic Triage Specialist [facility] 03/08/2015 3:09 PM

## 2015-03-08 NOTE — ED Notes (Signed)
Pt paces in room speaking to people that are not in the room

## 2015-03-08 NOTE — ED Notes (Addendum)
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 Pt was placed in a hospital bed. 9:15p Pt was very cooperative and took his medications. Pt is asleep with regular respirations. (10:10pm)

## 2015-03-08 NOTE — Progress Notes (Signed)
[facility] called about pt. States that they are full today--tomorrow they should have discharges. [facility] requesting we call them back tomorrow to check on bed status.  York SpanielAlexandra Hasaan Radde [facility] Clinical Social Worker [facility] phone: [phone]

## 2015-03-08 NOTE — Consult Note (Signed)
[place] [facility] Consult   Reason for Consult:  Delusion, manic behavior, agitation Referring Physician:  EDP Patient Identification: Jack [name] MRN:  [id number] Principal Diagnosis: Bipolar 1 disorder, manic, moderate ([place]) Diagnosis:   Patient Active Problem List   Diagnosis Date Noted  . Bipolar 1 disorder, manic, moderate ([facility]) [F31.12] 03/08/2015    Priority: High    Total Time spent with patient: 45 minutes  Subjective:   Jack [name] is a 30 y.o. male patient admitted with  Delusion, manic behavior, agitation  HPI:  Jack [name] male, 30 years old from a group home, brought in by [facility] for agitation.  Patient has a hx of MR  and  Seizure.  He has been talking to himself and has become hyper religious.  Today patient reported that his group home staff is giving him too much medications in other to kill him and take huis money.  Patient asked providers to check computers to find out how much money he has.  Patient was quoting the Bible and asked this Probation officer if she is an Oncologist and believe in God.  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 Patient had his Bible in the room with him.  Patient was calm and cooperative but exhibited pressured speech and flight of ideas.  He was talkative and needed to be stopped to re-direct him back to the topic in discussion.  He reports increased grandma Seizures since stopping his Depakote.  Patient's mother reported that his Depakote was stopped by [facility].   At this time she does not know the reasons why Depakote was stopped.  His Lithium level is sub-therapeutic.  He denies SI/HI/AVH.  He has been accepted for admission and we will seek placement at facilities that takes care of patients that are Mentally Challenged.  Past Psychiatric History: Bipolar disorder,   Risk to Self: Suicidal Ideation: No Suicidal Intent: No Is patient at risk for suicide?: No Suicidal Plan?: No Access to Means:  No What has been your use of drugs/alcohol within the last 12 months?: Denies Other Self Harm Risks: Denies Triggers for Past Attempts: Family contact (step father) Intentional Self Injurious Behavior: None Risk to Others: Homicidal Ideation: No Thoughts of Harm to Others: No Current Homicidal Intent: No Current Homicidal Plan: No Access to Homicidal Means: No History of harm to others?: No Assessment of Violence: None Noted Does patient have access to weapons?: No Criminal Charges Pending?: No Does patient have a court date: No Prior Inpatient Therapy: Prior Inpatient Therapy: Yes Prior Therapy Dates: Multiple Prior Therapy Facilty/Provider(s): Multiple Reason for Treatment: Schizoafffective Disorder Prior Outpatient Therapy: Prior Outpatient Therapy: Yes Prior Therapy Dates: [facility] Prior Therapy Facilty/Provider(s): "Mental Health Reason for Treatment: Schizoaffective Disorder Does patient have an ACCT team?: No Does patient have Intensive In-House Services?  : No Does patient have [facility] services? : No Does patient have P4CC services?: No  Past Medical History:  Past Medical History  Diagnosis Date  . Mental retardation   . Schizophrenia, schizo-affective ([place])   . Bipolar 1 disorder (Jack [name])   . Intellectual disability   . Seizures ([place])    History reviewed. No pertinent past surgical history. Family History: History reviewed. No pertinent family history. Family Psychiatric  History:   DENIES. Social History:  History  Alcohol Use No     History  Drug Use No    Social History   Social History  . Marital Status: Single  Spouse Name: N/A  . Number of Children: N/A  . Years of Education: N/A   Social History Main Topics  . Smoking status: Never Smoker   . Smokeless tobacco: None  . Alcohol Use: No  . Drug Use: No  . Sexual Activity: Not Asked   Other Topics Concern  . None   Social History Narrative  . None   Additional Social History:    Pain  Medications: See PTA Prescriptions: See PTA Over the Counter: See PTA History of alcohol / drug use?: No history of alcohol / drug abuse                     Allergies:   Allergies  Allergen Reactions  . Iodinated Diagnostic Agents     Reaction unknown--per [name].    . Latex     Reaction unknown--per [facility].    [employer]     Reaction unknown--per [facility].      Labs:  Results for orders placed or performed during the hospital encounter of 03/07/15 (from the past 48 hour(s))  Urine rapid drug screen (hosp performed)not at [facility]     Status: None   Collection Time: 03/07/15  4:29 PM  Result Value Ref Range   Opiates NONE DETECTED NONE DETECTED   Cocaine NONE DETECTED NONE DETECTED   Benzodiazepines NONE DETECTED NONE DETECTED   Amphetamines NONE DETECTED NONE DETECTED   Tetrahydrocannabinol NONE DETECTED NONE DETECTED   Barbiturates NONE DETECTED NONE DETECTED    Comment:        DRUG SCREEN FOR MEDICAL PURPOSES ONLY.  IF CONFIRMATION IS NEEDED FOR ANY PURPOSE, NOTIFY LAB WITHIN 5 DAYS.        LOWEST DETECTABLE LIMITS FOR URINE DRUG SCREEN Drug Class       Cutoff (ng/mL) Amphetamine      1000 Barbiturate      200 Benzodiazepine   732 Tricyclics       202 Opiates          300 Cocaine          300 THC              50   Comprehensive metabolic panel     Status: None   Collection Time: 03/07/15  4:32 PM  Result Value Ref Range   Sodium 140 135 - 145 mmol/L   Potassium 4.0 3.5 - 5.1 mmol/L   Chloride 105 101 - 111 mmol/L   CO2 27 22 - 32 mmol/L   Glucose, Bld 86 65 - 99 mg/dL   BUN 11 6 - 20 mg/dL   Creatinine, Ser 1.06 0.61 - 1.24 mg/dL   Calcium 9.4 8.9 - 10.3 mg/dL   Total Protein 7.4 6.5 - 8.1 g/dL   Albumin 4.4 3.5 - 5.0 g/dL   AST 21 15 - 41 U/L   ALT 18 17 - 63 U/L   Alkaline Phosphatase 68 38 - 126 U/L   Total Bilirubin 0.4 0.3 - 1.2 mg/dL   GFR calc non Af Amer >60 >60 mL/min   GFR calc Af Amer >60 >60  mL/min    Comment: (NOTE) The eGFR has been calculated using the CKD EPI equation. This calculation has not been validated in all clinical situations. eGFR's persistently <60 mL/min signify possible Chronic Kidney Disease.    Anion gap 8 5 - 15  Ethanol     Status: None   Collection Time: 03/07/15  4:32 PM  Result Value Ref Range   Alcohol, Ethyl (B) <5 <5 mg/dL    Comment:        LOWEST DETECTABLE LIMIT FOR SERUM ALCOHOL IS 5 mg/dL FOR MEDICAL PURPOSES ONLY   CBC with Diff     Status: Abnormal   Collection Time: 03/07/15  4:32 PM  Result Value Ref Range   WBC 7.7 4.0 - 10.5 K/uL   RBC 4.27 4.22 - 5.81 MIL/uL   Hemoglobin 12.4 (L) 13.0 - 17.0 g/dL   HCT 38.9 (L) 39.0 - 52.0 %   MCV 91.1 78.0 - 100.0 fL   MCH 29.0 26.0 - 34.0 pg   MCHC 31.9 30.0 - 36.0 g/dL   RDW 13.8 11.5 - 15.5 %   Platelets 228 150 - 400 K/uL   Neutrophils Relative % 61 %   Neutro Abs 4.7 1.7 - 7.7 K/uL   Lymphocytes Relative 31 %   Lymphs Abs 2.4 0.7 - 4.0 K/uL   Monocytes Relative 7 %   Monocytes Absolute 0.5 0.1 - 1.0 K/uL   Eosinophils Relative 1 %   Eosinophils Absolute 0.1 0.0 - 0.7 K/uL   Basophils Relative 0 %   Basophils Absolute 0.0 0.0 - 0.1 K/uL  Salicylate level     Status: None   Collection Time: 03/07/15  4:32 PM  Result Value Ref Range   Salicylate Lvl <7.2 2.8 - 30.0 mg/dL  Acetaminophen level     Status: Abnormal   Collection Time: 03/07/15  4:32 PM  Result Value Ref Range   Acetaminophen (Tylenol), Serum <10 (L) 10 - 30 ug/mL    Comment:        THERAPEUTIC CONCENTRATIONS VARY SIGNIFICANTLY. A RANGE OF 10-30 ug/mL MAY BE AN EFFECTIVE CONCENTRATION FOR MANY PATIENTS. HOWEVER, SOME ARE BEST TREATED AT CONCENTRATIONS OUTSIDE THIS RANGE. ACETAMINOPHEN CONCENTRATIONS >150 ug/mL AT 4 HOURS AFTER INGESTION AND >50 ug/mL AT 12 HOURS AFTER INGESTION ARE OFTEN ASSOCIATED WITH TOXIC REACTIONS.   Lithium level     Status: Abnormal   Collection Time: 03/07/15  4:32 PM  Result  Value Ref Range   Lithium Lvl 0.37 (L) 0.60 - 1.20 mmol/L    No current facility-administered medications for this encounter.   Current Outpatient Prescriptions  Medication Sig Dispense Refill  . cloZAPine (CLOZARIL) 100 MG tablet Take 100-300 mg by mouth 2 (two) times daily. Take 100 mg every morning.  Take 300 mg every night.    . lithium carbonate 300 MG capsule Take 900 mg by mouth at bedtime.    . trihexyphenidyl (ARTANE) 2 MG tablet Take 2 mg by mouth 2 (two) times daily with a meal.      Musculoskeletal: Strength & Muscle Tone: within normal limits Gait & Station: normal Patient leans: N/A  Psychiatric Specialty Exam: Review of Systems  Constitutional: Negative.   HENT: Negative.   Eyes: Negative.   Respiratory: Negative.   Cardiovascular: Negative.   Gastrointestinal: Negative.   Genitourinary: Negative.   Musculoskeletal: Negative.   Skin: Negative.   Neurological: Positive for seizures.  Endo/Heme/Allergies: Negative.     Blood pressure 113/78, pulse 112, temperature 98.2 F (36.8 C), temperature source Oral, resp. rate 18, SpO2 98 %.There is no height or weight on file to calculate BMI.  General Appearance: Casual and Fairly Groomed  Eye Contact::  Good  Speech:  Clear and Coherent and Pressured  Volume:  Normal  Mood:  Euphoric  Affect:  Congruent  Thought Process:  Disorganized, Irrelevant and Tangential  Orientation:  Full (Time, Place, and Person)  Thought Content:  Delusions  Suicidal Thoughts:  No  Homicidal Thoughts:  No  Memory:  Immediate;   Fair Recent;   Fair Remote;   Fair  Judgement:  Impaired  Insight:  Shallow  Psychomotor Activity:  Normal  Concentration:  Fair  Recall:  NA  Fund of Knowledge:Fair  Language: Fair  Akathisia:  No  Handed:  Right  AIMS (if indicated):     Assets:  Desire for Improvement  ADL's:  Intact  Cognition: WNL  Sleep:      Treatment Plan Summary: Daily contact with patient to assess and evaluate symptoms  and progress in treatment and Medication management  Disposition: Accepted for admission, we will seek placement at any facility with available bed.  We will resume home medications.  Delfin Gant   PMHNP-BC 03/08/2015 2:47 PM Agree with NP Note and Assessment, as above

## 2015-03-08 NOTE — Progress Notes (Signed)
Disposition CSW completed patient referrals to the following inpatient psych facilites:  [facility]  CSW will continue to follow patient as needed for placement.  Seward SpeckLeo Megham Dwyer [facility] Care HospitalCSW,LCAS Behavioral Health Disposition CSW [phone]

## 2015-03-08 NOTE — ED Notes (Signed)
MD at bedside. Pt a/ox2, having delusions, pt believes hes "GOD" and at his group home the person that give medications tried to overdose him Pt is eating, took a shower but is not sleeping

## 2015-03-08 NOTE — ED Notes (Signed)
Pt has no medications scheduled, pt sts he has seizures and takes depakote for them, pt sts his last seizure was 3 weeks ago

## 2015-03-09 DIAGNOSIS — F3112 Bipolar disorder, current episode manic without psychotic features, moderate: Secondary | ICD-10-CM

## 2015-03-09 DIAGNOSIS — R451 Restlessness and agitation: Secondary | ICD-10-CM | POA: Diagnosis not present

## 2015-03-09 MED ORDER — LITHIUM CARBONATE 300 MG PO CAPS: 900.0000 mg | ORAL_CAPSULE | Freq: Every day | ORAL | Status: DC

## 2015-03-09 MED ORDER — DIVALPROEX SODIUM 500 MG PO DR TAB: 500.0000 mg | DELAYED_RELEASE_TABLET | Freq: Two times a day (BID) | ORAL | Status: DC

## 2015-03-09 MED ORDER — TRIHEXYPHENIDYL HCL 2 MG PO TABS: 2.0000 mg | ORAL_TABLET | Freq: Two times a day (BID) | ORAL | Status: DC

## 2015-03-09 MED ORDER — CLOZAPINE 100 MG PO TABS: 300.0000 mg | ORAL_TABLET | Freq: Every day | ORAL | Status: DC

## 2015-03-09 MED ORDER — DIVALPROEX SODIUM 500 MG PO DR TAB
500.0000 mg | DELAYED_RELEASE_TABLET | Freq: Two times a day (BID) | ORAL | Status: DC
  Administered 2015-03-09: 500 mg via ORAL
  Filled 2015-03-09: qty 1

## 2015-03-09 MED ORDER — CLOZAPINE 100 MG PO TABS: 100.0000 mg | ORAL_TABLET | Freq: Every day | ORAL | Status: DC

## 2015-03-09 NOTE — [facility] Suicide Risk Assessment (Signed)
Suicide Risk Assessment  Discharge Assessment   [facility]   Demographic Factors:  Male  Total Time spent with patient: 30 minutes  Musculoskeletal: Strength & Muscle Tone: within normal limits Gait & Station: normal Patient leans: N/A  Psychiatric Specialty Exam: Review of Systems  Constitutional: Negative.   HENT: Negative.   Eyes: Negative.   Respiratory: Negative.   Cardiovascular: Negative.   Gastrointestinal: Negative.   Genitourinary: Negative.   Musculoskeletal: Negative.   Skin: Negative.   Neurological: Negative.   Endo/Heme/Allergies: Negative.   Psychiatric/Behavioral:       Negative    Blood pressure 128/78, pulse 98, temperature 97.5 F (36.4 C), temperature source Oral, resp. rate 20, SpO2 94 %.There is no height or weight on file to calculate BMI.  General Appearance: Casual  Eye Contact::  Good  Speech:  Normal Rate  Volume:  Normal  Mood:  Euthymic  Affect:  Congruent  Thought Process:  Coherent  Orientation:  Full (Time, Place, and Person)  Thought Content:  WDL  Suicidal Thoughts:  No  Homicidal Thoughts:  No  Memory:  Immediate;   Good Recent;   Good Remote;   Good  Judgement:  Fair  Insight:  Fair  Psychomotor Activity:  Normal  Concentration:  Good  Recall:  Good  Fund of Knowledge:Fair  Language: Fair  Akathisia:  No  Handed:  Right  AIMS (if indicated):     Assets:  Housing Physical Health Resilience Social Support  ADL's:  Intact  Cognition: Impaired,  Mild  Sleep:         Has this patient used any form of tobacco in the last 30 days? (Cigarettes, Smokeless Tobacco, Cigars, and/or Pipes) No  Mental Status Per Nursing Assessment::   On Admission:   agitation  Current Mental Status by Physician: NA  Loss Factors: NA  Historical Factors: Impulsivity  Risk Reduction Factors:   Sense of responsibility to family, Living with another person, especially a relative, Positive social support and  Positive therapeutic relationship  Continued Clinical Symptoms:  None  Cognitive Features That Contribute To Risk:  None    Suicide Risk:  Minimal: No identifiable suicidal ideation.  Patients presenting with no risk factors but with morbid ruminations; may be classified as minimal risk based on the severity of the depressive symptoms  Principal Problem: Bipolar 1 disorder, manic, moderate (HCC) Discharge Diagnoses:  Patient Active Problem List   Diagnosis Date Noted  . Agitation [R45.1] 03/09/2015    Priority: High  . Bipolar 1 disorder, manic, moderate (HCC) [F31.12] 03/08/2015    Priority: High      Plan Of Care/Follow-up recommendations:  Activity:  as tolerated Diet:  heart healthy diet  Is patient on multiple antipsychotic therapies at discharge:  No   Has Patient had three or more failed trials of antipsychotic monotherapy by history:  No  Recommended Plan for Multiple Antipsychotic Therapies: NA    Musab Wingard, PMH-NP 03/09/2015, 11:57 AM

## 2015-03-09 NOTE — Progress Notes (Signed)
ED RN consulted ED Cm to inquire if this pt's Group home can be assisted with medications for this pt CM reviewed EPIC to confirm pt is covered by united health care and medicaid [place] access There is not a [facility] medication assistance program for an united health care and medicaid [place] access Recommended Group Home get prescriptions filled. Or TCU RN discuss with EDP if inpatient pharmacy can assistance

## 2015-03-09 NOTE — Progress Notes (Signed)
CSW spoke with [name], [phone] mother of patient to obtain the name of the group home. Patient's mother stated the name of the group home is [facility].   CSW informed the mother that guardianship papers were still needed. Patient's mother asked if she could fax the papers. CSW provided patient with fax# [phone] to send guardianship papers.  Elenore PaddyLaVonia Stephaun Million, LCSWA [phone] ED CSW 03/09/2015 12:40 PM

## 2015-03-09 NOTE — Consult Note (Signed)
[facility] Consult   Reason for Consult:  Agitation  Referring Physician:  EDP Patient Identification: Jack [name] MRN:  [id number] Principal Diagnosis: Bipolar 1 disorder, manic, moderate ([place]) Diagnosis:   Patient Active Problem List   Diagnosis Date Noted  . Agitation [R45.1] 03/09/2015    Priority: High  . Bipolar 1 disorder, manic, moderate ([place]) [F31.12] 03/08/2015    Priority: High    Total Time spent with patient: 30 minutes  Subjective:   Jack [name] is a 30 y.o. male patient does not warrant admission.  HPI:  On admission:  30 years old from a group home, brought in by [facility] for agitation. Patient has a hx of MR and Seizure. He has been talking to himself and has become hyper religious. Today patient reported that his group home staff is giving him too much medications in other to kill him and take huis money. Patient asked providers to check computers to find out how much money he has. Patient was quoting the Bible and asked this Probation officer if she is an Oncologist and believe in God. 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 Patient had his Bible in the room with him. Patient was calm and cooperative but exhibited pressured speech and flight of ideas. He was talkative and needed to be stopped to re-direct him back to the topic in discussion. He reports increased grandma Seizures since stopping his Depakote. Patient's mother reported that his Depakote was stopped by [facility]. At this time she does not know the reasons why Depakote was stopped. His Lithium level is sub-therapeutic. He denies SI/HI/AVH. He has been accepted for admission and we will seek placement at facilities that takes care of patients that are Mentally Challenged.  Today:  30 yo is calm, cooperative and at his baseline which is delusional--hyperreligious.  Denies suicidal/homicidal ideations, hallucinations, and alcohol/drug  abuse--stable for discharge to group home.  Past Psychiatric History: Bipolar disorder  Risk to Self: Suicidal Ideation: No Suicidal Intent: No Is patient at risk for suicide?: No Suicidal Plan?: No Access to Means: No What has been your use of drugs/alcohol within the last 12 months?: Denies Other Self Harm Risks: Denies Triggers for Past Attempts: Family contact (step father) Intentional Self Injurious Behavior: None Risk to Others: Homicidal Ideation: No Thoughts of Harm to Others: No Current Homicidal Intent: No Current Homicidal Plan: No Access to Homicidal Means: No History of harm to others?: No Assessment of Violence: None Noted Does patient have access to weapons?: No Criminal Charges Pending?: No Does patient have a court date: No Prior Inpatient Therapy: Prior Inpatient Therapy: Yes Prior Therapy Dates: Multiple Prior Therapy Facilty/Provider(s): Multiple Reason for Treatment: Schizoafffective Disorder Prior Outpatient Therapy: Prior Outpatient Therapy: Yes Prior Therapy Dates: [facility] Prior Therapy Facilty/Provider(s): "Mental Health Reason for Treatment: Schizoaffective Disorder Does patient have an ACCT team?: No Does patient have Intensive In-House Services?  : No Does patient have [facility] services? : No Does patient have P4CC services?: No  Past Medical History:  Past Medical History  Diagnosis Date  . Mental retardation   . Schizophrenia, schizo-affective ([place])   . Bipolar 1 disorder ([place])   . Intellectual disability   . Seizures ([place])    History reviewed. No pertinent past surgical history. Family History: History reviewed. No pertinent family history. Family Psychiatric  History: None Social History:  History  Alcohol Use No     History  Drug Use No  Social History   Social History  . Marital Status: Single    Spouse Name: N/A  . Number of Children: N/A  . Years of Education: N/A   Social History Main Topics  . Smoking status: Never  Smoker   . Smokeless tobacco: None  . Alcohol Use: No  . Drug Use: No  . Sexual Activity: Not Asked   Other Topics Concern  . None   Social History Narrative  . None   Additional Social History:    Pain Medications: See PTA Prescriptions: See PTA Over the Counter: See PTA History of alcohol / drug use?: No history of alcohol / drug abuse                     Allergies:   Allergies  Allergen Reactions  . Iodinated Diagnostic Agents     Reaction unknown--per [facility].    . Latex     Reaction unknown--per [facility].    [employer]     Reaction unknown--per [facility].      Labs:  Results for orders placed or performed during the hospital encounter of 03/07/15 (from the past 48 hour(s))  Urine rapid drug screen (hosp performed)not at [facility]     Status: None   Collection Time: 03/07/15  4:29 PM  Result Value Ref Range   Opiates NONE DETECTED NONE DETECTED   Cocaine NONE DETECTED NONE DETECTED   Benzodiazepines NONE DETECTED NONE DETECTED   Amphetamines NONE DETECTED NONE DETECTED   Tetrahydrocannabinol NONE DETECTED NONE DETECTED   Barbiturates NONE DETECTED NONE DETECTED    Comment:        DRUG SCREEN FOR MEDICAL PURPOSES ONLY.  IF CONFIRMATION IS NEEDED FOR ANY PURPOSE, NOTIFY LAB WITHIN 5 DAYS.        LOWEST DETECTABLE LIMITS FOR URINE DRUG SCREEN Drug Class       Cutoff (ng/mL) Amphetamine      1000 Barbiturate      200 Benzodiazepine   409 Tricyclics       811 Opiates          300 Cocaine          300 THC              50   Comprehensive metabolic panel     Status: None   Collection Time: 03/07/15  4:32 PM  Result Value Ref Range   Sodium 140 135 - 145 mmol/L   Potassium 4.0 3.5 - 5.1 mmol/L   Chloride 105 101 - 111 mmol/L   CO2 27 22 - 32 mmol/L   Glucose, Bld 86 65 - 99 mg/dL   BUN 11 6 - 20 mg/dL   Creatinine, Ser 1.06 0.61 - 1.24 mg/dL   Calcium 9.4 8.9 - 10.3 mg/dL   Total Protein 7.4 6.5 - 8.1 g/dL    Albumin 4.4 3.5 - 5.0 g/dL   AST 21 15 - 41 U/L   ALT 18 17 - 63 U/L   Alkaline Phosphatase 68 38 - 126 U/L   Total Bilirubin 0.4 0.3 - 1.2 mg/dL   GFR calc non Af Amer >60 >60 mL/min   GFR calc Af Amer >60 >60 mL/min    Comment: (NOTE) The eGFR has been calculated using the CKD EPI equation. This calculation has not been validated in all clinical situations. eGFR's persistently <60 mL/min signify possible Chronic Kidney Disease.    Anion gap 8 5 - 15  Ethanol  Status: None   Collection Time: 03/07/15  4:32 PM  Result Value Ref Range   Alcohol, Ethyl (B) <5 <5 mg/dL    Comment:        LOWEST DETECTABLE LIMIT FOR SERUM ALCOHOL IS 5 mg/dL FOR MEDICAL PURPOSES ONLY   CBC with Diff     Status: Abnormal   Collection Time: 03/07/15  4:32 PM  Result Value Ref Range   WBC 7.7 4.0 - 10.5 K/uL   RBC 4.27 4.22 - 5.81 MIL/uL   Hemoglobin 12.4 (L) 13.0 - 17.0 g/dL   HCT 38.9 (L) 39.0 - 52.0 %   MCV 91.1 78.0 - 100.0 fL   MCH 29.0 26.0 - 34.0 pg   MCHC 31.9 30.0 - 36.0 g/dL   RDW 13.8 11.5 - 15.5 %   Platelets 228 150 - 400 K/uL   Neutrophils Relative % 61 %   Neutro Abs 4.7 1.7 - 7.7 K/uL   Lymphocytes Relative 31 %   Lymphs Abs 2.4 0.7 - 4.0 K/uL   Monocytes Relative 7 %   Monocytes Absolute 0.5 0.1 - 1.0 K/uL   Eosinophils Relative 1 %   Eosinophils Absolute 0.1 0.0 - 0.7 K/uL   Basophils Relative 0 %   Basophils Absolute 0.0 0.0 - 0.1 K/uL  Salicylate level     Status: None   Collection Time: 03/07/15  4:32 PM  Result Value Ref Range   Salicylate Lvl <2.8 2.8 - 30.0 mg/dL  Acetaminophen level     Status: Abnormal   Collection Time: 03/07/15  4:32 PM  Result Value Ref Range   Acetaminophen (Tylenol), Serum <10 (L) 10 - 30 ug/mL    Comment:        THERAPEUTIC CONCENTRATIONS VARY SIGNIFICANTLY. A RANGE OF 10-30 ug/mL MAY BE AN EFFECTIVE CONCENTRATION FOR MANY PATIENTS. HOWEVER, SOME ARE BEST TREATED AT CONCENTRATIONS OUTSIDE THIS RANGE. ACETAMINOPHEN  CONCENTRATIONS >150 ug/mL AT 4 HOURS AFTER INGESTION AND >50 ug/mL AT 12 HOURS AFTER INGESTION ARE OFTEN ASSOCIATED WITH TOXIC REACTIONS.   Lithium level     Status: Abnormal   Collection Time: 03/07/15  4:32 PM  Result Value Ref Range   Lithium Lvl 0.37 (L) 0.60 - 1.20 mmol/L    Current Facility-Administered Medications  Medication Dose Route Frequency Provider Last Rate Last Dose  . cloZAPine (CLOZARIL) tablet 100 mg  100 mg Oral Daily Delfin Gant, NP   100 mg at 03/09/15 0914  . cloZAPine (CLOZARIL) tablet 300 mg  300 mg Oral QHS Delfin Gant, NP   300 mg at 03/08/15 2058  . divalproex (DEPAKOTE) DR tablet 500 mg  500 mg Oral Q12H Patrecia Pour, NP   500 mg at 03/09/15 0946  . lithium carbonate capsule 900 mg  900 mg Oral QHS Delfin Gant, NP   900 mg at 03/08/15 2059  . trihexyphenidyl (ARTANE) tablet 2 mg  2 mg Oral BID WC Delfin Gant, NP   2 mg at 03/09/15 7867   Current Outpatient Prescriptions  Medication Sig Dispense Refill  . cloZAPine (CLOZARIL) 100 MG tablet Take 100-300 mg by mouth 2 (two) times daily. Take 100 mg every morning.  Take 300 mg every night.    . lithium carbonate 300 MG capsule Take 900 mg by mouth at bedtime.    . trihexyphenidyl (ARTANE) 2 MG tablet Take 2 mg by mouth 2 (two) times daily with a meal.      Musculoskeletal: Strength & Muscle Tone: within normal limits  Gait & Station: normal Patient leans: N/A  Psychiatric Specialty Exam: Review of Systems  Constitutional: Negative.   HENT: Negative.   Eyes: Negative.   Respiratory: Negative.   Cardiovascular: Negative.   Gastrointestinal: Negative.   Genitourinary: Negative.   Musculoskeletal: Negative.   Skin: Negative.   Neurological: Negative.   Endo/Heme/Allergies: Negative.   Psychiatric/Behavioral:       Negative    Blood pressure 128/78, pulse 98, temperature 97.5 F (36.4 C), temperature source Oral, resp. rate 20, SpO2 94 %.There is no height or weight on  file to calculate BMI.  General Appearance: Casual  Eye Contact::  Good  Speech:  Normal Rate  Volume:  Normal  Mood:  Euthymic  Affect:  Congruent  Thought Process:  Coherent  Orientation:  Full (Time, Place, and Person)  Thought Content:  WDL  Suicidal Thoughts:  No  Homicidal Thoughts:  No  Memory:  Immediate;   Good Recent;   Good Remote;   Good  Judgement:  Fair  Insight:  Fair  Psychomotor Activity:  Normal  Concentration:  Good  Recall:  Good  Fund of Knowledge:Fair  Language: Fair  Akathisia:  No  Handed:  Right  AIMS (if indicated):     Assets:  Housing Physical Health Resilience Social Support  ADL's:  Intact  Cognition: Impaired,  Mild  Sleep:      Treatment Plan Summary: Daily contact with patient to assess and evaluate symptoms and progress in treatment, Medication management and Plan bipolar affective disorder with agitation : -Crisis stabilization -Medication management:  Restarted his Clozaril 100 mg daily and 300 mg at bedtime for psychosis, Artane 2 mg BID to prevent EPS, and Lithium 900 mg at bedtime for mood stabilization.  Started Depakote 500 mg BID for seizure disorder -Individual counseling  Disposition: No evidence of imminent risk to self or others at present.    Waylan Boga, [place] 03/09/2015 11:30 AM Patient seen face-to-face for psychiatric evaluation, chart reviewed and case discussed with the physician extender and developed treatment plan. Reviewed the information documented and agree with the treatment plan. Corena Pilgrim, MD

## 2015-03-09 NOTE — Discharge Instructions (Signed)
Bipolar Disorder °Bipolar disorder is a mental illness. The term bipolar disorder actually is used to describe a group of disorders that all share varying degrees of emotional highs and lows that can interfere with daily functioning, such as work, school, or relationships. Bipolar disorder also can lead to drug abuse, hospitalization, and suicide. °The emotional highs of bipolar disorder are periods of elation or irritability and high energy. These highs can range from a mild form (hypomania) to a severe form (mania). People experiencing episodes of hypomania may appear energetic, excitable, and highly productive. People experiencing mania may behave impulsively or erratically. They often make poor decisions. They may have difficulty sleeping. The most severe episodes of mania can involve having very distorted beliefs or perceptions about the world and seeing or hearing things that are not real (psychotic delusions and hallucinations).  °The emotional lows of bipolar disorder (depression) also can range from mild to severe. Severe episodes of bipolar depression can involve psychotic delusions and hallucinations. °Sometimes people with bipolar disorder experience a state of mixed mood. Symptoms of hypomania or mania and depression are both present during this mixed-mood episode. °SIGNS AND SYMPTOMS °There are signs and symptoms of the episodes of hypomania and mania as well as the episodes of depression. The signs and symptoms of hypomania and mania are similar but vary in severity. They include: °· Inflated self-esteem or feeling of increased self-confidence. °· Decreased need for sleep. °· Unusual talkativeness (rapid or pressured speech) or the feeling of a need to keep talking. °· Sensation of racing thoughts or constant talking, with quick shifts between topics that may or may not be related (flight of ideas). °· Decreased ability to focus or concentrate. °· Increased purposeful activity, such as work, studies,  or social activity, or nonproductive activity, such as pacing, squirming and fidgeting, or finger and toe tapping. °· Impulsive behavior and use of poor judgment, resulting in high-risk activities, such as having unprotected sex or spending excessive amounts of money. °Signs and symptoms of depression include the following:  °· Feelings of sadness, hopelessness, or helplessness. °· Frequent or uncontrollable episodes of crying. °· Lack of feeling anything or caring about anything. °· Difficulty sleeping or sleeping too much.  °· Inability to enjoy the things you used to enjoy.   °· Desire to be alone all the time.   °· Feelings of guilt or worthlessness.  °· Lack of energy or motivation.   °· Difficulty concentrating, remembering, or making decisions.  °· Change in appetite or weight beyond normal fluctuations. °· Thoughts of death or the desire to harm yourself. °DIAGNOSIS  °Bipolar disorder is diagnosed through an assessment by your caregiver. Your caregiver will ask questions about your emotional episodes. There are two main types of bipolar disorder. People with type I bipolar disorder have manic episodes with or without depressive episodes. People with type II bipolar disorder have hypomanic episodes and major depressive episodes, which are more serious than mild depression. The type of bipolar disorder you have can make an important difference in how your illness is monitored and treated. °Your caregiver may ask questions about your medical history and use of alcohol or drugs, including prescription medication. Certain medical conditions and substances also can cause emotional highs and lows that resemble bipolar disorder (secondary bipolar disorder).  °TREATMENT  °Bipolar disorder is a long-term illness. It is best controlled with continuous treatment rather than treatment only when symptoms occur. The following treatments can be prescribed for bipolar disorders: °· Medication--Medication can be prescribed by  a doctor that   is an expert in treating mental disorders (psychiatrists). Medications called mood stabilizers are usually prescribed to help control the illness. Other medications are sometimes added if symptoms of mania, depression, or psychotic delusions and hallucinations occur despite the use of a mood stabilizer. °· Talk therapy--Some forms of talk therapy are helpful in providing support, education, and guidance. °A combination of medication and talk therapy is best for managing the disorder over time. A procedure in which electricity is applied to your brain through your scalp (electroconvulsive therapy) is used in cases of severe mania when medication and talk therapy do not work or work too slowly. °  °This information is not intended to replace advice given to you by your health care provider. Make sure you discuss any questions you have with your health care provider. °  °Document Released: 05/30/2000 Document Revised: 03/14/2014 Document Reviewed: 03/19/2012 °Elsevier Interactive Patient Education ©2016 [employer] ° °

## 2015-03-09 NOTE — Progress Notes (Addendum)
12:54p- CSW spoke with Jeannett SeniorJosephine Okeke, Director, [facility] [phone]. Director stated patient would be allowed to come back to the group home. Director stated she would like to have discharge papers on patient.   2:35p- CSW staffed with nurse. CSW spoke with Director regarding patient. Director stated a staff member would pick up patient around 3:30 or 4:00p. CSW informed Director patient's discharge summary and prescriptions would be provided upon pick up of patient. No other questions were noted for CSW at this time.   Jack [name], LCSWA [phone] ED CSW 03/09/2015 2:37 PM

## 2017-01-27 ENCOUNTER — Emergency Department (HOSPITAL_COMMUNITY)
Admission: EM | Admit: 2017-01-27 | Discharge: 2017-01-29 | Disposition: A | Discharge status: Other Acute Inpatient Hospital | Payer: Medicare Other | Attending: Emergency Medicine | Admitting: Emergency Medicine

## 2017-01-27 ENCOUNTER — Encounter (HOSPITAL_COMMUNITY): Payer: Self-pay

## 2017-01-27 DIAGNOSIS — F79 Unspecified intellectual disabilities: Secondary | ICD-10-CM | POA: Insufficient documentation

## 2017-01-27 DIAGNOSIS — Z91040 Latex allergy status: Secondary | ICD-10-CM | POA: Insufficient documentation

## 2017-01-27 DIAGNOSIS — G4700 Insomnia, unspecified: Secondary | ICD-10-CM | POA: Insufficient documentation

## 2017-01-27 DIAGNOSIS — R443 Hallucinations, unspecified: Secondary | ICD-10-CM

## 2017-01-27 DIAGNOSIS — F3112 Bipolar disorder, current episode manic without psychotic features, moderate: Secondary | ICD-10-CM | POA: Insufficient documentation

## 2017-01-27 DIAGNOSIS — Z046 Encounter for general psychiatric examination, requested by authority: Secondary | ICD-10-CM | POA: Insufficient documentation

## 2017-01-27 DIAGNOSIS — R440 Auditory hallucinations: Secondary | ICD-10-CM | POA: Insufficient documentation

## 2017-01-27 DIAGNOSIS — Z79899 Other long term (current) drug therapy: Secondary | ICD-10-CM | POA: Insufficient documentation

## 2017-01-27 LAB — COMPREHENSIVE METABOLIC PANEL
ALBUMIN: 3.9 g/dL (ref 3.5–5.0)
ALK PHOS: 56 U/L (ref 38–126)
ALT: 11 U/L — ABNORMAL LOW (ref 17–63)
AST: 16 U/L (ref 15–41)
Anion gap: 6 (ref 5–15)
BILIRUBIN TOTAL: 0.5 mg/dL (ref 0.3–1.2)
BUN: 12 mg/dL (ref 6–20)
CALCIUM: 9.3 mg/dL (ref 8.9–10.3)
CO2: 25 mmol/L (ref 22–32)
CREATININE: 0.83 mg/dL (ref 0.61–1.24)
Chloride: 106 mmol/L (ref 101–111)
GFR calc non Af Amer: >60 mL/min (ref >60)
Glucose, Bld: 96 mg/dL (ref 65–99)
Potassium: 3.9 mmol/L (ref 3.5–5.1)
SODIUM: 137 mmol/L (ref 135–145)
Total Protein: 6.8 g/dL (ref 6.5–8.1)

## 2017-01-27 LAB — CBC WITH DIFFERENTIAL/PLATELET
Basophils Absolute: 0 10*3/uL (ref 0.0–0.1)
Basophils Relative: 0 %
EOS ABS: 0.2 10*3/uL (ref 0.0–0.7)
EOS PCT: 2 %
HCT: 36.2 % — ABNORMAL LOW (ref 39.0–52.0)
Hemoglobin: 11.9 g/dL — ABNORMAL LOW (ref 13.0–17.0)
LYMPHS ABS: 3.9 10*3/uL (ref 0.7–4.0)
Lymphocytes Relative: 52 %
MCH: 30.2 pg (ref 26.0–34.0)
MCHC: 32.9 g/dL (ref 30.0–36.0)
MCV: 91.9 fL (ref 78.0–100.0)
MONOS PCT: 7 %
Monocytes Absolute: 0.5 10*3/uL (ref 0.1–1.0)
Neutro Abs: 3 10*3/uL (ref 1.7–7.7)
Neutrophils Relative %: 39 %
PLATELETS: 152 10*3/uL (ref 150–400)
RBC: 3.94 MIL/uL — AB (ref 4.22–5.81)
RDW: 13.3 % (ref 11.5–15.5)
WBC: 7.5 10*3/uL (ref 4.0–10.5)

## 2017-01-27 LAB — ETHANOL: Alcohol, Ethyl (B): <10 mg/dL (ref <10)

## 2017-01-27 NOTE — ED Triage Notes (Signed)
Pt presents with c/o hallucinations and head pain. Pt resides at a group home and is talking to his mom's spirit (mom is not deceased). Pt has a hx of schizophrenia and mental retardation. Pt denies any SI or HI. Pt also c/o head pain from "taking too many pills that they have been giving him". Cooperative per EMS.

## 2017-01-28 LAB — RAPID URINE DRUG SCREEN, HOSP PERFORMED
Amphetamines: NOT DETECTED
BENZODIAZEPINES: NOT DETECTED
Barbiturates: NOT DETECTED
Cocaine: NOT DETECTED
Opiates: NOT DETECTED
Tetrahydrocannabinol: NOT DETECTED

## 2017-01-28 LAB — LITHIUM LEVEL: Lithium Lvl: 0.93 mmol/L (ref 0.60–1.20)

## 2017-01-28 MED ORDER — LITHIUM CARBONATE ER 450 MG PO TBCR
900.0000 mg | EXTENDED_RELEASE_TABLET | Freq: Every day | ORAL | Status: DC
  Administered 2017-01-28 – 2017-01-29 (×2): 900 mg via ORAL
  Filled 2017-01-28 (×2): qty 2

## 2017-01-28 MED ORDER — ACETAMINOPHEN 325 MG PO TABS: 650.0000 mg | ORAL_TABLET | ORAL | Status: DC | PRN

## 2017-01-28 MED ORDER — TRAZODONE HCL 100 MG PO TABS
100.0000 mg | ORAL_TABLET | Freq: Every day | ORAL | Status: DC
  Administered 2017-01-29: 100 mg via ORAL
  Filled 2017-01-28: qty 1

## 2017-01-28 MED ORDER — DIVALPROEX SODIUM 500 MG PO DR TAB
1500.0000 mg | DELAYED_RELEASE_TABLET | Freq: Every day | ORAL | Status: DC
  Administered 2017-01-29: 1500 mg via ORAL
  Filled 2017-01-28: qty 3

## 2017-01-28 MED ORDER — DOCUSATE SODIUM 100 MG PO CAPS: 100.0000 mg | ORAL_CAPSULE | Freq: Every day | ORAL | Status: DC | PRN

## 2017-01-28 MED ORDER — DIVALPROEX SODIUM 500 MG PO DR TAB: 1500.0000 mg | DELAYED_RELEASE_TABLET | Freq: Every day | ORAL | Status: DC

## 2017-01-28 MED ORDER — MAGNESIUM OXIDE 400 (241.3 MG) MG PO TABS
400.0000 mg | ORAL_TABLET | Freq: Every day | ORAL | Status: DC
Start: 2017-01-28 — End: 2017-01-29
  Administered 2017-01-28 – 2017-01-29 (×2): 400 mg via ORAL
  Filled 2017-01-28 (×3): qty 1

## 2017-01-28 MED ORDER — CLOZAPINE 100 MG PO TABS
200.0000 mg | ORAL_TABLET | Freq: Every day | ORAL | Status: DC
  Administered 2017-01-29: 200 mg via ORAL
  Filled 2017-01-28 (×2): qty 2

## 2017-01-28 MED ORDER — IBUPROFEN 200 MG PO TABS: 400.0000 mg | ORAL_TABLET | Freq: Four times a day (QID) | ORAL | Status: DC | PRN

## 2017-01-28 NOTE — ED Notes (Signed)
Patient in room staring in mirror stating, "I have the [place] [employer].  I'm meditating right now."  Patient then sat on bed, pulled out his bible, began shaking, and closed his bible.  Patient now lying in bed carrying on conversation with someone he calls, "My Baby."

## 2017-01-28 NOTE — BH Assessment (Signed)
[facility] Assessment Progress Note  Case was staffed with Rene KocherEksir MD who spoke with guardian and patient. Patient will be observed and monitored with medication/s evaluated. Patient will be seen by psychiatry in the a.m.

## 2017-01-28 NOTE — ED Provider Notes (Addendum)
[facility]-EMERGENCY DEPT Provider Note   CSN: [id number] Arrival date & time: 01/27/17  2002     History   Chief Complaint Chief Complaint  Patient presents with  . Hallucinations    HPI Jack [name] is a 31 y.o. male.  HPI 31 year old male with a history of bipolar, intellectual disability, schizophrenia and seizures presents with concern for hallucinations from his group home.  Group home had reported significant insomnia, and patient being hyper religious and reporting that the spirits of his mother was speaking to him. (mom still alive)  Per nursing, mother had also reported that the group home said they would not accept him given his insomnia.  We were not provided any history of violence.  Patient denies suicidal ideation or homicidal ideation.  Reports that he hears a voice, a spirit that talks to him.  Reports that the spirit tells him to "be the best man I can be."  He continues to discuss religion and is somewhat tangential.  Denies other concerns.    Past Medical History:  Diagnosis Date  . Bipolar 1 disorder (HCC)   . Intellectual disability   . Mental retardation   . Schizophrenia, schizo-affective (HCC)   . Seizures [facility](HCC)     Patient Active Problem List   Diagnosis Date Noted  . Agitation 03/09/2015  . Bipolar 1 disorder, manic, moderate (HCC) 03/08/2015    History reviewed. No pertinent surgical history.     Home Medications    Prior to Admission medications   Medication Sig Start Date End Date Taking? Authorizing Provider  cloZAPine (CLOZARIL) 100 MG tablet Take 3 tablets (300 mg total) by mouth at bedtime. Patient taking differently: Take 200 mg by mouth at bedtime.  03/09/15  Yes Charm RingsLord, Jamison Y, NP  divalproex (DEPAKOTE) 500 MG DR tablet Take 1 tablet (500 mg total) by mouth every 12 (twelve) hours. Patient taking differently: Take 1,500 mg by mouth daily.  03/09/15  Yes Charm RingsLord, Jamison Y, NP  lithium carbonate (LITHOBID) 300 MG  CR tablet Take 3 tablets (900 mg) by mouth daily 11/13/16  Yes [provider]  magnesium oxide (MAG-OX) 400 MG tablet Take 400 mg by mouth daily.   Yes [provider]  cloZAPine (CLOZARIL) 100 MG tablet Take 1 tablet (100 mg total) by mouth daily. Patient not taking: Reported on 01/27/2017 03/09/15   Charm RingsLord, Jamison Y, NP  clozapine (CLOZARIL) 50 MG tablet TK 1 T PO NIGHTLY 11/14/16   [provider]  DOK 100 MG capsule TK 1 C PO BID AS NEEDED FOR CONSTIPATION 12/18/16   [provider]  haloperidol (HALDOL) 10 MG tablet TK 1 T PO BID 11/13/16   [provider]  lithium carbonate 300 MG capsule Take 3 capsules (900 mg total) by mouth at bedtime. Patient not taking: Reported on 01/27/2017 03/09/15   Charm RingsLord, Jamison Y, NP  LORazepam (ATIVAN) 1 MG tablet TK 1 T PO TID 12/10/16   [provider]  trihexyphenidyl (ARTANE) 2 MG tablet Take 1 tablet (2 mg total) by mouth 2 (two) times daily with a meal. Patient not taking: Reported on 01/27/2017 03/09/15   Charm RingsLord, Jamison Y, NP    Family History History reviewed. No pertinent family history.  Social History Social History   Tobacco Use  . Smoking status: Never Smoker  Substance Use Topics  . Alcohol use: No  . Drug use: No     Allergies   Iodinated diagnostic agents; Latex; and Shellfish-derived products  Review of Systems Review of Systems  Unable to perform ROS: Psychiatric disorder     Physical Exam Updated Vital Signs BP 101/62 (BP Location: Left Arm)   Pulse 80   Temp 97.8 F (36.6 C) (Oral)   Resp 18   SpO2 98%   Physical Exam  Constitutional: He is oriented to person, place, and time. He appears well-developed and well-nourished. No distress.  HENT:  Head: Normocephalic and atraumatic.  Eyes: Conjunctivae and EOM are normal.  Neck: Normal range of motion.  Cardiovascular: Normal rate, regular rhythm and intact distal pulses.  Pulmonary/Chest: Effort normal and breath sounds  normal. No respiratory distress.  Musculoskeletal: He exhibits no edema.  Neurological: He is alert and oriented to person, place, and time.  Skin: Skin is warm and dry. He is not diaphoretic.  Nursing note and vitals reviewed.    ED Treatments / Results  Labs (all labs ordered are listed, but only abnormal results are displayed) Labs Reviewed  COMPREHENSIVE METABOLIC PANEL - Abnormal; Notable for the following components:      Result Value   ALT 11 (*)    All other components within normal limits  CBC WITH DIFFERENTIAL/PLATELET - Abnormal; Notable for the following components:   RBC 3.94 (*)    Hemoglobin 11.9 (*)    HCT 36.2 (*)    All other components within normal limits  ETHANOL  RAPID URINE DRUG SCREEN, HOSP PERFORMED    EKG  EKG Interpretation None       Radiology No results found.  Procedures Procedures (including critical care time)  Medications Ordered in ED Medications - No data to display   Initial Impression / Assessment and Plan / ED Course  I have reviewed the triage vital signs and the nursing notes.  Pertinent labs & imaging results that were available during my care of the patient were reviewed by me and considered in my medical decision making (see chart for details).    31 year old male with a history of bipolar, intellectual disability, schizophrenia and seizures presents with concern for hallucinations from his group home.  Group home had reported significant insomnia, and patient being hyper religious and reporting that the spirits of his mother was speaking to him. (mom still alive)  Per nursing, mother had also reported that the group home said they would not accept him given his insomnia.    Patient is not suicidal or homicidal, however does appear to have psychosis on my evaluation, is hyper-religious and hearing voices of a spirit.  Given concern for decompensation, feel TTS and possible inpt admission may be discussed as he would likely  benefit from medication adjustments.  If outpatient therapy is recommended, will need to discuss with group home, possible social work.   Patient is medically cleared. Awaiting TTS.  Depakote and Lithium levels ordered for therapeutic monitoring.  No clinical symptoms to suggest toxicity, will order home dosing.   Final Clinical Impressions(s) / ED Diagnoses   Final diagnoses:  Hallucinations    ED Discharge Orders    None           Alvira MondaySchlossman, Inna Tisdell, MD 01/28/17 0104

## 2017-01-28 NOTE — ED Notes (Signed)
Patient is paranoid and suspicious of staff.

## 2017-01-28 NOTE — Progress Notes (Addendum)
Patient seen at bedside.  Denies any SI, HI, AVH.  Depakote restarted at 1500 mg - will switch to QHS dosing for assistance in sleep.  Spoke with mom regarding recent med changes.    He has been OFF lorazepam for 2-3 months. No concerns for acute withdrawal.  Most recently, med changes have been the decrease in Depakote to 1500 mg (due to him losing weight and dose adjustment required) and addition of Seroquel for sleep.  Mom consents to trazodone nightly to be started. Reviewed risks and benefits.  Will continue other medications as they are.  Depakote, based on his weight, is appropriately dose 20 mg/kg.  Will continue current dose, along with clozapine and lithium.

## 2017-01-28 NOTE — ED Notes (Signed)
Patient refusing night medication after loud disruption from another patient on the unit.  Patient stating, "I know what's going on here.  I'm not taking these medications and I mean it.  God is telling me not to take these medications and I'm not taking them.  I hear what y'all are doing out there and it's not right."

## 2017-01-28 NOTE — ED Notes (Signed)
Pt's mother, Crist FatChristy McQueen-Harbison, called at this time to discuss pt condition. Mother states that pt has been suffering from insomnia for 2 months with minimal relief and has been wondering at night per the staff of the group home where he resides. Mother states that group home will release pt if this behavior continues. Mother reports that pt's medications [facility] and he has recently changed lorazepam from 3 times a day to 2 times a day, as well as changed his Depakote and Seroquel prescriptions. Mother feels like pt may need new MD. Also it is reported that [facility]. Mother advised at this time of current POC and policy for the TCU. She verbalizes understanding at this time.

## 2017-01-28 NOTE — BH Assessment (Signed)
BH ASSESSMENT  LPCA attempted to complete BH ASSESSMENT.  Pt's speech was slurred and pt stated "you asking all of these questions make me feel pressure on my head like I'm going to have seizure."  LPCA asked pt if he would like to complete the assessment at a later time?  Pt responded "yes please."  TTS will complete assessment at a later time.  Quintus Premo L. Colen Eltzroth, MS, LPCA, [facility] Therapeutic Triage Specialist  [phone]

## 2017-01-28 NOTE — ED Notes (Signed)
Bed: WA27 Expected date:  Expected time:  Means of arrival:  Comments: 

## 2017-01-28 NOTE — BH Assessment (Signed)
Assessment Note  Jack [name] is an 31 y.o. male that presents this date from his group home [facility] after suffering from ongoing insomnia and possible AVH. Patient has a history of Bipolar, Schizophrenia, Seizures and per record review, IDD. Patient speaks with slow slurred speech and is difficult to understand. Patient is religiously fixed and is difficult to redirect. This Clinical research associate observed that patient may be experiencing some AVH as evidenced by patient pointing at the wall and stating "the lord is there, he is everywhere I can hear him singing." Limited history can be obtained from patient due to altered mental status. Patient is oriented to place only but did confirm that his mother Jack [name] [phone] is his legal guardian. This Clinical research associate contacted mother who rendered collateral information to assist with assessment information. Guardian states patient has had recent medication changes from [facility] who assists with medication management. Guardian stated patient has been experiencing insomnia and increased AVH since those changes were made two weeks ago by provider. Per note review patient was last admitted to [facility] in 2016 for increased agitation at his group home. Patient denies any S/I or H/I this date. Per note review, patient has a history of bipolar, intellectual disability, schizophrenia and seizures presenting with concern for hallucinations from his group home. Group home had reported significant insomnia, and patient being hyper religious and reporting that the spirits of his mother was speaking to him. (mom still alive). Per nursing, mother had also reported that the group home said they would not accept him given his insomnia. We were not provided any history of violence. Patient denies suicidal ideation or homicidal ideation. Reports that he hears a voice, a spirit that talks to him. Reports that the spirit tells him to "be the best man I can be." He continues to discuss religion and  is somewhat tangential. Denies other concerns. Case was staffed with Rene Kocher MD who spoke with guardian and patient. Patient will be observed and monitored with medication/s evaluated. Patient will be seen by psychiatry in the a.m.      Diagnosis: F25.0 Schizoaffective disorder, bipolar type  Past Medical History:  Past Medical History:  Diagnosis Date  . Bipolar 1 disorder (HCC)   . Intellectual disability   . Mental retardation   . Schizophrenia, schizo-affective (HCC)   . Seizures (HCC)     History reviewed. No pertinent surgical history.  Family History: History reviewed. No pertinent family history.  Social History:  reports that  has never smoked. He does not have any smokeless tobacco history on file. He reports that he does not drink alcohol or use drugs.  Additional Social History:  Alcohol / Drug Use Pain Medications: See PTA Prescriptions: See PTA Over the Counter: See PTA History of alcohol / drug use?: No history of alcohol / drug abuse Longest period of sobriety (when/how long): NA Negative Consequences of Use: (NA) Withdrawal Symptoms: (NA)  CIWA: CIWA-Ar BP: 116/66 Pulse Rate: 77 COWS:    Allergies:  Allergies  Allergen Reactions  . Iodinated Diagnostic Agents     Reaction unknown--per [facility].    . Latex     Reaction unknown--per [facility].    [employer]     Reaction unknown--per [facility]      Home Medications:  (Not in a hospital admission)  OB/GYN Status:  No LMP for male patient.  General Assessment Data Assessment unable to be completed: Yes Reason for not completing assessment: Pt complained of "  feeling pressure when I talk to you like I'm going to have a seizure' Location of Assessment: [facility] ED TTS Assessment: In system Is this a Tele or Face-to-Face Assessment?: Face-to-Face Is this an Initial Assessment or a Re-assessment for this encounter?: Initial Assessment Marital status: Single Maiden name:  NA Is patient pregnant?: No Pregnancy Status: No Living Arrangements: Group Home Can pt return to current living arrangement?: Yes Admission Status: Voluntary Is patient capable of signing voluntary admission?: No Referral Source: Self/Family/Friend Insurance type: [facility]  Medical Screening Exam [facility] Walk[facility] ONLY) Medical Exam completed: Yes  Crisis Care Plan Living Arrangements: Group Home Legal Guardian: Mother Name of Psychiatrist: Group Home Name of Therapist: Group Home  Education Status Is patient currently in school?: No Current Grade: NA Highest grade of school patient has completed: (10) Name of school: NA Contact person: (NA)  Risk to self with the past 6 months Suicidal Ideation: No Has patient been a risk to self within the past 6 months prior to admission? : No Suicidal Intent: No Has patient had any suicidal intent within the past 6 months prior to admission? : No Is patient at risk for suicide?: No Suicidal Plan?: No Has patient had any suicidal plan within the past 6 months prior to admission? : No Access to Means: No What has been your use of drugs/alcohol within the last 12 months?: Denies Previous Attempts/Gestures: No How many times?: 0 Other Self Harm Risks: NA Triggers for Past Attempts: Unknown Intentional Self Injurious Behavior: None Family Suicide History: No Recent stressful life event(s): Other (Comment)(Med changes) Persecutory voices/beliefs?: No Depression: No Depression Symptoms: (NA) Substance abuse history and/or treatment for substance abuse?: No Suicide prevention information given to non-admitted patients: Not applicable  Risk to Others within the past 6 months Homicidal Ideation: No Does patient have any lifetime risk of violence toward others beyond the six months prior to admission? : No Thoughts of Harm to Others: No Current Homicidal Intent: No Current Homicidal Plan: No Access to Homicidal Means: No Identified  Victim: NA History of harm to others?: No Assessment of Violence: None Noted Violent Behavior Description: NA Does patient have access to weapons?: No Criminal Charges Pending?: No Does patient have a court date: No Is patient on probation?: No  Psychosis Hallucinations: Visual, Auditory Delusions: None noted  Mental Status Report Appearance/Hygiene: In scrubs Eye Contact: Fair Motor Activity: Freedom of movement Speech: Slurred, Slow Level of Consciousness: Drowsy Mood: Pleasant Affect: Appropriate to circumstance Anxiety Level: Minimal Thought Processes: Circumstantial Judgement: Unimpaired Orientation: Place Obsessive Compulsive Thoughts/Behaviors: None  Cognitive Functioning Concentration: Decreased Memory: Recent Intact IQ: (Pt is MR ) Insight: Poor Impulse Control: Poor Appetite: Fair Weight Loss: 0 Weight Gain: 0 Sleep: Decreased Total Hours of Sleep: (Less than 2 per mother) Vegetative Symptoms: None  ADLScreening [facility] Assessment Services) Patient's cognitive ability adequate to safely complete daily activities?: Yes Patient able to express need for assistance with ADLs?: Yes Independently performs ADLs?: Yes (appropriate for developmental age)  Prior Inpatient Therapy Prior Inpatient Therapy: Yes Prior Therapy Dates: 2016 [facility] Reason for Treatment: MH issues  Prior Outpatient Therapy Prior Outpatient Therapy: Yes Prior Therapy Dates: Ongoing Prior Therapy Facilty/Provider(s): [facility] Reason for Treatment: MH issues Does patient have an ACCT team?: No Does patient have Intensive In-House Services?  : No Does patient have [facility] services? : Yes Does patient have P4CC services?: No  ADL Screening (condition at time of admission) Patient's cognitive ability adequate to safely complete daily activities?:  Yes Is the patient deaf or have difficulty hearing?: No Does the patient have difficulty seeing, even when  wearing glasses/contacts?: No Does the patient have difficulty concentrating, remembering, or making decisions?: Yes Patient able to express need for assistance with ADLs?: Yes Does the patient have difficulty dressing or bathing?: No Independently performs ADLs?: Yes (appropriate for developmental age) Does the patient have difficulty walking or climbing stairs?: No Weakness of Legs: None Weakness of Arms/Hands: None  Home Assistive Devices/Equipment Home Assistive Devices/Equipment: None  Therapy Consults (therapy consults require a physician order) PT Evaluation Needed: No OT Evalulation Needed: No SLP Evaluation Needed: No Abuse/Neglect Assessment (Assessment to be complete while patient is alone) Physical Abuse: Denies Verbal Abuse: Denies Sexual Abuse: Denies Exploitation of patient/patient's resources: Denies Self-Neglect: Denies Values / Beliefs Cultural Requests During Hospitalization: None Spiritual Requests During Hospitalization: None Consults Spiritual Care Consult Needed: No Social Work Consult Needed: No Merchant navy officerAdvance Directives (For Healthcare) Does Patient Have a Medical Advance Directive?: No Would patient like information on creating a medical advance directive?: No - Patient declined    Additional Information 1:1 In Past 12 Months?: No CIRT Risk: No Elopement Risk: No Does patient have medical clearance?: Yes     Disposition:  Case was staffed with Rene KocherEksir MD who spoke with guardian and patient. Patient will be observed and monitored with medication/s evaluated. Patient will be seen by psychiatry in the a.m.  Disposition Initial Assessment Completed for this Encounter: Yes Disposition of Patient: Other dispositions(Monitor and observe evaluated for med interventions) Other disposition(s): Other (Comment)(Observe and monitor med eval)  On Site Evaluation by:   Reviewed with Physician:    Alfredia Fergusonavid L Kaylise Blakeley 01/28/2017 9:55 AM

## 2017-01-29 ENCOUNTER — Encounter (HOSPITAL_COMMUNITY): Payer: Self-pay | Admitting: *Deleted

## 2017-01-29 ENCOUNTER — Inpatient Hospital Stay (HOSPITAL_COMMUNITY)
Admission: AD | Admit: 2017-01-29 | Discharge: 2017-02-20 | DRG: 885 | Disposition: A | Discharge status: Home / Self Care | Payer: Medicare Other | Attending: Psychiatry | Admitting: Psychiatry

## 2017-01-29 ENCOUNTER — Other Ambulatory Visit: Payer: Self-pay

## 2017-01-29 DIAGNOSIS — G40409 Other generalized epilepsy and epileptic syndromes, not intractable, without status epilepticus: Secondary | ICD-10-CM | POA: Diagnosis present

## 2017-01-29 DIAGNOSIS — R443 Hallucinations, unspecified: Secondary | ICD-10-CM | POA: Diagnosis not present

## 2017-01-29 DIAGNOSIS — F312 Bipolar disorder, current episode manic severe with psychotic features: Secondary | ICD-10-CM | POA: Diagnosis present

## 2017-01-29 DIAGNOSIS — Z91040 Latex allergy status: Secondary | ICD-10-CM

## 2017-01-29 DIAGNOSIS — F419 Anxiety disorder, unspecified: Secondary | ICD-10-CM | POA: Diagnosis present

## 2017-01-29 DIAGNOSIS — R440 Auditory hallucinations: Secondary | ICD-10-CM | POA: Diagnosis not present

## 2017-01-29 DIAGNOSIS — F3112 Bipolar disorder, current episode manic without psychotic features, moderate: Secondary | ICD-10-CM

## 2017-01-29 DIAGNOSIS — R451 Restlessness and agitation: Secondary | ICD-10-CM | POA: Diagnosis not present

## 2017-01-29 DIAGNOSIS — F79 Unspecified intellectual disabilities: Secondary | ICD-10-CM | POA: Diagnosis present

## 2017-01-29 DIAGNOSIS — Z91041 Radiographic dye allergy status: Secondary | ICD-10-CM

## 2017-01-29 DIAGNOSIS — Z79899 Other long term (current) drug therapy: Secondary | ICD-10-CM

## 2017-01-29 DIAGNOSIS — G4700 Insomnia, unspecified: Secondary | ICD-10-CM | POA: Diagnosis present

## 2017-01-29 DIAGNOSIS — F250 Schizoaffective disorder, bipolar type: Secondary | ICD-10-CM | POA: Diagnosis present

## 2017-01-29 DIAGNOSIS — Z91013 Allergy to seafood: Secondary | ICD-10-CM | POA: Diagnosis not present

## 2017-01-29 DIAGNOSIS — F3113 Bipolar disorder, current episode manic without psychotic features, severe: Secondary | ICD-10-CM | POA: Insufficient documentation

## 2017-01-29 MED ORDER — LITHIUM CARBONATE ER 450 MG PO TBCR
900.0000 mg | EXTENDED_RELEASE_TABLET | Freq: Every day | ORAL | Status: DC
  Administered 2017-01-30 – 2017-02-14 (×14): 900 mg via ORAL
  Filled 2017-01-29 (×19): qty 2

## 2017-01-29 MED ORDER — MAGNESIUM OXIDE 400 (241.3 MG) MG PO TABS
400.0000 mg | ORAL_TABLET | Freq: Every day | ORAL | Status: DC
  Administered 2017-01-30 – 2017-02-20 (×21): 400 mg via ORAL
  Filled 2017-01-29 (×25): qty 1

## 2017-01-29 MED ORDER — ACETAMINOPHEN 325 MG PO TABS
650.0000 mg | ORAL_TABLET | ORAL | Status: DC | PRN
  Administered 2017-02-12 (×2): 650 mg via ORAL
  Filled 2017-01-29 (×2): qty 2

## 2017-01-29 MED ORDER — CLONAZEPAM 0.5 MG PO TABS: 0.5000 mg | ORAL_TABLET | Freq: Every day | ORAL | Status: DC

## 2017-01-29 MED ORDER — MAGNESIUM HYDROXIDE 400 MG/5ML PO SUSP
30.0000 mL | Freq: Every day | ORAL | Status: DC | PRN
  Administered 2017-02-19: 30 mL via ORAL
  Filled 2017-01-29: qty 30

## 2017-01-29 MED ORDER — DOCUSATE SODIUM 100 MG PO CAPS: 100.0000 mg | ORAL_CAPSULE | Freq: Every day | ORAL | Status: DC | PRN

## 2017-01-29 MED ORDER — CLOZAPINE 25 MG PO TABS
50.0000 mg | ORAL_TABLET | Freq: Every day | ORAL | Status: DC
  Administered 2017-01-30 – 2017-02-07 (×9): 50 mg via ORAL
  Filled 2017-01-29 (×11): qty 2

## 2017-01-29 MED ORDER — ALUM & MAG HYDROXIDE-SIMETH 200-200-20 MG/5ML PO SUSP
30.0000 mL | ORAL | Status: DC | PRN
  Administered 2017-02-19: 30 mL via ORAL
  Filled 2017-01-29: qty 30

## 2017-01-29 MED ORDER — DIVALPROEX SODIUM 500 MG PO DR TAB
1500.0000 mg | DELAYED_RELEASE_TABLET | Freq: Every day | ORAL | Status: DC
  Filled 2017-01-29 (×2): qty 3

## 2017-01-29 MED ORDER — CLOZAPINE 100 MG PO TABS
200.0000 mg | ORAL_TABLET | Freq: Every day | ORAL | Status: DC
  Administered 2017-01-30 – 2017-02-01 (×3): 200 mg via ORAL
  Filled 2017-01-29 (×6): qty 2

## 2017-01-29 MED ORDER — CLOZAPINE 25 MG PO TABS
50.0000 mg | ORAL_TABLET | Freq: Every day | ORAL | Status: DC
  Administered 2017-01-29: 50 mg via ORAL
  Filled 2017-01-29: qty 2

## 2017-01-29 NOTE — Tx Team (Signed)
Initial Treatment Plan 01/29/2017 10:13 PM Jack HoseGary T Elzey [id number]    PATIENT STRESSORS: Health problems   PATIENT STRENGTHS: Religious Affiliation   PATIENT IDENTIFIED PROBLEMS:                      DISCHARGE CRITERIA:  Improved stabilization in mood, thinking, and/or behavior Verbal commitment to aftercare and medication compliance  PRELIMINARY DISCHARGE PLAN: Outpatient therapy Placement in alternative living arrangements  PATIENT/FAMILY INVOLVEMENT: This treatment plan has been presented to and reviewed with the patient, Jack [name], and/or family member.  The patient has been given the opportunity to ask questions and make suggestions.  [name], [name], RN 01/29/2017, 10:13 PM

## 2017-01-29 NOTE — BH Assessment (Addendum)
Per Miki KinsLinsey, AC, [facility] to [id number]. Call report to [phone]. TTS left message with guardian, awaiting call back. Per Nanine MeansJamison Lord, DNP, pt can sign himself in. [facility].

## 2017-01-29 NOTE — Consult Note (Signed)
[facility] Psych ED Progress Note  01/29/2017 11:30 AM Jack [name]  MRN:  [id number]   Subjective:  Jack [name] did not sleep last night, very hyper-religious. Speaks in tongue this morning with writer, and makes a deep spiritual voice. Calls himself the preacher.    Discussed with patient's mom. Reviewed risks/benefits of increase clozapine and reviewed risk of neutropenia, myocarditis, confusion, constipation, weight.  Mom was agreeable to appreciative.   Principal Problem: <principal problem not specified> Diagnosis:   Patient Active Problem List   Diagnosis Date Noted  . Agitation [R45.1] 03/09/2015  . Bipolar 1 disorder, manic, moderate (HCC) [F31.12] 03/08/2015   Total Time spent with patient: 20 minutes  Past Psychiatric History: See intake H&P for full details. Reviewed, with no updates at this time.   Past Medical History:  Past Medical History:  Diagnosis Date  . Bipolar 1 disorder ([place])   . Intellectual disability   . Mental retardation   . Schizophrenia, schizo-affective ([place])   . Seizures ([place])    History reviewed. No pertinent surgical history. Family History: History reviewed. No pertinent family history. Family Psychiatric  History: See intake H&P for full details. Reviewed, with no updates at this time.  Social History:  Social History   Substance and Sexual Activity  Alcohol Use No     Social History   Substance and Sexual Activity  Drug Use No    Social History   Socioeconomic History  . Marital status: Single    Spouse name: None  . Number of children: None  . Years of education: None  . Highest education level: None  Social Needs  . Financial resource strain: None  . Food insecurity - worry: None  . Food insecurity - inability: None  . Transportation needs - medical: None  . Transportation needs - non-medical: None  Occupational History  . None  Tobacco Use  . Smoking status: Never Smoker  Substance and Sexual Activity  . Alcohol use:  No  . Drug use: No  . Sexual activity: None  Other Topics Concern  . None  Social History Narrative  . None    Sleep: Poor  Appetite:  Fair  Current Medications: Current Facility-Administered Medications  Medication Dose Route Frequency Provider Last Rate Last Dose  . acetaminophen (TYLENOL) tablet 650 mg  650 mg Oral Q4H PRN Gareth Morgan, MD      . clonazePAM Bobbye [place]) tablet 0.5 mg  0.5 mg Oral QHS Eksir, Richard Miu, MD      . cloZAPine (CLOZARIL) tablet 200 mg  200 mg Oral QHS Gareth Morgan, MD   200 mg at 01/29/17 0019  . cloZAPine (CLOZARIL) tablet 50 mg  50 mg Oral Daily Aundra Dubin, MD      . divalproex (DEPAKOTE) DR tablet 1,500 mg  1,500 mg Oral QHS Aundra Dubin, MD   1,500 mg at 01/29/17 0020  . docusate sodium (COLACE) capsule 100 mg  100 mg Oral Daily PRN Gareth Morgan, MD      . lithium carbonate (ESKALITH) CR tablet 900 mg  900 mg Oral QHS Gareth Morgan, MD   900 mg at 01/29/17 0019  . magnesium oxide (MAG-OX) tablet 400 mg  400 mg Oral Daily Gareth Morgan, MD   400 mg at 01/29/17 2831   Current Outpatient Medications  Medication Sig Dispense Refill  . cloZAPine (CLOZARIL) 100 MG tablet Take 3 tablets (300 mg total) by mouth at bedtime. (Patient taking differently: Take 200 mg by mouth at  bedtime. ) 90 tablet 0  . divalproex (DEPAKOTE) 500 MG DR tablet Take 1 tablet (500 mg total) by mouth every 12 (twelve) hours. (Patient taking differently: Take 1,500 mg by mouth daily. ) 60 tablet 0  . lithium carbonate (LITHOBID) 300 MG CR tablet Take 3 tablets (900 mg) by mouth daily  1  . magnesium oxide (MAG-OX) 400 MG tablet Take 400 mg by mouth daily.    . cloZAPine (CLOZARIL) 100 MG tablet Take 1 tablet (100 mg total) by mouth daily. (Patient not taking: Reported on 01/27/2017) 30 tablet 0  . clozapine (CLOZARIL) 50 MG tablet TK 1 T PO NIGHTLY  1  . DOK 100 MG capsule TK 1 C PO BID AS NEEDED FOR CONSTIPATION  1  . haloperidol (HALDOL)  10 MG tablet TK 1 T PO BID  1  . lithium carbonate 300 MG capsule Take 3 capsules (900 mg total) by mouth at bedtime. (Patient not taking: Reported on 01/27/2017) 90 capsule 0  . LORazepam (ATIVAN) 1 MG tablet TK 1 T PO TID  1  . trihexyphenidyl (ARTANE) 2 MG tablet Take 1 tablet (2 mg total) by mouth 2 (two) times daily with a meal. (Patient not taking: Reported on 01/27/2017) 60 tablet 0    Lab Results:  Results for orders placed or performed during the hospital encounter of 01/27/17 (from the past 48 hour(s))  Comprehensive metabolic panel     Status: Abnormal   Collection Time: 01/27/17  8:33 PM  Result Value Ref Range   Sodium 137 135 - 145 mmol/L   Potassium 3.9 3.5 - 5.1 mmol/L   Chloride 106 101 - 111 mmol/L   CO2 25 22 - 32 mmol/L   Glucose, Bld 96 65 - 99 mg/dL   BUN 12 6 - 20 mg/dL   Creatinine, Ser 0.83 0.61 - 1.24 mg/dL   Calcium 9.3 8.9 - 10.3 mg/dL   Total Protein 6.8 6.5 - 8.1 g/dL   Albumin 3.9 3.5 - 5.0 g/dL   AST 16 15 - 41 U/L   ALT 11 (L) 17 - 63 U/L   Alkaline Phosphatase 56 38 - 126 U/L   Total Bilirubin 0.5 0.3 - 1.2 mg/dL   GFR calc non Af Amer >60 >60 mL/min   GFR calc Af Amer >60 >60 mL/min    Comment: (NOTE) The eGFR has been calculated using the CKD EPI equation. This calculation has not been validated in all clinical situations. eGFR's persistently <60 mL/min signify possible Chronic Kidney Disease.    Anion gap 6 5 - 15  Ethanol     Status: None   Collection Time: 01/27/17  8:33 PM  Result Value Ref Range   Alcohol, Ethyl (B) <10 <10 mg/dL    Comment:        LOWEST DETECTABLE LIMIT FOR SERUM ALCOHOL IS 10 mg/dL FOR MEDICAL PURPOSES ONLY   CBC with Diff     Status: Abnormal   Collection Time: 01/27/17  8:33 PM  Result Value Ref Range   WBC 7.5 4.0 - 10.5 K/uL   RBC 3.94 (L) 4.22 - 5.81 MIL/uL   Hemoglobin 11.9 (L) 13.0 - 17.0 g/dL   HCT 36.2 (L) 39.0 - 52.0 %   MCV 91.9 78.0 - 100.0 fL   MCH 30.2 26.0 - 34.0 pg   MCHC 32.9 30.0 - 36.0  g/dL   RDW 13.3 11.5 - 15.5 %   Platelets 152 150 - 400 K/uL   Neutrophils Relative % 39 %  Neutro Abs 3.0 1.7 - 7.7 K/uL   Lymphocytes Relative 52 %   Lymphs Abs 3.9 0.7 - 4.0 K/uL   Monocytes Relative 7 %   Monocytes Absolute 0.5 0.1 - 1.0 K/uL   Eosinophils Relative 2 %   Eosinophils Absolute 0.2 0.0 - 0.7 K/uL   Basophils Relative 0 %   Basophils Absolute 0.0 0.0 - 0.1 K/uL  Lithium level     Status: None   Collection Time: 01/28/17  1:04 AM  Result Value Ref Range   Lithium Lvl 0.93 0.60 - 1.20 mmol/L    Blood Alcohol level:  Lab Results  Component Value Date   ETH <10 01/27/2017   ETH <5 03/07/2015    Physical Findings: AIMS:  , ,  ,  ,    CIWA:    COWS:     Musculoskeletal: Strength & Muscle Tone: within normal limits Gait & Station: normal Patient leans: N/A  Psychiatric Specialty Exam: Physical Exam  ROS  Blood pressure 113/62, pulse 80, temperature 98.6 F (37 C), temperature source Oral, resp. rate 16, SpO2 95 %.There is no height or weight on file to calculate BMI.  General Appearance: Bizarre and Fairly Groomed  Eye Contact:  Good  Speech:  loud, religious tongues  Volume:  Increased  Mood:  Euphoric  Affect:  Congruent  Thought Process:  Disorganized  Orientation:  NA  Thought Content:  Illogical and hyperreligious  Suicidal Thoughts:  No  Homicidal Thoughts:  No  Memory:  Immediate;   Poor  Judgement:  Impaired  Insight:  Lacking  Psychomotor Activity:  Restlessness  Concentration:  Concentration: Poor  Recall:  Poor  Fund of Knowledge:  Poor  Language:  Fair  Akathisia:  Negative  Handed:  Right  AIMS (if indicated):     Assets:  Desire for Improvement Social Support  ADL's:  Intact  Cognition:  Impaired,  Moderate  Sleep:   poor, no sleep    Treatment Plan Summary: Jack [name] is a 31 year old male with IDD and schizophrenia presenting with worsening psychosis.  I do not suspect his behaviors at present are 2/2 to IDD, I am  concerned about worsening symptoms of thought disorder.  Ongoing medication management as below and will make referral for psychiatric hospitalization.  He has been cooperative without aggression in hospital, remains pleasantly delusional, hyper-religious, not sleeping.   Status of current problems: unchanged  Collateral Obtained/Records Reviewed: discussed with mom as above  Plan:  D/c trazodone given lack of effect clonazepam 0.5 mg qhs sleep/mania Increase clozapine to 50 mg QAM and 200 mg QHS Titrate clozapine to goal dose of 200 mg BID Continue Lithium 900 qhs (level is 0.93) Continue Depakote 1500 mg qhs; may consider tapering and discontinuing as clozapine is titrated referral for psychiatric hospitalization; mom is guardian  CBC with diff and EKG tomorrow AM  Aundra Dubin, MD 01/29/2017, 11:30 AM

## 2017-01-29 NOTE — ED Notes (Signed)
Patient in bathroom stating that he is "shouting" because he is "in the spirit."  He then told staff that the pastor gave him a church.

## 2017-01-29 NOTE — BH Assessment (Signed)
[facility] Assessment Progress Note  Per Linsey, AC, pt accepted to 500 hall bed available on 01/30/17. AC to coordinate transfer tomorrow.

## 2017-01-29 NOTE — ED Notes (Signed)
Report to include situation, background, assessment and recommendations from Barbara RN. Patient sleeping, respirations regular and unlabored. Q15 minute rounds and security camera observation to continue.   

## 2017-01-29 NOTE — Progress Notes (Signed)
Patient ID: Jack [name], male   DOB: [birth year], 31 y.o.   MRN: [id number]  [facility].  Patient lives at a group home and was taken to ED due to having auditory hallucinations and having pychosis.  Patient's mother [name] is very supportive per patient.  Patient is hyper religious and sts he hears "God and hes turning into a pastor."  Patient is responding to internal stimuli.  Patient sts he doesn't sleep much b/c it "strengthens his faith."  [facility].  Patient sts he is being admitted to this hospital due to having a seizure.  Patient sts he thinks the "lady" at his group home is "trying to kill me, b/c she gives me too many pills."  Patient is cooperative but is still hyper religious and responding to internal stimuli.

## 2017-01-29 NOTE — ED Notes (Signed)
Hourly rounding reveals patient in room. No complaints, stable, in no acute distress. Q15 minute rounds and monitoring via [employer] Cam[employer] to continue. Pt. Eating dinner tray.

## 2017-01-29 NOTE — ED Notes (Signed)
Patient alert and verbal. Patient states that his group home is trying to harm him with medications. 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 Patient then spoke in tongues. Patient appears to be responding to internal stimuli when he stops to pause. Asked if he here voices he stated the spirit. Patient is calm and cooperative. Q 15 minute checks in progress and patient remains safe on unit. Patient given a spritt to drink. Patient is currently lying in bed watching television. Monitoring continues.

## 2017-01-29 NOTE — Progress Notes (Signed)
Dr. Rene KocherEksir would like to have the Clozaril titrated by 50 mg daily to achieve a 200 mg BID goal.   Nanine MeansJamison Lord, PMHNP

## 2017-01-29 NOTE — ED Notes (Signed)
Patient experiencing A/V Hallucinations stating, "Hey do you want to talk to my mom?  She's here."  Patient then observed by staff sitting on side of bed engaging in conversation.

## 2017-01-29 NOTE — ED Notes (Signed)
Patient has agreed to take HS medications.

## 2017-01-29 NOTE — ED Notes (Signed)
Patient has been up all night experiencing AV hallucinations.

## 2017-01-30 DIAGNOSIS — F250 Schizoaffective disorder, bipolar type: Principal | ICD-10-CM

## 2017-01-30 DIAGNOSIS — G4700 Insomnia, unspecified: Secondary | ICD-10-CM

## 2017-01-30 DIAGNOSIS — F419 Anxiety disorder, unspecified: Secondary | ICD-10-CM

## 2017-01-30 DIAGNOSIS — R443 Hallucinations, unspecified: Secondary | ICD-10-CM

## 2017-01-30 DIAGNOSIS — F79 Unspecified intellectual disabilities: Secondary | ICD-10-CM

## 2017-01-30 LAB — VALPROIC ACID LEVEL: VALPROIC ACID LVL: 45 ug/mL — AB (ref 50.0–100.0)

## 2017-01-30 MED ORDER — OLANZAPINE 10 MG PO TABS
10.0000 mg | ORAL_TABLET | Freq: Three times a day (TID) | ORAL | Status: DC | PRN
  Administered 2017-02-02 – 2017-02-12 (×3): 10 mg via ORAL
  Filled 2017-01-30 (×5): qty 1

## 2017-01-30 MED ORDER — HYDROXYZINE HCL 50 MG PO TABS
50.0000 mg | ORAL_TABLET | Freq: Four times a day (QID) | ORAL | Status: DC | PRN
  Administered 2017-01-31 – 2017-02-18 (×11): 50 mg via ORAL
  Filled 2017-01-30 (×14): qty 1

## 2017-01-30 MED ORDER — DIVALPROEX SODIUM 500 MG PO DR TAB
2000.0000 mg | DELAYED_RELEASE_TABLET | Freq: Every day | ORAL | Status: DC
  Administered 2017-01-30 – 2017-02-14 (×14): 2000 mg via ORAL
  Filled 2017-01-30 (×18): qty 4

## 2017-01-30 NOTE — Progress Notes (Signed)
Due to patient being asleep I did not wake him to give him his pm medications.  Charge nurse notified.

## 2017-01-30 NOTE — [facility] Suicide Risk Assessment (Signed)
[facility]   Nursing information obtained from:  Patient Demographic factors:  Male, Adolescent or young adult, Low socioeconomic status, Unemployed Current Mental Status:  NA Loss Factors:  NA Historical Factors:  Impulsivity Risk Reduction Factors:  Living with another person, especially a relative, Religious beliefs about death, Sense of responsibility to family  Total Time spent with patient: 45 minutes Principal Problem: Schizoaffective disorder, bipolar type (HCC) Diagnosis:   Patient Active Problem List   Diagnosis Date Noted  . Bipolar affective disorder, manic, severe (HCC) [F31.13] 01/29/2017  . Agitation [R45.1] 03/09/2015  . Bipolar 1 disorder, manic, moderate (HCC) [F31.12] 03/08/2015  . Schizoaffective disorder, bipolar type (HCC) [F25.0] 01/10/2012  . Intellectual disability [F79] 01/10/2012   Subjective Data:  Jack [name] is a 31 y/o M with history of Schizoaffective bipolar type and intellectual disability who was admitted to [facility] from his group home on 01/28/17 with worsening symptoms of mania, pressured speech, religious preoccupation, delusions, and hallucinations. 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 He also stated that he saw visual hallucinations and he is able to speak with the spirit of his mother despite that she is still living. Pt resides in a group home and he has been disturbing his peers due to insomnia and staying up all night with hyperactivity of preaching.  Today upon evaluation, pt is pressured, labile, and religiously preoccupied. He endorses AH of hearing the voice of his mother's spirit and he has conversations with the voice during the interview. Pt also endorses VH of seeing "yellow spirits." He denies SI/HI. Pt's mood appears to be euphoric, and he endorses multiple grandiose delusions that he has been chosen by God to be a "reverend." Pt also  states that he is expecting to receive a gift of a church by God in the coming days. Pt reports he has not been sleeping well due to having so much energy from God. He denies other symptoms of mania, OCD, depression, and PTSD. He denies illicit substance use.  Collateral information was provided by pt's mother, Eula ListenChristie McQueen. She reports that patient's symptoms have been worsening for a period of about 6 months. While at baseline he has AH, he previously has not provided a voice to the [facility] when re-enacting them and apparently having conversations by himself. She notes that he has caused significant disturbance at his group home due to his behaviors which are difficult to redirect. She notes that his outpatient provider changed him from lorazepam (which was working and helpful) to clozapine about 2 weeks ago, and during that time he has experienced significant worsening of his symptoms.  Discussed with patient about treatment options. Pt has therapeutic lithium level (0.93) and subtherapeutic depakote level (45). He reports good adherence to his medications, but he does not know which medications he takes. He has some concerns that his group home was giving him "the wrong" medications. Discussed with patient that we will resume his home medications but increase dose of depakote. We will also obtain a clozapine level to check if dosage adjustment would help. Pt was in agreement with this plan and he had no further questions, comments, or concerns.   Continued Clinical Symptoms:    The "Alcohol Use Disorders Identification Test", Guidelines for Use in Primary Care, Second Edition.  World Science writerHealth Organization [facility](WHO). Score between 0-7:  no or low risk or alcohol related problems. Score between 8-15:  moderate risk of alcohol related problems. Score between 16-19:  high risk of alcohol related problems. Score 20 or above:  warrants further diagnostic evaluation for alcohol dependence and  treatment.   CLINICAL FACTORS:   Schizophrenia:   Less than 29 years old Paranoid or undifferentiated type   Musculoskeletal: Strength & Muscle Tone: within normal limits Gait & Station: normal Patient leans: N/A  Psychiatric Specialty Exam: Physical Exam  Nursing note and vitals reviewed.   Review of Systems  Constitutional: Negative for chills and fever.  Cardiovascular: Negative for chest pain and palpitations.  Gastrointestinal: Negative for heartburn.  Psychiatric/Behavioral: Negative for depression, hallucinations, substance abuse and suicidal ideas.    Blood pressure 114/68, pulse 91, temperature 97.9 F (36.6 C), temperature source Oral, resp. rate 17, height 5\' 11"  (1.803 m), weight 81.6 kg (180 lb), SpO2 99 %.Body mass index is 25.1 kg/m.  General Appearance: Casual and Fairly Groomed  Eye Contact:  Good  Speech:  Clear and Coherent and Normal Rate  Volume:  Normal  Mood:  Euphoric  Affect:  Blunt, Flat and Labile  Thought Process:  Disorganized and Descriptions of Associations: Loose  Orientation:  Full (Time, Place, and Person)  Thought Content:  Delusions and Hallucinations: Auditory Visual  Suicidal Thoughts:  No  Homicidal Thoughts:  No  Memory:  Immediate;   Fair Recent;   Fair Remote;   Fair  Judgement:  Impaired  Insight:  Lacking  Psychomotor Activity:  Normal  Concentration:  Concentration: Fair  Recall:  [employer]  Fund of Knowledge:  Poor  Language:  Good  Akathisia:  No  Handed:    AIMS (if indicated):     Assets:  Resilience Social Support  ADL's:  Intact  Cognition:  WNL  Sleep:  Number of Hours: 6.75      COGNITIVE FEATURES THAT CONTRIBUTE TO RISK:  None    SUICIDE RISK:   Minimal: No identifiable suicidal ideation.  Patients presenting with no risk factors but with morbid ruminations; may be classified as minimal risk based on the severity of the depressive symptoms  PLAN OF CARE:   - Admit to inpatient level of care  -  Schizoaffective disorder bipolar type  - Continue clozapine 50mg  qDay + 200mg  qhs  - Continue lithium 900mg  qhs  - Change depakote 1500mg  qhs to depakote 200mg  qhs  - Labs: clozapine level, hgbA1c, lipid panel, prolactin, TSH, depakote level on 11/29  - Encourage participation in groups and the therapeutic milieu  - Discharge planning will be ongoing  I certify that inpatient services furnished can reasonably be expected to improve the patient's condition.   Micheal Likenshristopher T Kaliah Haddaway, MD 01/30/2017, 2:47 PM

## 2017-01-30 NOTE — Progress Notes (Signed)
Patient is registered with clozapine [facility]S system and requires monthly CBC with dif for treatment.

## 2017-01-30 NOTE — Progress Notes (Signed)
Recreation Therapy Notes  Date: 01/30/17 Time: 1000 Location: 500 Hall Dayroom  Group Topic: Coping Skills  Goal Area(s) Addresses:  Patient will be able to identify positive coping skills. Patient will be able to identify what holds them back. Patient will be able to identify benefits of using coping skills post d/c.  Behavioral Response: Engaged  Intervention: OrthoptistWeb design, pencils  Activity: OrthoptistWeb Design.  Patients were to identify the things they feel have them struck and prevents them from progressing and write them inside of the web.  Once those things were identified, patients were to come up with at least two coping skills for each setback and write them on the outside of the web.   Education: PharmacologistCoping Skills, Building control surveyorDischarge Planning.   Education Outcome: Acknowledges understanding/In group clarification offered/Needs additional education.   Clinical Observations/Feedback: Pt was very hyper religious.  Pt really wasn't able to follow instructions completely.  Pt stated he uses the spirit to teach, faith to heal and wants to go to the hospital more to pray for others.  Pt was able to express if he used his coping skills post discharge, it would help him be "more mature and love myself more".    Caroll RancherMarjette Anup Brigham, LRT/CTRS       Caroll RancherLindsay, Thamar Holik A 01/30/2017 11:31 AM
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
## 2017-01-30 NOTE — Progress Notes (Signed)
Adult Psychoeducational Group Note  Date:  01/30/2017 Time:  10:53 PM  Group Topic/Focus:  Wrap-Up Group:   The focus of this group is to help patients review their daily goal of treatment and discuss progress on daily workbooks.  Participation Level:  Active  Participation Quality:  Sharing  Affect:  Excited  Cognitive:  Delusional  Insight: Lacking  Engagement in Group:  Engaged  Modes of Intervention:  Socialization and Support  Additional Comments:  Patient attended and participated in group tonight. He reports that today her preached a lot. He talked to people in the spirit. He went to the gym with the group. He went for his meals and attended groups. He spoke with his doctor and stayed calm and prayed.  Lita MainsFrancis, Shakendra Griffeth [facility] 01/30/2017, 10:53 PM

## 2017-01-30 NOTE — Progress Notes (Signed)
Recreation Therapy Notes  INPATIENT RECREATION THERAPY ASSESSMENT  Patient Details Name: Jack [name] MRN: [id number] DOB: [birth year] Today's Date: 01/30/2017  Patient Stressors: Other (Comment)("lady at the group home giving me extra medicine")  Pt stated he was here for seizures.  Coping Skills:   Avoidance, Art/Dance, Talking, Music, Other (Comment)(Prayer)  Personal Challenges: Concentration, Problem-Solving  Leisure Interests (2+):  Exercise - Walking, Music - Singing, Community - Other (Comment)(Preaching and teaching)  Awareness of Community Resources:  No  Patient Strengths:  Blood (love God); Giving  Patient Identified Areas of Improvement:  Start going to the hospitals to pray for people; Love  Current Recreation Participation:  Everyday  Patient Goal for Hospitalization:  "To get out with strength"  [place]  [place] Residence:  [place]  Current [place] (including self-harm):  No  Current HI:  No  Consent to Intern Participation: N/A   Caroll RancherMarjette Tawania Daponte, LRT/CTRS  Caroll RancherLindsay, Keyonda Bickle A 01/30/2017, 12:47 PM

## 2017-01-30 NOTE — Progress Notes (Signed)
Dar Note: Patient presents with labile affect and euphoric mood.  Patient pacing the hallway and dayroom talking to self and responding to internal stimuli.  Patient is delusional pressured, and religiously preoccupied.  States he is [facility] to [facility] at [facility] [employer] [employer].   Reports hearing God's voice and other people around him.  Medications given as prescribed.  Routine safety checks maintained. Attended group and participated.  Patient is safe on the unit.

## 2017-01-31 LAB — LIPID PANEL
Cholesterol: 128 mg/dL (ref 0–200)
HDL: 47 mg/dL (ref >40)
LDL CALC: 60 mg/dL (ref 0–99)
TRIGLYCERIDES: 105 mg/dL (ref <150)
Total CHOL/HDL Ratio: 2.7 RATIO
VLDL: 21 mg/dL (ref 0–40)

## 2017-01-31 LAB — HEMOGLOBIN A1C
HEMOGLOBIN A1C: 5 % (ref 4.8–5.6)
Mean Plasma Glucose: 96.8 mg/dL

## 2017-01-31 LAB — TSH: TSH: 1.318 u[IU]/mL (ref 0.350–4.500)

## 2017-01-31 NOTE — Progress Notes (Signed)
[facility] MD Progress Note  01/31/2017 12:58 PM Jack [name]  MRN:  [id number] Subjective:   Jack [name] is a 31 y/o M with history of schizoaffective bipolar type and intellectual disability who was admitted with worsening symptoms of psychosis and mania. Pt was restarted on home medications and dose of depakote was increased as initial lab level was subtherapeutic. Today upon evaluation, pt reports that he is feeling slightly drowsy this AM. He states, "I feel like I took too many pills." He denies SI/HI. He reports that he slept well overall. Pt remains religiously preoccupied and delusional that he is in direct contact with God and he has special abilities to contact with spirits. During the interview he has a conversation with the spirit of his mother and he provides both voices. He denies VH today. He reports that he is tolerating his medications well overall, but does not want any changes made today. Pt was encouraged to participate in groups and the therapeutic milieu.  Principal Problem: Schizoaffective disorder, bipolar type (HCC) Diagnosis:   Patient Active Problem List   Diagnosis Date Noted  . Bipolar affective disorder, manic, severe (HCC) [F31.13] 01/29/2017  . Agitation [R45.1] 03/09/2015  . Bipolar 1 disorder, manic, moderate (HCC) [F31.12] 03/08/2015  . Schizoaffective disorder, bipolar type (HCC) [F25.0] 01/10/2012  . Intellectual disability [F79] 01/10/2012   Total Time spent with patient: 30 minutes  Past Psychiatric History: see H&P  Past Medical History:  Past Medical History:  Diagnosis Date  . Bipolar 1 disorder (HCC)   . Intellectual disability   . Mental retardation   . Schizophrenia, schizo-affective (HCC)   . Seizures (HCC)    History reviewed. No pertinent surgical history. Family History: History reviewed. No pertinent family history. Family Psychiatric  History: see H&P Social History:  Social History   Substance and Sexual Activity  Alcohol Use No    Social History   Substance and Sexual Activity  Drug Use No    Social History   Socioeconomic History  . Marital status: Single    Spouse name: None  . Number of children: None  . Years of education: None  . Highest education level: None  Social Needs  . Financial resource strain: None  . Food insecurity - worry: None  . Food insecurity - inability: None  . Transportation needs - medical: None  . Transportation needs - non-medical: None  Occupational History  . None  Tobacco Use  . Smoking status: Never Smoker  . Smokeless tobacco: Never Used  Substance and Sexual Activity  . Alcohol use: No  . Drug use: No  . Sexual activity: Not Currently  Other Topics Concern  . None  Social History Narrative  . None   Additional Social History:                         Sleep: Good  Appetite:  Good  Current Medications: Current Facility-Administered Medications  Medication Dose Route Frequency Provider Last Rate Last Dose  . acetaminophen (TYLENOL) tablet 650 mg  650 mg Oral Q4H PRN Charm Rings, NP      . alum & mag hydroxide-simeth (MAALOX/MYLANTA) 200-200-20 MG/5ML suspension 30 mL  30 mL Oral Q4H PRN Charm Rings, NP      . cloZAPine (CLOZARIL) tablet 200 mg  200 mg Oral QHS Charm Rings, NP   200 mg at 01/30/17 2141  . cloZAPine (CLOZARIL) tablet 50 mg  50 mg Oral Daily Lord,  Herminio Heads, NP   50 mg at 01/31/17 0801  . divalproex (DEPAKOTE) DR tablet 2,000 mg  2,000 mg Oral QHS Nwoko, Agnes I, NP   2,000 mg at 01/30/17 2141  . docusate sodium (COLACE) capsule 100 mg  100 mg Oral Daily PRN Charm Rings, NP      . hydrOXYzine (ATARAX/VISTARIL) tablet 50 mg  50 mg Oral Q6H PRN Micheal Likens, MD      . lithium carbonate (ESKALITH) CR tablet 900 mg  900 mg Oral QHS Charm Rings, NP   900 mg at 01/30/17 2141  . magnesium hydroxide (MILK OF MAGNESIA) suspension 30 mL  30 mL Oral Daily PRN Charm Rings, NP      . magnesium oxide (MAG-OX) tablet  400 mg  400 mg Oral Daily Charm Rings, NP   400 mg at 01/31/17 0801  . OLANZapine (ZYPREXA) tablet 10 mg  10 mg Oral Q8H PRN Micheal Likens, MD        Lab Results:  Results for orders placed or performed during the hospital encounter of 01/29/17 (from the past 48 hour(s))  Valproic acid level     Status: Abnormal   Collection Time: 01/30/17  6:15 AM  Result Value Ref Range   Valproic Acid Lvl 45 (L) 50.0 - 100.0 ug/mL    Comment: Performed at [facility], [address] [place], [place] [address]  Hemoglobin A1c     Status: None   Collection Time: 01/31/17  6:44 AM  Result Value Ref Range   Hgb A1c MFr Bld 5.0 4.8 - 5.6 %    Comment: (NOTE) Pre diabetes:          5.7%-6.4% Diabetes:              >6.4% Glycemic control for   <7.0% adults with diabetes    Mean Plasma Glucose 96.8 mg/dL    Comment: Performed at [facility], [address], [place], [place] [address]  TSH     Status: None   Collection Time: 01/31/17  6:44 AM  Result Value Ref Range   TSH 1.318 0.350 - 4.500 uIU/mL    Comment: Performed by a 3rd Generation assay with a functional sensitivity of <=0.01 uIU/mL. Performed at [facility], [address] [place], [place] [address]   Lipid panel     Status: None   Collection Time: 01/31/17  6:44 AM  Result Value Ref Range   Cholesterol 128 0 - 200 mg/dL   Triglycerides 130 <865 mg/dL   HDL 47 >78 mg/dL   Total CHOL/HDL Ratio 2.7 RATIO   VLDL 21 0 - 40 mg/dL   LDL Cholesterol 60 0 - 99 mg/dL    Comment:        Total Cholesterol/HDL:CHD Risk Coronary Heart Disease Risk Table                     Men   Women  1/2 Average Risk   3.4   3.3  Average Risk       5.0   4.4  2 X Average Risk   9.6   7.1  3 X Average Risk  23.4   11.0        Use the calculated Patient Ratio above and the CHD Risk Table to determine the patient's CHD Risk.        ATP III CLASSIFICATION (LDL):  <100     mg/dL  Optimal   100-129  mg/dL   Near or Above                    Optimal  130-159  mg/dL   Borderline  [id number]  mg/dL   High  >914>190     mg/dL   Very High Performed at [facility], [address] [place], [place] [address]     Blood Alcohol level:  Lab Results  Component Value Date   [facility] <10 01/27/2017   ETH <5 03/07/2015    Metabolic Disorder Labs: Lab Results  Component Value Date   HGBA1C 5.0 01/31/2017   MPG 96.8 01/31/2017   No results found for: PROLACTIN Lab Results  Component Value Date   CHOL 128 01/31/2017   TRIG 105 01/31/2017   HDL 47 01/31/2017   CHOLHDL 2.7 01/31/2017   VLDL 21 01/31/2017   LDLCALC 60 01/31/2017    Physical Findings: AIMS:  , ,  ,  ,    CIWA:    COWS:     Musculoskeletal: Strength & Muscle Tone: within normal limits Gait & Station: normal Patient leans: N/A  Psychiatric Specialty Exam: Physical Exam  Nursing note and vitals reviewed.   Review of Systems  Constitutional: Positive for malaise/fatigue. Negative for chills and fever.  Respiratory: Negative for cough and shortness of breath.   Cardiovascular: Negative for chest pain.  Gastrointestinal: Negative for abdominal pain, heartburn, nausea and vomiting.  Psychiatric/Behavioral: Positive for hallucinations. Negative for depression and suicidal ideas.    Blood pressure 100/64, pulse 98, temperature 97.9 F (36.6 C), temperature source Oral, resp. rate 18, height 5\' 11"  (1.803 m), weight 81.6 kg (180 lb), SpO2 99 %.Body mass index is 25.1 kg/m.  General Appearance: Casual and Fairly Groomed  Eye Contact:  Good  Speech:  Clear and Coherent and Normal Rate  Volume:  Normal  Mood:  Euphoric  Affect:  Congruent and Flat  Thought Process:  Disorganized and Descriptions of Associations: Loose  Orientation:  Full (Time, Place, and Person)  Thought Content:  Illogical, Delusions, Hallucinations: Auditory, Ideas of Reference:   Delusions and Rumination  Suicidal Thoughts:  No  Homicidal  Thoughts:  No  Memory:  Immediate;   Good Recent;   Good Remote;   Good  Judgement:  Impaired  Insight:  Lacking  Psychomotor Activity:  Normal  Concentration:  Concentration: Good  Recall:  Fair  Fund of Knowledge:  Fair  Language:  Fair  Akathisia:  No  Handed:    AIMS (if indicated):     Assets:  Manufacturing systems engineerCommunication Skills Physical Health Resilience Social Support  ADL's:  Intact  Cognition:  WNL  Sleep:  Number of Hours: 6.25     Treatment Plan Summary: Daily contact with patient to assess and evaluate symptoms and progress in treatment and Medication management. Pt continues to have AH, delusions, and disorganized behaviors. He slept well overnight and reports that he is tolerating his medications with only side effect being some fatigue this morning. He agrees to continue his current regimen without changes.  - Continue inpatient hospitalization  - Schizoaffective disorder bipolar type             - Continue clozapine 50mg  qDay + 200mg  qhs             - Continue lithium 900mg  qhs             - Continue depakote 2000mg  qhs  - Labs: clozapine level pending, depakote  level on 11/29  - Encourage participation in groups and the therapeutic milieu  - Discharge planning will be ongoing    Micheal Likenshristopher T Bowen Goyal, MD 01/31/2017, 12:58 PM

## 2017-01-31 NOTE — Progress Notes (Signed)
Patient ID: Jack [name], male   DOB: [birth year], 31 y.o.   MRN: [id number]  DAR Note: Pt is confuse, paranoid and delusional. Pt was observe preaching to people that are not there; "I have like 50 people in the spirt that I'm preaching to right now." Pt denied any depression, anxiety, pain, SI, HI or AVH. Pt was med compliant. All patient's questions and concerns addressed. Support, encouragement, and safe environment provided. 15-minute safety checks continue. Pt attended wrap-up group.

## 2017-01-31 NOTE — [facility] Suicide Risk Assessment (Signed)
[facility] INPATIENT:  Family/Significant Other Suicide Prevention Education  Suicide Prevention Education:  Education Completed; Ms Jenne CampusMcQueen, mother, [phone] has been identified by the patient as the family member/significant other with whom the patient will be residing, and identified as the person(s) who will aid the patient in the event of a mental health crisis (suicidal ideations/suicide attempt).  With written consent from the patient, the family member/significant other has been provided the following suicide prevention education, prior to the and/or following the discharge of the patient.  The suicide prevention education provided includes the following:  Suicide risk factors  Suicide prevention and interventions  National Suicide Hotline telephone number  [facility] assessment telephone number  [facility] Emergency Assistance 911  [facility] and/or Residential Mobile Crisis Unit telephone number  Request made of family/significant other to:  Remove weapons (e.g., guns, rifles, knives), all items previously/currently identified as safety concern.    Remove drugs/medications (over-the-counter, prescriptions, illicit drugs), all items previously/currently identified as a safety concern.  The family member/significant other verbalizes understanding of the suicide prevention education information provided.  The family member/significant other agrees to remove the items of safety concern listed above. Mother states [facility]; has slowly been deteriorating over the past 8 months and she is afraid he will no longer be allowed to stay there unless his behavior changes.  He attends a day program and [facility]. Ida RogueRodney B Roniel Halloran 01/31/2017, 8:10 AM

## 2017-01-31 NOTE — Progress Notes (Signed)
Recreation Therapy Notes  Date: 01/31/17 Time: 1000 Location: 500 Hall Dayroom  Group Topic: Anger Management  Goal Area(s) Addresses:  Patient will identify triggers for anger.  Patient will identify physical reaction to anger.   Patient will identify benefit of using coping skills when angry.  Behavioral Response: Engaged  Intervention: Worksheet, pencils  Activity:  Anger Thermometer.  Patients were given a worksheet with a thermometer on it.  Patients were to rank at least 6 things that get them angry from 1-10 (1being the lowest, 10 the highest).  Patients were to then identify coping skills they can use to combat their anger.   Education: Anger Management, Discharge Planning   Education Outcome: Acknowledges education/In group clarification offered/Needs additional education.   Clinical Observations/Feedback: Pt was still hyper religious.  Pt didn't complete follow instructions of the activity.  Pt did state "when people are angry, they get down on themselves".  Pt stated love, faith, teaching, healing and preaching are things he deals with.  Pt identified his coping skills as "love, blood of love, faith and church".   Caroll RancherMarjette Mehtaab Mayeda, LRT/CTRS          Caroll RancherLindsay, Raeqwon Lux A 01/31/2017 11:39 AM
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
## 2017-01-31 NOTE — Progress Notes (Signed)
Adult Psychoeducational Group Note  Date:  01/31/2017 Time:  9:05 PM  Group Topic/Focus:  Wrap-Up Group:   The focus of this group is to help patients review their daily goal of treatment and discuss progress on daily workbooks.  Participation Level:  Active  Participation Quality:  Appropriate  Affect:  Appropriate  Cognitive:  Appropriate  Insight: Appropriate  Engagement in Group:  Engaged  Modes of Intervention:  Discussion  Additional Comments:  Patient attended wrap-up group and said that his day was a 9. His goal for today to keep calm and he did.  Casmira Cramer W Juwan Vences 01/31/2017, 9:05 PM

## 2017-01-31 NOTE — Progress Notes (Signed)
DAR NOTE: Patient presents with calm affect and pleasant mood.  Patient still disorganized, delusional and hyper religious. Denies pain, auditory and visual hallucinations.  Patient observed talking to himself and smiling.  Rates depression at 2, hopelessness at 0, and anxiety at 5.  Maintained on routine safety checks.  Medications given as prescribed.  Support and encouragement offered as needed.  Attended group and participated.  States goal for today is "focus on church."  Patient visible in the dayroom for activity and therapy.  Offered no complaint.

## 2017-02-01 LAB — CLOZAPINE (CLOZARIL)
Clozapine Lvl: 284 ng/mL — ABNORMAL LOW (ref 350–650)
NorClozapine: 68 ng/mL
Total(Cloz+Norcloz): 352 ng/mL

## 2017-02-01 LAB — PROLACTIN: Prolactin: 10.4 ng/mL (ref 4.0–15.2)

## 2017-02-01 NOTE — Tx Team (Signed)
Interdisciplinary Treatment and Diagnostic Plan Update  02/01/2017 Time of Session: 10:56 AM  Jack [name] MRN: [id number]  Principal Diagnosis: Schizoaffective disorder, bipolar type ([place])  Secondary Diagnoses: Principal Problem:   Schizoaffective disorder, bipolar type ([place]) Active Problems:   Intellectual disability   Current Medications:  Current Facility-Administered Medications  Medication Dose Route Frequency Provider Last Rate Last Dose  . acetaminophen (TYLENOL) tablet 650 mg  650 mg Oral Q4H PRN Patrecia Pour, NP      . alum & mag hydroxide-simeth (MAALOX/MYLANTA) 200-200-20 MG/5ML suspension 30 mL  30 mL Oral Q4H PRN Patrecia Pour, NP      . cloZAPine (CLOZARIL) tablet 200 mg  200 mg Oral QHS Patrecia Pour, NP   200 mg at 01/31/17 2107  . cloZAPine (CLOZARIL) tablet 50 mg  50 mg Oral Daily Patrecia Pour, NP   50 mg at 02/01/17 0732  . divalproex (DEPAKOTE) DR tablet 2,000 mg  2,000 mg Oral QHS Lindell Spar I, NP   2,000 mg at 01/31/17 2107  . docusate sodium (COLACE) capsule 100 mg  100 mg Oral Daily PRN Patrecia Pour, NP      . hydrOXYzine (ATARAX/VISTARIL) tablet 50 mg  50 mg Oral Q6H PRN Pennelope Bracken, MD   50 mg at 01/31/17 2222  . lithium carbonate (ESKALITH) CR tablet 900 mg  900 mg Oral QHS Patrecia Pour, NP   900 mg at 01/31/17 2107  . magnesium hydroxide (MILK OF MAGNESIA) suspension 30 mL  30 mL Oral Daily PRN Patrecia Pour, NP      . magnesium oxide (MAG-OX) tablet 400 mg  400 mg Oral Daily Patrecia Pour, NP   400 mg at 02/01/17 0732  . OLANZapine (ZYPREXA) tablet 10 mg  10 mg Oral Q8H PRN Pennelope Bracken, MD        PTA Medications: Medications Prior to Admission  Medication Sig Dispense Refill Last Dose  . cloZAPine (CLOZARIL) 100 MG tablet Take 1 tablet (100 mg total) by mouth daily. (Patient not taking: Reported on 01/27/2017) 30 tablet 0 Not Taking at Unknown time  . cloZAPine (CLOZARIL) 100 MG tablet Take 3 tablets  (300 mg total) by mouth at bedtime. (Patient taking differently: Take 200 mg by mouth at bedtime. ) 90 tablet 0 01/27/2017 at Unknown time  . clozapine (CLOZARIL) 50 MG tablet TK 1 T PO NIGHTLY  1 Not Taking at Unknown time  . divalproex (DEPAKOTE) 500 MG DR tablet Take 1 tablet (500 mg total) by mouth every 12 (twelve) hours. (Patient taking differently: Take 1,500 mg by mouth daily. ) 60 tablet 0 01/27/2017 at 0800  . DOK 100 MG capsule TK 1 C PO BID AS NEEDED FOR CONSTIPATION  1 unknown  . haloperidol (HALDOL) 10 MG tablet TK 1 T PO BID  1 Not Taking at Unknown time  . lithium carbonate (LITHOBID) 300 MG CR tablet Take 3 tablets (900 mg) by mouth daily  1 01/27/2017 at Unknown time  . lithium carbonate 300 MG capsule Take 3 capsules (900 mg total) by mouth at bedtime. (Patient not taking: Reported on 01/27/2017) 90 capsule 0 Not Taking at Unknown time  . LORazepam (ATIVAN) 1 MG tablet TK 1 T PO TID  1 Not Taking at Unknown time  . magnesium oxide (MAG-OX) 400 MG tablet Take 400 mg by mouth daily.   01/27/2017 at Unknown time  . trihexyphenidyl (ARTANE) 2 MG tablet Take 1 tablet (2 mg total)  by mouth 2 (two) times daily with a meal. (Patient not taking: Reported on 01/27/2017) 60 tablet 0 Completed Course at Unknown time    Patient Stressors: Health problems  Patient Strengths: Religious Affiliation  Treatment Modalities: Medication Management, Group therapy, Case management,  1 to 1 session with clinician, Psychoeducation, Recreational therapy.   Physician Treatment Plan for Primary Diagnosis: Schizoaffective disorder, bipolar type ([place]) Long Term Goal(s): Improvement in symptoms so as ready for discharge  Short Term Goals: Ability to identify changes in lifestyle to reduce recurrence of condition will improve Ability to verbalize feelings will improve Ability to identify and develop effective coping behaviors will improve Compliance with prescribed medications will improve  Medication  Management: Evaluate patient's response, side effects, and tolerance of medication regimen.  Therapeutic Interventions: 1 to 1 sessions, Unit Group sessions and Medication administration.  Evaluation of Outcomes: Progressing  Physician Treatment Plan for Secondary Diagnosis: Principal Problem:   Schizoaffective disorder, bipolar type ([place]) Active Problems:   Intellectual disability   Long Term Goal(s): Improvement in symptoms so as ready for discharge  Short Term Goals: Ability to identify changes in lifestyle to reduce recurrence of condition will improve Ability to verbalize feelings will improve Ability to identify and develop effective coping behaviors will improve Compliance with prescribed medications will improve  Medication Management: Evaluate patient's response, side effects, and tolerance of medication regimen.  Therapeutic Interventions: 1 to 1 sessions, Unit Group sessions and Medication administration.  Evaluation of Outcomes: Progressing   RN Treatment Plan for Primary Diagnosis: Schizoaffective disorder, bipolar type ([place]) Long Term Goal(s): Knowledge of disease and therapeutic regimen to maintain health will improve  Short Term Goals: Ability to identify and develop effective coping behaviors will improve and Compliance with prescribed medications will improve  Medication Management: RN will administer medications as ordered by provider, will assess and evaluate patient's response and provide education to patient for prescribed medication. RN will report any adverse and/or side effects to prescribing provider.  Therapeutic Interventions: 1 on 1 counseling sessions, Psychoeducation, Medication administration, Evaluate responses to treatment, Monitor vital signs and CBGs as ordered, Perform/monitor CIWA, COWS, AIMS and Fall Risk screenings as ordered, Perform wound care treatments as ordered.  Evaluation of Outcomes: Progressing   LCSW Treatment Plan for Primary  Diagnosis: Schizoaffective disorder, bipolar type ([place]) Long Term Goal(s): Safe transition to appropriate next level of care at discharge, Engage patient in therapeutic group addressing interpersonal concerns.  Short Term Goals: Engage patient in aftercare planning with referrals and resources  Therapeutic Interventions: Assess for all discharge needs, 1 to 1 time with Social worker, Explore available resources and support systems, Assess for adequacy in community support network, Educate family and significant other(s) on suicide prevention, Complete Psychosocial Assessment, Interpersonal group therapy.  Evaluation of Outcomes: Met  Return to [facility], follow up [facility]   Progress in Treatment: Attending groups: Yes Participating in groups: Yes Taking medication as prescribed: Yes Toleration medication: Yes, no side effects reported at this time Family/Significant other contact made: Yes Patient understands diagnosis: No Limited insight Discussing patient identified problems/goals with staff: Yes Medical problems stabilized or resolved: Yes Denies suicidal/homicidal ideation: Yes Issues/concerns per patient self-inventory: None Other: N/A  New problem(s) identified: None identified at this time.   New Short Term/Long Term Goal(s): "I don't want to go back to the [facility].  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."  Discharge Plan or Barriers:   Reason for Continuation of Hospitalization:  Delusions   Mania  Medication stabilization   Estimated  Length of Stay: 12/3  Attendees: Patient: Jack [name] 02/01/2017  10:56 AM  Physician: Maris Berger, MD 02/01/2017  10:56 AM  Nursing: Sena Hitch, RN 02/01/2017  10:56 AM  RN Care Manager: Lars Pinks, RN 02/01/2017  10:56 AM  Social Worker: Ripley Fraise 02/01/2017  10:56 AM  Recreational Therapist: Winfield Cunas 02/01/2017  10:56 AM  Other: Norberto Sorenson 02/01/2017  10:56 AM  Other:  02/01/2017  10:56 AM    Scribe for  Treatment Team:  Roque Lias LCSW 02/01/2017 10:56 AM

## 2017-02-01 NOTE — Progress Notes (Signed)
[facility] [name] Progress Note  02/01/2017 11:00 AM Jack [name]  MRN:  [id number] Subjective:   Jack [name] is a 31 y/o with history of schizoaffective disorder bipolar type and intellectual disability who was admitted with worsening symptoms of psychosis and mania. 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 Pt also has belief that he is able to communicate with people's spirits. He was restarted on previous medications of depakote, lithium, and clozapine, and he has been monitored on the inpatient psychiatry unit. Today upon evaluation, pt reports that he is doing well overall. His mood is good, and he appears less euphoric than on previous evaluation. He states, "[name] can'[name] wait for my church." Pt denies SI/HI. He denies AH and VH, but he still has two-sided conversations with "spirits" during the interview. He has some symptoms of paranoia that the staff at his group home was giving him "extra pills," and pt was reassured that we were checking his blood levels of his medications. As depakote level was subtherapeutic, we had increased the dose and he is due to recheck blood level tomorrow AM. Clozapine level is still pending. As pt is doing well overall and his sleep pattern has improved, we will begin to check with group home to evaluate if he is nearing his baseline enough to return to his group home in the coming days. Pt was in agreement with the above plan and he had no further questions, comments, or concerns.  Principal Problem: Schizoaffective disorder, bipolar type (HCC) Diagnosis:   Patient Active Problem List   Diagnosis Date Noted  . Bipolar affective disorder, manic, severe (HCC) [F31.13] 01/29/2017  . Agitation [R45.1] 03/09/2015  . Bipolar 1 disorder, manic, moderate (HCC) [F31.12] 03/08/2015  . Schizoaffective disorder, bipolar type (HCC) [F25.0] 01/10/2012  . Intellectual disability [F79] 01/10/2012   Total Time spent with patient: 30  minutes  Past Psychiatric History: see H&P  Past Medical History:  Past Medical History:  Diagnosis Date  . Bipolar 1 disorder (HCC)   . Intellectual disability   . Mental retardation   . Schizophrenia, schizo-affective (HCC)   . Seizures (HCC)    History reviewed. No pertinent surgical history. Family History: History reviewed. No pertinent family history. Family Psychiatric  History: see H&P Social History:  Social History   Substance and Sexual Activity  Alcohol Use No     Social History   Substance and Sexual Activity  Drug Use No    Social History   Socioeconomic History  . Marital status: Single    Spouse name: None  . Number of children: None  . Years of education: None  . Highest education level: None  Social Needs  . Financial resource strain: None  . Food insecurity - worry: None  . Food insecurity - inability: None  . Transportation needs - medical: None  . Transportation needs - non-medical: None  Occupational History  . None  Tobacco Use  . Smoking status: Never Smoker  . Smokeless tobacco: Never Used  Substance and Sexual Activity  . Alcohol use: No  . Drug use: No  . Sexual activity: Not Currently  Other Topics Concern  . None  Social History Narrative  . None   Additional Social History:                         Sleep: Fair  Appetite:  Good  Current Medications: Current Facility-Administered  Medications  Medication Dose Route Frequency Provider Last Rate Last Dose  . acetaminophen (TYLENOL) tablet 650 mg  650 mg Oral Q4H PRN Jack [name], Jack Y, NP      . alum & mag hydroxide-simeth (MAALOX/MYLANTA) 200-200-20 MG/5ML suspension 30 mL  30 mL Oral Q4H PRN Jack [name], Jack Y, NP      . cloZAPine (CLOZARIL) tablet 200 mg  200 mg Oral QHS Jack [name], Jack Y, NP   200 mg at 01/31/17 2107  . cloZAPine (CLOZARIL) tablet 50 mg  50 mg Oral Daily Jack [name], Jack Y, NP   50 mg at 02/01/17 0732  . divalproex (DEPAKOTE) DR tablet 2,000 mg  2,000 mg  Oral QHS Jack [name], Jack I, NP   2,000 mg at 01/31/17 2107  . docusate sodium (COLACE) capsule 100 mg  100 mg Oral Daily PRN Jack [name], Jack Y, NP      . hydrOXYzine (ATARAX/VISTARIL) tablet 50 mg  50 mg Oral Q6H PRN Jack [name], Jack [name]   50 mg at 01/31/17 2222  . lithium carbonate (ESKALITH) CR tablet 900 mg  900 mg Oral QHS Jack [name], Jack Y, NP   900 mg at 01/31/17 2107  . magnesium hydroxide (MILK OF MAGNESIA) suspension 30 mL  30 mL Oral Daily PRN Jack [name], Jack Y, NP      . magnesium oxide (MAG-OX) tablet 400 mg  400 mg Oral Daily Jack [name], Jack Y, NP   400 mg at 02/01/17 0732  . OLANZapine (ZYPREXA) tablet 10 mg  10 mg Oral Q8H PRN Jack [name], Jack Dugo [name], [name]        Lab Results:  Results for orders placed or performed during the [name] encounter of 01/29/17 (from the past 48 hour(s))  Prolactin     Status: None   Collection Time: 01/31/17  6:44 AM  Result Value Ref Range   Prolactin 10.4 4.0 - 15.2 ng/mL    Comment: (NOTE) Performed At: [facility] [address] [place], [place] [id number] Jack SchimkeNagendra Sanjai [name] [name]:[phone]:[phone] Performed at [name], [address] [place], [place] [address]   Hemoglobin A1c     Status: None   Collection Time: 01/31/17  6:44 AM  Result Value Ref Range   Hgb A1c MFr Bld 5.0 4.8 - 5.6 %    Comment: (NOTE) Pre diabetes:          5.7%-6.4% Diabetes:              >6.4% Glycemic control for   <7.0% adults with diabetes    Mean Plasma Glucose 96.8 mg/dL    Comment: Performed at [facility], [address] [place], [place] [address]  TSH     Status: None   Collection Time: 01/31/17  6:44 AM  Result Value Ref Range   TSH 1.318 0.350 - 4.500 uIU/mL    Comment: Performed by a 3rd Generation assay with a functional sensitivity of <=0.01 uIU/mL. Performed at [name], [address] [place], [place] [address]   Lipid panel     Status: None   Collection Time: 01/31/17  6:44 AM  Result  Value Ref Range   Cholesterol 128 0 - 200 mg/dL   Triglycerides 629105 <528<150 mg/dL   HDL 47 >41>40 mg/dL   Total CHOL/HDL Ratio 2.7 RATIO   VLDL 21 0 - 40 mg/dL   LDL Cholesterol 60 0 - 99 mg/dL    Comment:        Total Cholesterol/HDL:CHD Risk Coronary Heart Disease Risk Table  Men   Women  1/2 Average Risk   3.4   3.3  Average Risk       5.0   4.4  2 X Average Risk   9.6   7.1  3 X Average Risk  23.4   11.0        Use the calculated Patient Ratio above and the CHD Risk Table to determine the patient's CHD Risk.        ATP III CLASSIFICATION (LDL):  <100     mg/dL   Optimal  098-119100-129  mg/dL   Near or Above                    Optimal  130-159  mg/dL   Borderline  [id number]  mg/dL   High  >562>190     mg/dL   Very High Performed at [facility], [address] [place], [place] [address]     Blood Alcohol level:  Lab Results  Component Value Date   [facility] <10 01/27/2017   ETH <5 03/07/2015    Metabolic Disorder Labs: Lab Results  Component Value Date   HGBA1C 5.0 01/31/2017   MPG 96.8 01/31/2017   Lab Results  Component Value Date   PROLACTIN 10.4 01/31/2017   Lab Results  Component Value Date   CHOL 128 01/31/2017   TRIG 105 01/31/2017   HDL 47 01/31/2017   CHOLHDL 2.7 01/31/2017   VLDL 21 01/31/2017   LDLCALC 60 01/31/2017    Physical Findings: AIMS:  , ,  ,  ,    CIWA:    COWS:     Musculoskeletal: Strength & Muscle Tone: within normal limits Gait & Station: normal Patient leans: N/A  Psychiatric Specialty Exam: Physical Exam  Nursing note and vitals reviewed.   Review of Systems  Constitutional: Negative for chills and fever.  Cardiovascular: Negative for chest pain.  Gastrointestinal: Negative for heartburn and nausea.  Psychiatric/Behavioral: Positive for hallucinations. Negative for depression and suicidal ideas.    Blood pressure 117/71, pulse 90, temperature 97.7 F (36.5 C), resp. rate 18, height 5\' 11"  (1.803 m), weight  81.6 kg (180 lb), SpO2 99 %.Body mass index is 25.1 kg/m.  General Appearance: Casual  Eye Contact:  Good  Speech:  Clear and Coherent and Normal Rate  Volume:  Normal  Mood:  Euthymic  Affect:  Blunt, Congruent and Flat  Thought Process:  Coherent, Goal Directed and Descriptions of Associations: Loose  Orientation:  Full (Time, Place, and Person)  Thought Content:  Illogical, Delusions, Hallucinations: Auditory, Ideas of Reference:   Paranoia Delusions and Paranoid Ideation  Suicidal Thoughts:  No  Homicidal Thoughts:  No  Memory:  Immediate;   Good Recent;   Good Remote;   Good  Judgement:  Fair  Insight:  Fair  Psychomotor Activity:  Normal  Concentration:  Concentration: Good  Recall:  Good  Fund of Knowledge:  Poor  Language:  Good  Akathisia:  No  Handed:    AIMS (if indicated):     Assets:  Communication Skills Leisure Time Physical Health Resilience Social Support  ADL's:  Intact  Cognition:  WNL  Sleep:  Number of Hours: 6     Treatment Plan Summary: Daily contact with patient to assess and evaluate symptoms and progress in treatment and Medication management. Pt continues to have AH and grandiose delusions, but he appears less pressured and disorganized, and his sleep pattern is improving. We are anticipating depakote level to  be drawn in the AM.  - Continue inpatient hospitalization  - Schizoaffective disorder bipolar type - Continue clozapine 50mg  qDay + 200mg  qhs - Continue lithium 900mg  qhs - Continue depakote 2000mg  qhs  - Labs: clozapine level pending, depakote level on 11/29 AM  - Encourage participation in groups and the therapeutic milieu  - Discharge planning will be ongoing      Jack Likens, [name] 02/01/2017, 11:00 AM

## 2017-02-01 NOTE — Progress Notes (Signed)
Adult Psychoeducational Group Note  Date:  02/01/2017 Time:  9:05 PM  Group Topic/Focus:  Wrap-Up Group:   The focus of this group is to help patients review their daily goal of treatment and discuss progress on daily workbooks.  Participation Level:  Active  Participation Quality:  Appropriate  Affect:  Appropriate  Cognitive:  Appropriate  Insight: Appropriate  Engagement in Group:  Engaged  Modes of Intervention:  Discussion  Additional Comments:  Patient attended group and said that his day was a 10.  Patient said he was excited because he was full with the spirit.   Jendaya Gossett W Maryana Pittmon 02/01/2017, 9:05 PM

## 2017-02-01 NOTE — Progress Notes (Signed)
Recreation Therapy Notes  Date: 02/01/17 Time: 1000 Location: 500 Hall Dayroom  Group Topic: Communication, Team Building, Problem Solving  Goal Area(s) Addresses:  Patient will effectively work with peer towards shared goal.  Patient will identify skill used to make activity successful.  Patient will identify how skills used during activity can be used to reach post d/c goals.   Behavioral Response: None  Intervention: STEM Activity   Activity: [employer]. In teams, patients were asked to build the tallest freestanding tower possible out of 15 pipe cleaners. Systematically resources were removed, for example patient ability to use both hands and patient ability to verbally communicate.    Education: Pharmacist, communityocial Skills, Building control surveyorDischarge Planning.   Education Outcome: Acknowledges education/In group clarification offered/Needs additional education.   Clinical Observations/Feedback: Pt did not participate.  Pt sat and talked to himself the entire group.  When asked what he was talking about, pt stated he was talking in the spirit.   Caroll RancherMarjette Jaquavious Mercer, LRT/CTRS       Caroll RancherLindsay, Aleesha Ringstad A 02/01/2017 12:26 PM

## 2017-02-02 LAB — VALPROIC ACID LEVEL: Valproic Acid Lvl: 107 ug/mL — ABNORMAL HIGH (ref 50.0–100.0)

## 2017-02-02 MED ORDER — CLOZAPINE 25 MG PO TABS
250.0000 mg | ORAL_TABLET | Freq: Every day | ORAL | Status: DC
  Administered 2017-02-02: 250 mg via ORAL
  Filled 2017-02-02 (×3): qty 2

## 2017-02-02 NOTE — Progress Notes (Signed)
Adult Psychoeducational Group Note  Date:  02/02/2017 Time:  10:36 PM  Group Topic/Focus:  Wrap-Up Group:   The focus of this group is to help patients review their daily goal of treatment and discuss progress on daily workbooks.  Participation Level:  Active  Participation Quality:  Appropriate  Affect:  Appropriate  Cognitive:  Appropriate  Insight: Appropriate  Engagement in Group:  Engaged  Modes of Intervention:  Discussion  Additional Comments:  Patient attended group and said that his day was a 10.  His goal for today was to stay holy.  Angelika Jerrett W Isael Stille 02/02/2017, 10:36 PM

## 2017-02-02 NOTE — Progress Notes (Signed)
Pt continues to be religiously preoccupied and responding to voices and visual hallucinations.  He has been talking to people not there and this evening is convinced that staff is trying to kill him.  "I know what you are doing; I know what you are up to!!"  Pt initially refused to talk his medication, but talked to a voice who told him to take it.  He said "It's not going to kill you", as if the voice were speaking and not him.  He has been restless and pacing.  He has also called 911 twice telling them that staff is trying to kill him by giving him the wrong medication.  About an hour after taking his medicine, pt went to his room and lay down.  At this time, he appears to be asleep.  Support and encouragement offered.  Discharge plans are in process.  Safety maintained with q15 minute checks.

## 2017-02-02 NOTE — Progress Notes (Signed)
Recreation Therapy Notes  Date: 02/02/17 Time: 1000 Location: 500 Hall Dayroom  Group Topic: Wellness  Goal Area(s) Addresses:  Patient will define components of whole wellness. Patient will verbalize benefit of whole wellness.  Intervention:  Rubber Disc  Activity: Sharks in the Water.  Each patient was given [name] rubber disc, with one extra disc for the group.  Patients were to use the discs to step on to get from one end of the hall to the other and back.  If anyone stepped off their disc, the group would have to start over from the beginning.   Education: Wellness, Discharge Planning.   Education Outcome: Acknowledges education/In group clarification offered/Needs additional education.   Clinical Observations/Feedback: Pt did not attend group.   Charlean Carneal, LRT/CTRS         Jack [name] 02/02/2017 12:37 PM 

## 2017-02-02 NOTE — Progress Notes (Signed)
[facility] MD Progress Note  02/02/2017 4:12 PM EION TIMBROOK  MRN:  [id number] Subjective:   Jack [name] is a 31 y/o with history of schizoaffective disorder bipolar type and intellectual disability who was admitted with worsening symptoms of psychosis and symptoms of mania. Pt has grandiose delusions that he is a "reverend" who has been selected by God, and he will soon inherit a church. Pt also has belief that he is able to communicate with people's spirits which he has been doing during interviews with medical team. He was restarted on previous medications of depakote, lithium, and clozapine, and he has been monitored on the inpatient psychiatry unit.  Today upon evaluation, pt reports he is doing "[place], because my church is coming soon." He continues to report AH explaining, "I talk to God and spirits." During the interview he changes voices to multiple different tones and appears to have brief conversations with himself as if others are giving commentary on the interview. RN staff reported that pt had episode of agitation and he was disruptive overnight to other peers as he was religiously preoccupied; attempted to discuss this with patient, but he grew irritable and replied, "The spirit moves me when it moves me, and I can't decide when to be quiet." Pt was encouraged to be respectful of others attempting to rest in the hospital and he verbalized understanding. Discussed with patient that we would increase his dose of clozapine because his clozapine level on 01/31/17 was 284 and typically we want a therapeutic clozapine level to be 350-600. Current depakote level today was found to be 107, so that dose will not be adjusted. Pt was in agreement with that plan and he had no further questions, comments, or concerns.    Principal Problem: Schizoaffective disorder, bipolar type (HCC) Diagnosis:   Patient Active Problem List   Diagnosis Date Noted  . Bipolar affective disorder, manic, severe (HCC)  [F31.13] 01/29/2017  . Agitation [R45.1] 03/09/2015  . Bipolar 1 disorder, manic, moderate (HCC) [F31.12] 03/08/2015  . Schizoaffective disorder, bipolar type (HCC) [F25.0] 01/10/2012  . Intellectual disability [F79] 01/10/2012   Total Time spent with patient: 30 minutes  Past Psychiatric History: see H&P  Past Medical History:  Past Medical History:  Diagnosis Date  . Bipolar 1 disorder (HCC)   . Intellectual disability   . Mental retardation   . Schizophrenia, schizo-affective (HCC)   . Seizures (HCC)    History reviewed. No pertinent surgical history. Family History: History reviewed. No pertinent family history. Family Psychiatric  History: see H&P Social History:  Social History   Substance and Sexual Activity  Alcohol Use No     Social History   Substance and Sexual Activity  Drug Use No    Social History   Socioeconomic History  . Marital status: Single    Spouse name: None  . Number of children: None  . Years of education: None  . Highest education level: None  Social Needs  . Financial resource strain: None  . Food insecurity - worry: None  . Food insecurity - inability: None  . Transportation needs - medical: None  . Transportation needs - non-medical: None  Occupational History  . None  Tobacco Use  . Smoking status: Never Smoker  . Smokeless tobacco: Never Used  Substance and Sexual Activity  . Alcohol use: No  . Drug use: No  . Sexual activity: Not Currently  Other Topics Concern  . None  Social History Narrative  . None  Additional Social History:                         Sleep: Fair  Appetite:  Fair  Current Medications: Current Facility-Administered Medications  Medication Dose Route Frequency Provider Last Rate Last Dose  . acetaminophen (TYLENOL) tablet 650 mg  650 mg Oral Q4H PRN Charm RingsLord, Jamison Y, NP      . alum & mag hydroxide-simeth (MAALOX/MYLANTA) 200-200-20 MG/5ML suspension 30 mL  30 mL Oral Q4H PRN Charm RingsLord, Jamison  Y, NP      . cloZAPine (CLOZARIL) tablet 250 mg  250 mg Oral QHS Micheal Likensainville, Phebe Dettmer T, MD      . cloZAPine (CLOZARIL) tablet 50 mg  50 mg Oral Daily Charm RingsLord, Jamison Y, NP   50 mg at 02/02/17 0721  . divalproex (DEPAKOTE) DR tablet 2,000 mg  2,000 mg Oral QHS Nwoko, Agnes I, NP   2,000 mg at 02/01/17 2128  . docusate sodium (COLACE) capsule 100 mg  100 mg Oral Daily PRN Charm RingsLord, Jamison Y, NP      . hydrOXYzine (ATARAX/VISTARIL) tablet 50 mg  50 mg Oral Q6H PRN Micheal Likensainville, Durelle Zepeda T, MD   50 mg at 01/31/17 2222  . lithium carbonate (ESKALITH) CR tablet 900 mg  900 mg Oral QHS Charm RingsLord, Jamison Y, NP   900 mg at 02/01/17 2128  . magnesium hydroxide (MILK OF MAGNESIA) suspension 30 mL  30 mL Oral Daily PRN Charm RingsLord, Jamison Y, NP      . magnesium oxide (MAG-OX) tablet 400 mg  400 mg Oral Daily Charm RingsLord, Jamison Y, NP   400 mg at 02/01/17 0732  . OLANZapine (ZYPREXA) tablet 10 mg  10 mg Oral Q8H PRN Micheal Likensainville, Hart Haas T, MD   10 mg at 02/02/17 16100722    Lab Results:  Results for orders placed or performed during the hospital encounter of 01/29/17 (from the past 48 hour(s))  Valproic acid level     Status: Abnormal   Collection Time: 02/02/17  6:48 AM  Result Value Ref Range   Valproic Acid Lvl 107 (H) 50.0 - 100.0 ug/mL    Comment: Performed at [facility], [address] [place], [place] [address]    Blood Alcohol level:  Lab Results  Component Value Date   [facility] 01/27[facility]2017   ETH <5 03/07/2015    Metabolic Disorder Labs: Lab Results  Component Value Date   HGBA1C 5.0 01/31/2017   MPG 96.8 01/31/2017   Lab Results  Component Value Date   PROLACTIN 10.4 01/31/2017   Lab Results  Component Value Date   CHOL 128 01/31/2017   TRIG 105 01/31/2017   HDL 47 01/31/2017   CHOLHDL 2.7 01/31/2017   VLDL 21 01/31/2017   LDLCALC 60 01/31/2017    Physical Findings: AIMS:  , ,  ,  ,    CIWA:    COWS:     Musculoskeletal: Strength & Muscle Tone: within normal  limits Gait & Station: normal Patient leans: Backward  Psychiatric Specialty Exam: Physical Exam  Nursing note and vitals reviewed.   Review of Systems  Constitutional: Negative for chills and fever.  Respiratory: Negative for cough.   Cardiovascular: Negative for chest pain and palpitations.  Gastrointestinal: Negative for abdominal pain, heartburn, nausea and vomiting.  Psychiatric/Behavioral: Positive for hallucinations. Negative for depression and suicidal ideas.    Blood pressure 124/74, pulse (!) 106, temperature 98.4 F (36.9 C), resp. rate 16, height 5\' 11"  (1.803 m), weight  81.6 kg (180 lb), SpO2 99 %.Body mass index is 25.1 kg/m.  General Appearance: Casual  Eye Contact:  Absent  Speech:  Garbled and Pressured  Volume:  Increased  Mood:  Euphoric  Affect:  Blunt, Congruent and Labile  Thought Process:  Disorganized and Descriptions of Associations: Loose  Orientation:  Full (Time, Place, and Person)  Thought Content:  Delusions, Hallucinations: Auditory and Rumination  Suicidal Thoughts:  No  Homicidal Thoughts:  No  Memory:  Immediate;   Good Recent;   Good Remote;   Good  Judgement:  Impaired  Insight:  Lacking  Psychomotor Activity:  Normal  Concentration:  Concentration: Fair  Recall:  [employer]is[employer]Fair  Fund of Knowledge:  Fair  Language:  Fair  Akathisia:  No  Handed:    AIMS (if indicated):     Assets:  Manufacturing systems engineerCommunication Skills Physical Health Resilience Social Support  ADL's:  Intact  Cognition:  WNL  Sleep:  Number of Hours: 6.25     Treatment Plan Summary: Daily contact with patient to assess and evaluate symptoms and progress in treatment and Medication management. Pt has ongoing AH, poor insight, religious preoccupation, and periodic episodes of psychomotor activation and increased activity, including in the evening which have been difficult to redirect. We will increase evening dose of clozapine.  -Continue inpatient hospitalization  - Schizoaffective  disorder bipolar type - Change continue clozapine 50mg  qDay + 200mg  qhs to clozapine 50mg  qDay + 250mg  qhs (level 284 on 11/27) - Continue lithium 900mg  qhs (level 0.93 on 11/24) - Continuedepakote 2000mg  qhs (level 107 on 11/29)  - Encourage participation in groups and the therapeutic milieu  - Discharge planning will be ongoing    Micheal Likenshristopher T Iktan Aikman, MD 02/02/2017, 4:12 PM

## 2017-02-03 MED ORDER — CLOZAPINE 100 MG PO TABS
300.0000 mg | ORAL_TABLET | Freq: Every day | ORAL | Status: DC
  Administered 2017-02-03 – 2017-02-16 (×12): 300 mg via ORAL
  Filled 2017-02-03 (×15): qty 3

## 2017-02-03 NOTE — Progress Notes (Signed)
[facility] MD Progress Note  02/03/2017 12:25 PM Jack [name]  MRN:  [id number] Subjective:   Jack [name] is a 31 y/o with history of schizoaffective disorder bipolar type and intellectual disability who was admitted with worsening symptoms of psychosis and symptoms of mania. Pt has grandiose delusions that he is a "reverend" who has been selected by God, and he will soon inherit a church. Pt also has belief that he is able to communicate with people's spirits which he has been doing during interviews with medical team. He was restarted on previous medications of depakote, lithium, and clozapine, and he has been monitored on the inpatient psychiatry unit.  Today upon evaluation, pt appears to respond to internal stimuli. At start of interview he appears to speak in tongues and then states, "I feel at church. I'm happy my church is coming for sure now." Pt continues to endorse AH of talking to spirits and he endorses VH of seeing "a vision" but he declines to describe it. He denies SI/HI. He is tolerating his medications well and he is sleeping adequately. RN staff reports pt appears to have increased motor activation after dinner. Pt is somewhat labile during interview, and when asked about his motor activation, he grows irritable and his voice tone became angry. Pt was reassured that he was only being encouraged to be respectful of others on the unit, and he quickly became calm and cheerful again. Discussed with patient about increasing dose of clozapine at bedtime and he was in agreement. He had no further questions, comments, or concerns.    Principal Problem: Schizoaffective disorder, bipolar type (HCC) Diagnosis:   Patient Active Problem List   Diagnosis Date Noted  . Bipolar affective disorder, manic, severe (HCC) [F31.13] 01/29/2017  . Agitation [R45.1] 03/09/2015  . Bipolar 1 disorder, manic, moderate (HCC) [F31.12] 03/08/2015  . Schizoaffective disorder, bipolar type (HCC) [F25.0] 01/10/2012   . Intellectual disability [F79] 01/10/2012   Total Time spent with patient: 30 minutes  Past Psychiatric History: see H&P  Past Medical History:  Past Medical History:  Diagnosis Date  . Bipolar 1 disorder (HCC)   . Intellectual disability   . Mental retardation   . Schizophrenia, schizo-affective (HCC)   . Seizures (HCC)    History reviewed. No pertinent surgical history. Family History: History reviewed. No pertinent family history. Family Psychiatric  History: see H&P Social History:  Social History   Substance and Sexual Activity  Alcohol Use No     Social History   Substance and Sexual Activity  Drug Use No    Social History   Socioeconomic History  . Marital status: Single    Spouse name: None  . Number of children: None  . Years of education: None  . Highest education level: None  Social Needs  . Financial resource strain: None  . Food insecurity - worry: None  . Food insecurity - inability: None  . Transportation needs - medical: None  . Transportation needs - non-medical: None  Occupational History  . None  Tobacco Use  . Smoking status: Never Smoker  . Smokeless tobacco: Never Used  Substance and Sexual Activity  . Alcohol use: No  . Drug use: No  . Sexual activity: Not Currently  Other Topics Concern  . None  Social History Narrative  . None   Additional Social History:                         Sleep: Good  Appetite:  Good  Current Medications: Current Facility-Administered Medications  Medication Dose Route Frequency Provider Last Rate Last Dose  . acetaminophen (TYLENOL) tablet 650 mg  650 mg Oral Q4H PRN Charm RingsLord, Jamison Y, NP      . alum & mag hydroxide-simeth (MAALOX/MYLANTA) 200-200-20 MG/5ML suspension 30 mL  30 mL Oral Q4H PRN Charm RingsLord, Jamison Y, NP      . cloZAPine (CLOZARIL) tablet 250 mg  250 mg Oral QHS Micheal Likensainville, Lyndi Holbein T, MD   250 mg at 02/02/17 2049  . cloZAPine (CLOZARIL) tablet 50 mg  50 mg Oral Daily Charm RingsLord,  Jamison Y, NP   50 mg at 02/03/17 0855  . divalproex (DEPAKOTE) DR tablet 2,000 mg  2,000 mg Oral QHS Nwoko, Agnes I, NP   2,000 mg at 02/02/17 2049  . docusate sodium (COLACE) capsule 100 mg  100 mg Oral Daily PRN Charm RingsLord, Jamison Y, NP      . hydrOXYzine (ATARAX/VISTARIL) tablet 50 mg  50 mg Oral Q6H PRN Micheal Likensainville, Daysi Boggan T, MD   50 mg at 01/31/17 2222  . lithium carbonate (ESKALITH) CR tablet 900 mg  900 mg Oral QHS Charm RingsLord, Jamison Y, NP   900 mg at 02/02/17 2049  . magnesium hydroxide (MILK OF MAGNESIA) suspension 30 mL  30 mL Oral Daily PRN Charm RingsLord, Jamison Y, NP      . magnesium oxide (MAG-OX) tablet 400 mg  400 mg Oral Daily Charm RingsLord, Jamison Y, NP   400 mg at 02/03/17 1203  . OLANZapine (ZYPREXA) tablet 10 mg  10 mg Oral Q8H PRN Micheal Likensainville, Chayton Murata T, MD   10 mg at 02/02/17 16100722    Lab Results:  Results for orders placed or performed during the hospital encounter of 01/29/17 (from the past 48 hour(s))  Valproic acid level     Status: Abnormal   Collection Time: 02/02/17  6:48 AM  Result Value Ref Range   Valproic Acid Lvl 107 (H) 50.0 - 100.0 ug/mL    Comment: Performed at [facility], [address] [place], [place] [address]    Blood Alcohol level:  Lab Results  Component Value Date   [facility] <10 01/27/2017   ETH <5 03/07/2015    Metabolic Disorder Labs: Lab Results  Component Value Date   HGBA1C 5.0 01/31/2017   MPG 96.8 01/31/2017   Lab Results  Component Value Date   PROLACTIN 10.4 01/31/2017   Lab Results  Component Value Date   CHOL 128 01/31/2017   TRIG 105 01/31/2017   HDL 47 01/31/2017   CHOLHDL 2.7 01/31/2017   VLDL 21 01/31/2017   LDLCALC 60 01/31/2017    Physical Findings: AIMS:  , ,  ,  ,    CIWA:    COWS:     Musculoskeletal: Strength & Muscle Tone: within normal limits Gait & Station: normal Patient leans: N/A  Psychiatric Specialty Exam: Physical Exam  Nursing note and vitals reviewed.   Review of Systems   Constitutional: Negative for chills and fever.  Respiratory: Negative for cough and shortness of breath.   Cardiovascular: Negative for chest pain.  Gastrointestinal: Negative for abdominal pain, heartburn, nausea and vomiting.  Psychiatric/Behavioral: Positive for hallucinations. Negative for depression, substance abuse and suicidal ideas. The patient is not nervous/anxious and does not have insomnia.     Blood pressure 125/81, pulse (!) 104, temperature (!) 97.5 F (36.4 C), temperature source Oral, resp. rate 16, height 5\' 11"  (1.803 m), weight 81.6 kg (180 lb), SpO2 99 %.Body mass  index is 25.1 kg/m.  General Appearance: Casual and Fairly Groomed  Eye Contact:  Good  Speech:  Garbled and Pressured  Volume:  Increased  Mood:  Euphoric and Irritable  Affect:  Blunt and Labile  Thought Process:  Disorganized and Descriptions of Associations: Loose  Orientation:  Full (Time, Place, and Person)  Thought Content:  Illogical, Delusions, Hallucinations: Auditory Visual, Ideas of Reference:   Paranoia Delusions and Rumination  Suicidal Thoughts:  No  Homicidal Thoughts:  No  Memory:  Immediate;   Good Recent;   Good Remote;   Good  Judgement:  Impaired  Insight:  Lacking  Psychomotor Activity:  Increased  Concentration:  Concentration: Fair  Recall:  Fair  Fund of Knowledge:  Poor  Language:  Fair  Akathisia:  No  Handed:    AIMS (if indicated):     Assets:  Housing Physical Health Resilience Social Support  ADL's:  Intact  Cognition:  WNL  Sleep:  Number of Hours: 6.75     Treatment Plan Summary: Daily contact with patient to assess and evaluate symptoms and progress in treatment and Medication management. Pt remains pressured with psychomotor activation, AH, VH, delusions, and religious preccupation. We will increase dose of clozapine at bedtime tonight.  -Continue inpatient hospitalization  - Schizoaffective disorder bipolar type - Change clozapine  50mg  qDay + 250mg  qhs (level 284 on 11/27) to clozapine 50mg  qDay + 300mg  qhs - Continue lithium 900mg  qhs (level 0.93 on 11/24) - Continuedepakote 2000mg  qhs (level 107 on 11/29)  - Encourage participation in groups and the therapeutic milieu  - Discharge planning will be ongoing    Micheal Likenshristopher T Kiani Wurtzel, MD 02/03/2017, 12:25 PM

## 2017-02-03 NOTE — Progress Notes (Signed)
Patient has been pacing the halls preaching to unseen others, patient became agitated and began yelling at peer in an unidentifiable language.  Patient needed much redirection but was able to calm   Assess patient for safety, offer medications as prescribed, engage patient in 1:1 staff talks.   Patient able to contract for safety.

## 2017-02-03 NOTE — Progress Notes (Signed)
Pt is more calm and pleasant this evening compared to yesterday evening.  He is still responding to voices and turns his head right and left as if he is talking to people who are not there.  This evening and yesterday evening he seems afraid that he is going to have a seizure and wants to have his VS checked.  Tonight when told that his VS is fine, he wanted the MHT to stay with him in his room until he went to sleep.  He was more cooperative tonight.  He took his medications without any issues.  He denies SI/HI/AVH, even though he is constantly talking to himself.  Support and encouragement offered.  Discharge plans are in process.  Safety maintained with q15 minute checks.

## 2017-02-03 NOTE — Progress Notes (Signed)
Adult Psychoeducational Group Note  Date:  02/03/2017 Time:  8:38 PM  Group Topic/Focus:  Wrap-Up Group:   The focus of this group is to help patients review their daily goal of treatment and discuss progress on daily workbooks.  Participation Level:  Active  Participation Quality:  Appropriate  Affect:  Appropriate  Cognitive:  Appropriate  Insight: Appropriate  Engagement in Group:  Engaged  Modes of Intervention:  Discussion  Additional Comments:  The patient expressed that he rates today a 10.The patient also said that his goal was to keep his strength.  Octavio Mannshigpen, Mazella Deen Lee 02/03/2017, 8:38 PM

## 2017-02-03 NOTE — Progress Notes (Signed)
Recreation Therapy Notes  Date: 02/03/17 Time: 1000 Location: 500 Hall Dayroom  Group Topic: Leisure Education  Goal Area(s) Addresses:  Patient will identify positive leisure activities.  Patient will identify one positive benefit of participation in leisure activities.   Behavioral Response: Engaged  Intervention: AT[employer]TWhite board, dry erase marker, eraser, various words  Activity:  Pictionary.  LRT divided the group into two teams.  Each team was given one minute to draw/guess their picture.  If they did not guess the correct answer, the other team got a chance to still the point.    Education:  Leisure Education, Building control surveyorDischarge Planning  Education Outcome: Acknowledges education/In group clarification offered/Needs additional education  Clinical Observations/Feedback: Pt was active in group but was still talking to himself when he wasn't at the board drawing.  Pt was still hyper religious.    Caroll RancherMarjette Talicia Sui, LRT/CTRS         Caroll RancherLindsay, Udell Mazzocco A 02/03/2017 11:55 AM

## 2017-02-03 NOTE — [facility] Group Notes (Signed)
Attended spiritual care group facilitated by Chaplain Tannya Gonet, MDiv   Group focused on topic of "hope"  Patients identified how they defined hope in their lives and engaged in facilitated conversation around topic.  Participated in visual explorer activity, finding images that resonated with their concept of hope for today.    

## 2017-02-04 DIAGNOSIS — R440 Auditory hallucinations: Secondary | ICD-10-CM

## 2017-02-04 MED ORDER — LORAZEPAM 1 MG PO TABS
1.0000 mg | ORAL_TABLET | Freq: Four times a day (QID) | ORAL | Status: DC | PRN
  Administered 2017-02-04 – 2017-02-14 (×12): 1 mg via ORAL
  Filled 2017-02-04 (×12): qty 1

## 2017-02-04 NOTE — Progress Notes (Signed)
Patient is isolative while out in the milieu, pacing and talking in tongues with pressured speech, most speech is incomprehensible. Holding bible, smiling, talking, pacing, tapping on day room window. Introduced himself as Immunologist"Trevaughn, I'm a Education officer, environmentalpastor". Compliant with medications and routine with minimal interaction with peers and staff.

## 2017-02-04 NOTE — [facility] Group Notes (Signed)
  [facility] LCSW Group Therapy Note  Date/Time:  02/04/2017 11:15AM-12:00PM  Type of Therapy and Topic:  Group Therapy:  Feelings About Hospitalization  Participation Level:  Active   Description of Group This process group involved patients discussing their feelings related to being hospitalized, as well as the benefits they see to being in the hospital.  These feelings and benefits were itemized.  The group then brainstormed specific ways in which they could seek those same benefits when they discharge and return home.  Therapeutic Goals 1. Patient will identify and describe positive and negative feelings related to hospitalization 2. Patient will verbalize benefits of hospitalization to themselves personally 3. 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  His speech was pressured and illogical.  Therapeutic Modalities Cognitive Behavioral Therapy Motivational Interviewing    Ambrose MantleMareida Grossman-Orr, LCSW 02/04/2017, 12:13 PM

## 2017-02-04 NOTE — Progress Notes (Signed)
D: Pt denies SI/HI/AVH. Pt has been sleep majority of the evening. Pt not given night meds due to pt being too lethargic this evening  A: Pt was offered support and encouragement. Pt was given scheduled medications. Pt was encourage to attend groups. Q 15 minute checks were done for safety.   R: safety maintained on unit.

## 2017-02-04 NOTE — Progress Notes (Signed)
Adult Psychoeducational Group Note  Date:  02/04/2017 Time:  9:42 PM  Group Topic/Focus:  Wrap-Up Group:   The focus of this group is to help patients review their daily goal of treatment and discuss progress on daily workbooks.  Participation Level:  Did Not Attend  Participation Quality:  Did not attend  Affect:  Did not attend  Cognitive:  Did not attend  Insight: None  Engagement in Group:  Did not attend  Modes of Intervention:  Did not attend  Additional Comments:  Pt did not attend evening wrap up group tonight.  Jack FurnaceChristopher  [name] 02/04/2017, 9:42 PM

## 2017-02-04 NOTE — Progress Notes (Signed)
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 Seen resting in bed. patient present with loud pressured and disorganized speech, patient is hypervariable and hyperreligous. Denies suicidal or homicidal ideation during this assessments. Patient is tangential.  Jillyn HiddenGary reports " I will hurt the spirt of who doesn't   believe in me" Patient reports he is medication compliant and tolerating medications well. Noted that patient continues to be Paranoid and preoccupied with thoughts.Support, encouragement and reassurance was provided.    Principal Problem: Schizoaffective disorder, bipolar type (HCC) Diagnosis:   Patient Active Problem List   Diagnosis Date Noted  . Bipolar affective disorder, manic, severe (HCC) [F31.13] 01/29/2017  . Agitation [R45.1] 03/09/2015  . Bipolar 1 disorder, manic, moderate (HCC) [F31.12] 03/08/2015  . Schizoaffective disorder, bipolar type (HCC) [F25.0] 01/10/2012  . Intellectual disability [F79] 01/10/2012   Total Time spent with patient: 30 minutes  Past Psychiatric History: see H&P  Past Medical History:  Past Medical History:  Diagnosis Date  . Bipolar 1 disorder (HCC)   . Intellectual disability   . Mental retardation   . Schizophrenia, schizo-affective (HCC)   . Seizures (HCC)    History reviewed. No pertinent surgical history. Family History: History reviewed. No pertinent family history. Family Psychiatric  History: see H&P Social History:  Social History   Substance and Sexual Activity  Alcohol Use No     Social History   Substance and Sexual Activity  Drug Use No    Social History   Socioeconomic History  . Marital status: Single    Spouse name: None  . Number of children: None  . Years of education: None  . Highest education level: None   Social Needs  . Financial resource strain: None  . Food insecurity - worry: None  . Food insecurity - inability: None  . Transportation needs - medical: None  . Transportation needs - non-medical: None  Occupational History  . None  Tobacco Use  . Smoking status: Never Smoker  . Smokeless tobacco: Never Used  Substance and Sexual Activity  . Alcohol use: No  . Drug use: No  . Sexual activity: Not Currently  Other Topics Concern  . None  Social History Narrative  . None   Additional Social History:                         Sleep: Good  Appetite:  Good  Current Medications: Current Facility-Administered Medications  Medication Dose Route Frequency Provider Last Rate Last Dose  . acetaminophen (TYLENOL) tablet 650 mg  650 mg Oral Q4H PRN Charm RingsLord, Jamison Y, NP      . alum & mag hydroxide-simeth (MAALOX/MYLANTA) 200-200-20 MG/5ML suspension 30 mL  30 mL Oral Q4H PRN Charm RingsLord, Jamison Y, NP      . cloZAPine (CLOZARIL) tablet 300 mg  300 mg Oral QHS Micheal Likensainville, [name] T, MD   300 mg at 02/03/17 2106  . cloZAPine (CLOZARIL) tablet 50 mg  50 mg Oral Daily Charm RingsLord, Jamison Y, NP   50 mg at 02/04/17 0859  . divalproex (DEPAKOTE) DR tablet 2,000 mg  2,000 mg Oral QHS Armandina StammerNwoko, Agnes I, NP   2,000 mg at 02/03/17 2106  . docusate sodium (COLACE) capsule 100 mg  100  mg Oral Daily PRN Charm RingsLord, Jamison Y, NP      . hydrOXYzine (ATARAX/VISTARIL) tablet 50 mg  50 mg Oral Q6H PRN Micheal Likensainville, [name] T, MD   50 mg at 02/03/17 2108  . lithium carbonate (ESKALITH) CR tablet 900 mg  900 mg Oral QHS Charm RingsLord, Jamison Y, NP   900 mg at 02/03/17 2105  . magnesium hydroxide (MILK OF MAGNESIA) suspension 30 mL  30 mL Oral Daily PRN Charm RingsLord, Jamison Y, NP      . magnesium oxide (MAG-OX) tablet 400 mg  400 mg Oral Daily Charm RingsLord, Jamison Y, NP   400 mg at 02/04/17 0859  . OLANZapine (ZYPREXA) tablet 10 mg  10 mg Oral Q8H PRN Micheal Likensainville, [name] T, MD   10 mg at 02/02/17 [id number]    Lab Results:  No results found  for this or any previous visit (from the past 48 hour(s)).  Blood Alcohol level:  Lab Results  Component Value Date   ETH <10 01/27/2017   ETH <5 03/07/2015    Metabolic Disorder Labs: Lab Results  Component Value Date   HGBA1C 5.0 01/31/2017   MPG 96.8 01/31/2017   Lab Results  Component Value Date   PROLACTIN 10.4 01/31/2017   Lab Results  Component Value Date   CHOL 128 01/31/2017   TRIG 105 01/31/2017   HDL 47 01/31/2017   CHOLHDL 2.7 01/31/2017   VLDL 21 01/31/2017   LDLCALC 60 01/31/2017    Physical Findings: AIMS: Facial and Oral Movements Muscles of Facial Expression: None, normal Lips and Perioral Area: None, normal Jaw: None, normal Tongue: None, normal,Extremity Movements Upper (arms, wrists, hands, fingers): None, normal Lower (legs, knees, ankles, toes): None, normal, Trunk Movements Neck, shoulders, hips: None, normal, Overall Severity Severity of abnormal movements (highest score from questions above): None, normal Incapacitation due to abnormal movements: None, normal Patient's awareness of abnormal movements (rate only patient's report): No Awareness, Dental Status Current problems with teeth and/or dentures?: No Does patient usually wear dentures?: No  CIWA:    COWS:     Musculoskeletal: Strength & Muscle Tone: within normal limits Gait & Station: normal Patient leans: N/A  Psychiatric Specialty Exam: Physical Exam  Nursing note and vitals reviewed. Constitutional: He appears well-developed.  Cardiovascular: Normal rate.  Neurological: He is alert.  Psychiatric: He has a normal mood and affect. His behavior is normal.    Review of Systems  Psychiatric/Behavioral: Positive for hallucinations. Negative for depression, substance abuse and suicidal ideas. The patient is not nervous/anxious and does not have insomnia.     Blood pressure 125/81, pulse (!) 104, temperature (!) 97.5 F (36.4 C), temperature source Oral, resp. rate 16, height 5'  11" (1.803 m), weight 81.6 kg (180 lb), SpO2 99 %.Body mass index is 25.1 kg/m.  General Appearance: Casual and Fairly Groomed  Eye Contact:  Good  Speech:  Garbled and Pressured  Volume:  Increased  Mood:  Euphoric and Irritable  Affect:  Blunt and Labile  Thought Process:  Disorganized and Descriptions of Associations: Loose  Orientation:  Full (Time, Place, and Person)  Thought Content:  Delusions, Hallucinations: Auditory, Ideas of Reference:   Delusions and Paranoid Ideation  Suicidal Thoughts:  No  Homicidal Thoughts:  No  Memory:  Immediate;   Good Recent;   Good Remote;   Good  Judgement:  Impaired  Insight:  Lacking  Psychomotor Activity:  Increased  Concentration:  Concentration: Fair  Recall:  Fair  Fund of Knowledge:  Poor  Language:  Fair  Akathisia:  No  Handed:    AIMS (if indicated):     Assets:  Housing Physical Health Resilience Social Support  ADL's:  Intact  Cognition:  WNL  Sleep:  Number of Hours: 6.25     Treatment Plan Summary: Daily contact with patient to assess and evaluate symptoms and progress in treatment and Medication management.   -Continue with current treatment plan on 02/04/2017 except where noted   - Schizoaffective disorder bipolar type -Contiune clozapine 50mg  qDay + 250mg  qhs (level 284 on 11/27) to clozapine 50mg  qDay + 300mg  qhs - Continue lithium 900mg  qhs (level 0.93 on 11/24) - Continuedepakote 2000mg  qhs (level 107 on 11/29)  - Encourage participation in groups and the therapeutic milieu - Discharge planning will be ongoing    Oneta Rack, NP 02/04/2017, 2:54 PM

## 2017-02-04 NOTE — Progress Notes (Signed)
At the beginning of the shift, pt was sitting in the dayroom watching TV and talking to the voices he is constantly hearing.  He was pleasant with Clinical research associatewriter at that time.  Later when offered his evening medications, pt became verbally aggressive and began arguing with the voices about whether to take the meds.  He went to the phone to call "his mother", then returned to the med window.  Pt insists staff is trying to kill him and giving him the wrong medicine.  After yelling nonsensical words, he did take the medication, but stayed agitated and pacing the hall.  Writer came back around to come on the unit, and when the pt rushed toward the door while it was open.  The MHT sprang to prevent the pt from leaving.  Pt was not touched during this incident.  The phones were turned off as pt has been calling 911 when he is upset.  Pt paced for over an hour before settling down and going to bed.  He is in bed asleep at this time.  Support and encouragement was offered to patient.  Discharge plans are in process.  Safety maintained with q15 minute checks.

## 2017-02-05 NOTE — Progress Notes (Signed)
Present in milieu, pacing and speaking in tongues much of the time. No dialogue with others, but talks about being a Programmer, multimediapreacher and has frequent loud interjections of religious speech. Fixed smile but becomes loud and angry when taking meds, yelling incomprehensibly yet taking them.

## 2017-02-05 NOTE — Plan of Care (Signed)
Patient continues delusional with outbursts but has not injured self or others. Sleeping has improved and patient independent with ADL's and hygiene is good.

## 2017-02-05 NOTE — Progress Notes (Signed)
D: Pt denies SI/HI/AVH, pt seen on the unit responding to internal stimuli. Pt paces the hall , speaks in tongues before taking his medications and talks to "[name]" after taking his medications. Pt is pleasant and cooperative. Pt needs redirection at times. Pt was given his night medications early due to him getting very anxious and starting to to get ramped up.  A: Pt was offered support and encouragement. Pt was given scheduled medications. Pt was encourage to attend groups. Q 15 minute checks were done for safety.   R:Pt attends groups and interacts well with peers and staff. Pt is taking medication. Pt has no complaints.Pt receptive to treatment and safety maintained on unit.

## 2017-02-05 NOTE — [facility] Group Notes (Signed)
[facility] LCSW Group Therapy Note  Date/Time:  02/05/2017  11:00AM-12:00PM  Type of Therapy and Topic:  Group Therapy:  Music and Mood  Participation Level:  Minimal   Description of Group: In this process group, members listened to a variety of genres of music and identified that different types of music evoke different responses.  Patients were encouraged to identify music that was soothing for them and music that was energizing for them.  Patients discussed how this knowledge can help with wellness and recovery in various ways including managing depression and anxiety as well as encouraging healthy sleep habits.    Therapeutic Goals: 1. Patients will explore the impact of different varieties of music on mood 2. Patients will verbalize the thoughts they have when listening to different types of music 3. Patients will identify music that is soothing to them as well as music that is energizing to them 4. Patients will discuss how to use this knowledge to assist in maintaining wellness and recovery 5. Patients will explore the use of music as a coping skill  Summary of Patient Progress:  At the beginning of group, patient was not present.  He only arrived at the end of group for the last 2 songs and said they made him feel happy.  Therapeutic Modalities: Solution Focused Brief Therapy Motivational Interviewing Activity   Ambrose MantleMareida Grossman-Orr, LCSW 02/05/2017 8:23 AM

## 2017-02-05 NOTE — [facility] Group Notes (Signed)
[facility] Group Notes:  (Nursing)  Date:  02/05/2017  Time:  1:30 Type of Therapy:  Nurse Education  Participation Level:  Active  Participation Quality:  Appropriate  Affect:  Appropriate  Cognitive:  Disorganized and Delusional  Insight:  Limited  Engagement in Group:  Engaged  Modes of Intervention:  Discussion  Summary of Progress/Problems:  Pt was attentive and engaged during group Shela NevinValerie S Muadh Creasy 02/05/2017, 3:44 PM

## 2017-02-05 NOTE — Progress Notes (Signed)
[facility] MD Progress Note  02/05/2017 8:02 AM Jack [name]  MRN:  [id number]   Subjective:  Jack [name] reports " that are trying to kill me." seen standing at the medication window,patient continues to need encouragement to take medications  Objective:  Jack [name] is awake, alert. Patient continues to present with tangential and disorganized thoughts.  Patient observed responding to internal stimuli. Jack [name] is hypervariable, hyper religous seen pacing the unit and talking in tongs. Patient is paranoid and preoccupied with thoughts Continues to deny suicidal or homicidal ideation. Patient is tangential.   Patient reports he is medication compliant and tolerating medications well. Jack [name] reports he slept well with medication, however reports he would like to try and stop taken medication, because he is a [employer], encouragement and reassurance was provided.    Principal Problem: Schizoaffective disorder, bipolar type (HCC) Diagnosis:   Patient Active Problem List   Diagnosis Date Noted  . Bipolar affective disorder, manic, severe (HCC) [F31.13] 01/29/2017  . Agitation [R45.1] 03/09/2015  . Bipolar 1 disorder, manic, moderate (HCC) [F31.12] 03/08/2015  . Schizoaffective disorder, bipolar type (HCC) [F25.0] 01/10/2012  . Intellectual disability [F79] 01/10/2012   Total Time spent with patient: 30 minutes  Past Psychiatric History: see H&P  Past Medical History:  Past Medical History:  Diagnosis Date  . Bipolar 1 disorder (HCC)   . Intellectual disability   . Mental retardation   . Schizophrenia, schizo-affective (HCC)   . Seizures (HCC)    History reviewed. No pertinent surgical history. Family History: History reviewed. No pertinent family history. Family Psychiatric  History: see H&P Social History:  Social History   Substance and Sexual Activity  Alcohol Use No     Social History   Substance and Sexual Activity  Drug Use No    Social History   Socioeconomic History  .  Marital status: Single    Spouse name: None  . Number of children: None  . Years of education: None  . Highest education level: None  Social Needs  . Financial resource strain: None  . Food insecurity - worry: None  . Food insecurity - inability: None  . Transportation needs - medical: None  . Transportation needs - non-medical: None  Occupational History  . None  Tobacco Use  . Smoking status: Never Smoker  . Smokeless tobacco: Never Used  Substance and Sexual Activity  . Alcohol use: No  . Drug use: No  . Sexual activity: Not Currently  Other Topics Concern  . None  Social History Narrative  . None   Additional Social History:                         Sleep: Good  Appetite:  Good  Current Medications: Current Facility-Administered Medications  Medication Dose Route Frequency Provider Last Rate Last Dose  . acetaminophen (TYLENOL) tablet 650 mg  650 mg Oral Q4H PRN Charm Rings, NP      . alum & mag hydroxide-simeth (MAALOX/MYLANTA) 200-200-20 MG/5ML suspension 30 mL  30 mL Oral Q4H PRN Charm Rings, NP      . cloZAPine (CLOZARIL) tablet 300 mg  300 mg Oral QHS Jack Likens, MD   300 mg at 02/03/17 2106  . cloZAPine (CLOZARIL) tablet 50 mg  50 mg Oral Daily Charm Rings, NP   50 mg at 02/05/17 0759  . divalproex (DEPAKOTE) DR tablet 2,000 mg  2,000 mg Oral QHS Jack Kava, NP  2,000 mg at 02/03/17 2106  . docusate sodium (COLACE) capsule 100 mg  100 mg Oral Daily PRN Charm RingsLord, Jack Y, NP      . hydrOXYzine (ATARAX/VISTARIL) tablet 50 mg  50 mg Oral Q6H PRN Jack Likensainville, Jack T, MD   50 mg at 02/03/17 2108  . lithium carbonate (ESKALITH) CR tablet 900 mg  900 mg Oral QHS Charm RingsLord, Jack Y, NP   900 mg at 02/03/17 2105  . LORazepam (ATIVAN) tablet 1 mg  1 mg Oral Q6H PRN Jack RackLewis, Jack Piehl N, NP   1 mg at 02/05/17 0758  . magnesium hydroxide (MILK OF MAGNESIA) suspension 30 mL  30 mL Oral Daily PRN Charm RingsLord, Jack Y, NP      . magnesium oxide  (MAG-OX) tablet 400 mg  400 mg Oral Daily Charm RingsLord, Jack Y, NP   400 mg at 02/04/17 0859  . OLANZapine (ZYPREXA) tablet 10 mg  10 mg Oral Q8H PRN Jack Likensainville, Jack T, MD   10 mg at 02/05/17 [id number]    Lab Results:  No results found for this or any previous visit (from the past 48 hour(s)).  Blood Alcohol level:  Lab Results  Component Value Date   ETH <10 01/27/2017   ETH <5 03/07/2015    Metabolic Disorder Labs: Lab Results  Component Value Date   HGBA1C 5.0 01/31/2017   MPG 96.8 01/31/2017   Lab Results  Component Value Date   PROLACTIN 10.4 01/31/2017   Lab Results  Component Value Date   CHOL 128 01/31/2017   TRIG 105 01/31/2017   HDL 47 01/31/2017   CHOLHDL 2.7 01/31/2017   VLDL 21 01/31/2017   LDLCALC 60 01/31/2017    Physical Findings: AIMS: Facial and Oral Movements Muscles of Facial Expression: None, normal Lips and Perioral Area: None, normal Jaw: None, normal Tongue: None, normal,Extremity Movements Upper (arms, wrists, hands, fingers): None, normal Lower (legs, knees, ankles, toes): None, normal, Trunk Movements Neck, shoulders, hips: None, normal, Overall Severity Severity of abnormal movements (highest score from questions above): None, normal Incapacitation due to abnormal movements: None, normal Patient's awareness of abnormal movements (rate only patient's report): No Awareness, Dental Status Current problems with teeth and/or dentures?: No Does patient usually wear dentures?: No  CIWA:    COWS:     Musculoskeletal: Strength & Muscle Tone: within normal limits Gait & Station: normal Patient leans: [name]/A  Psychiatric Specialty Exam: Physical Exam  Nursing note and vitals reviewed. Constitutional: He appears well-developed.  Cardiovascular: Normal rate.  Neurological: He is alert.  Psychiatric: He has a normal mood and affect.    Review of Systems  Psychiatric/Behavioral: Positive for hallucinations. Negative for depression, substance  abuse and suicidal ideas. The patient is not nervous/anxious and does not have insomnia.     Blood pressure 117/65, pulse 97, temperature 98.3 F (36.8 C), temperature source Oral, resp. rate 16, height 5\' 11"  (1.803 m), weight 81.6 kg (180 lb), SpO2 99 %.Body mass index is 25.1 kg/m.  General Appearance: Casual  Eye Contact:  Good  Speech:  Garbled and Pressured  Volume:  Increased  Mood:  Euphoric and Irritable  Affect:  Blunt and Labile  Thought Process:  Disorganized and Descriptions of Associations: Tangential  Orientation:  Full (Time, Place, and Person)  Thought Content:  Delusions, Hallucinations: Auditory, Ideas of Reference:   Delusions and Paranoid Ideation  Suicidal Thoughts:  No  Homicidal Thoughts:  No  Memory:  Immediate;   Good Recent;   Good Remote;   Good  Judgement:  Impaired  Insight:  Lacking  Psychomotor Activity:  Increased  Concentration:  Concentration: Fair  Recall:  Fair  Fund of Knowledge:  Poor  Language:  Fair  Akathisia:  No  Handed:    AIMS (if indicated):     Assets:  Housing Physical Health Resilience Social Support  ADL's:  Intact  Cognition:  WNL  Sleep:  Number of Hours: 9.75     Treatment Plan Summary: Daily contact with patient to assess and evaluate symptoms and progress in treatment and Medication management.   -Continue with current treatment plan on 02/05/2017 except where noted   - Schizoaffective disorder bipolar type -Contiune clozapine 50mg  qDay + 250mg  qhs (level 284 on 11/27) to clozapine 50mg  qDay + 300mg  qhs - Continue lithium 900mg  qhs (level 0.93 on 11/24) - Continuedepakote 2000mg  qhs (level 107 on 11/29)  - Initiated  Ativan 1 mg PO PRN Q6 for anxiety and agitation   - Encourage participation in groups and the therapeutic milieu - Discharge planning will be ongoing   Jack Rackanika [name] Aundria Bitterman, NP 02/05/2017, 8:02 AM

## 2017-02-05 NOTE — Plan of Care (Signed)
Pt paces the unit hyper religous, pt safe on the unit at this time

## 2017-02-06 MED ORDER — ZIPRASIDONE MESYLATE 20 MG IM SOLR
20.0000 mg | Freq: Once | INTRAMUSCULAR | Status: AC
  Administered 2017-02-06: 20 mg via INTRAMUSCULAR
  Filled 2017-02-06 (×2): qty 20

## 2017-02-06 MED ORDER — LORAZEPAM 2 MG/ML IJ SOLN
2.0000 mg | Freq: Once | INTRAMUSCULAR | Status: AC
  Administered 2017-02-06: 2 mg via INTRAMUSCULAR
  Filled 2017-02-06: qty 1

## 2017-02-06 MED ORDER — DIPHENHYDRAMINE HCL 50 MG/ML IJ SOLN
50.0000 mg | Freq: Once | INTRAMUSCULAR | Status: AC
  Administered 2017-02-06: 50 mg via INTRAMUSCULAR
  Filled 2017-02-06 (×2): qty 1

## 2017-02-06 NOTE — Progress Notes (Signed)
DAR NOTE: Patient presents with pleasant affect and mood.  Denies pain, auditory and visual hallucinations.  Described energy level as normal and concentration as good.  Rates depression at 10, hopelessness at 10, and anxiety at 3.  Maintained on routine safety checks.  Medications given as prescribed.  Support and encouragement offered as needed.  Attended group and participated.  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 Offered no complaint.

## 2017-02-06 NOTE — Progress Notes (Signed)
[facility]  02/06/2017 2:14 PM Jack HoseGary T [name]  MRN:  [id number]   Subjective:  Jack HiddenGary reports "feeling okay"   Objective:  Jack [name] was evaluated by MD and NP.  Patient present smiling, pleasant and less hyperactive during this assessment. Patient is inquiring about discharge. Discussed plan for group assessment.  Patient continues to present with tangential and disorganized thoughts. Some Jack HiddenGary continues to be preoccupied with religion.  Suicidal or homicidal ideation was denied.  Patient reports he is medication compliant and tolerating medications well.  Support, encouragement and reassurance was provided.    Principal Problem: Schizoaffective disorder, bipolar type (HCC) Diagnosis:   Patient Active Problem List   Diagnosis Date Noted  . Bipolar affective disorder, manic, severe (HCC) [F31.13] 01/29/2017  . Agitation [R45.1] 03/09/2015  . Bipolar 1 disorder, manic, moderate (HCC) [F31.12] 03/08/2015  . Schizoaffective disorder, bipolar type (HCC) [F25.0] 01/10/2012  . Intellectual disability [F79] 01/10/2012   Total Time spent with patient: 30 minutes  Past Psychiatric History: see H&P  Past Medical History:  Past Medical History:  Diagnosis Date  . Bipolar 1 disorder (HCC)   . Intellectual disability   . Mental retardation   . Schizophrenia, schizo-affective (HCC)   . Seizures (HCC)    History reviewed. No pertinent surgical history. Family History: History reviewed. No pertinent family history. Family Psychiatric  History: see H&P Social History:  Social History   Substance and Sexual Activity  Alcohol Use No     Social History   Substance and Sexual Activity  Drug Use No    Social History   Socioeconomic History  . Marital status: Single    Spouse name: None  . Number of children: None  . Years of education: None  . Highest education level: None  Social Needs  . Financial resource strain: None  . Food insecurity - worry: None  . Food  insecurity - inability: None  . Transportation needs - medical: None  . Transportation needs - non-medical: None  Occupational History  . None  Tobacco Use  . Smoking status: Never Smoker  . Smokeless tobacco: Never Used  Substance and Sexual Activity  . Alcohol use: No  . Drug use: No  . Sexual activity: Not Currently  Other Topics Concern  . None  Social History Narrative  . None   Additional Social History:                         Sleep: Good  Appetite:  Good  Current Medications: Current Facility-Administered Medications  Medication Dose Route Frequency Provider Last Rate Last Dose  . acetaminophen (TYLENOL) tablet 650 mg  650 mg Oral Q4H PRN Charm RingsLord, Jamison Y, NP      . alum & mag hydroxide-simeth (MAALOX/MYLANTA) 200-200-20 MG/5ML suspension 30 mL  30 mL Oral Q4H PRN Charm RingsLord, Jamison Y, NP      . cloZAPine (CLOZARIL) tablet 300 mg  300 mg Oral QHS Micheal Likensainville, [name] T, MD   300 mg at 02/05/17 2002  . cloZAPine (CLOZARIL) tablet 50 mg  50 mg Oral Daily Charm RingsLord, Jamison Y, NP   50 mg at 02/06/17 0836  . divalproex (DEPAKOTE) DR tablet 2,000 mg  2,000 mg Oral QHS Armandina StammerNwoko, Agnes I, NP   2,000 mg at 02/05/17 2002  . docusate sodium (COLACE) capsule 100 mg  100 mg Oral Daily PRN Charm RingsLord, Jamison Y, NP      . hydrOXYzine (ATARAX/VISTARIL) tablet 50 mg  50  mg Oral Q6H PRN Micheal Likens, MD   50 mg at 02/05/17 2003  . lithium carbonate (ESKALITH) CR tablet 900 mg  900 mg Oral QHS Charm Rings, NP   900 mg at 02/05/17 2001  . LORazepam (ATIVAN) tablet 1 mg  1 mg Oral Q6H PRN Oneta Rack, NP   1 mg at 02/05/17 1912  . magnesium hydroxide (MILK OF MAGNESIA) suspension 30 mL  30 mL Oral Daily PRN Charm Rings, NP      . magnesium oxide (MAG-OX) tablet 400 mg  400 mg Oral Daily Charm Rings, NP   400 mg at 02/06/17 0836  . OLANZapine (ZYPREXA) tablet 10 mg  10 mg Oral Q8H PRN Micheal Likens, MD   10 mg at 02/05/17 4098    Lab Results:  No results  found for this or any previous visit (from the past 48 hour(s)).  Blood Alcohol level:  Lab Results  Component Value Date   ETH <10 01/27/2017   ETH <5 03/07/2015    Metabolic Disorder Labs: Lab Results  Component Value Date   HGBA1C 5.0 01/31/2017   MPG 96.8 01/31/2017   Lab Results  Component Value Date   PROLACTIN 10.4 01/31/2017   Lab Results  Component Value Date   CHOL 128 01/31/2017   TRIG 105 01/31/2017   HDL 47 01/31/2017   CHOLHDL 2.7 01/31/2017   VLDL 21 01/31/2017   LDLCALC 60 01/31/2017    Physical Findings: AIMS: Facial and Oral Movements Muscles of Facial Expression: None, normal Lips and Perioral Area: None, normal Jaw: None, normal Tongue: None, normal,Extremity Movements Upper (arms, wrists, hands, fingers): None, normal Lower (legs, knees, ankles, toes): None, normal, Trunk Movements Neck, shoulders, hips: None, normal, Overall Severity Severity of abnormal movements (highest score from questions above): None, normal Incapacitation due to abnormal movements: None, normal Patient's awareness of abnormal movements (rate only patient's report): No Awareness, Dental Status Current problems with teeth and/or dentures?: No Does patient usually wear dentures?: No  CIWA:    COWS:     Musculoskeletal: Strength & Muscle Tone: within normal limits Gait & Station: normal Patient leans: N/A  Psychiatric Specialty Exam: Physical Exam  Nursing note and vitals reviewed. Constitutional: He appears well-developed.  Cardiovascular: Normal rate.  Neurological: He is alert.  Psychiatric: He has a normal mood and affect.    Review of Systems  Psychiatric/Behavioral: Positive for hallucinations. Negative for depression, substance abuse and suicidal ideas. The patient is not nervous/anxious and does not have insomnia.     Blood pressure (!) 100/41, pulse 92, temperature 98 F (36.7 C), temperature source Oral, resp. rate 16, height 5\' 11"  (1.803 m), weight  81.6 kg (180 lb), SpO2 99 %.Body mass index is 25.1 kg/m.  General Appearance: Casual, pleasant and smiling   Eye Contact:  Good  Speech:  Garbled and Pressured  Volume:  Increased  Mood:  Euphoric and Irritable  Affect:  Blunt and Labile  Thought Process:  Disorganized and Descriptions of Associations: Tangential- however is redirectable   Orientation:  Full (Time, Place, and Person)  Thought Content:  Delusions, Hallucinations: Auditory, Ideas of Reference:   Delusions and Paranoid Ideation  Suicidal Thoughts:  No  Homicidal Thoughts:  No  Memory:  Immediate;   Good Recent;   Good Remote;   Good  Judgement:  Impaired  Insight:  Lacking  Psychomotor Activity:  Increased  Concentration:  Concentration: Fair  Recall:  Fair  Fund of Knowledge:  Poor  Language:  Fair  Akathisia:  No  Handed:    AIMS (if indicated):     Assets:  Housing Physical Health Resilience Social Support  ADL's:  Intact  Cognition:  WNL  Sleep:  Number of Hours: 6.5     Treatment Plan Summary: Daily contact with patient to assess and evaluate symptoms and progress in treatment and Medication management.   -Continue with current treatment plan on 02/06/2017 except where noted   - Schizoaffective disorder bipolar type -Contiune clozapine 50mg  qDay + 250mg  qhs (level 284 on 11/27) to clozapine 50mg  qDay + 300mg  qhs - Continue lithium 900mg  qhs (level 0.93 on 11/24) - Continuedepakote 2000mg  qhs (level 107 on 11/29)  - Continue  Ativan 1 mg PO PRN Q6 for anxiety and agitation   - Encourage participation in groups and the therapeutic milieu - Discharge planning will be ongoing   Oneta Rackanika N Tobi Leinweber, NP 02/06/2017, 2:14 PM

## 2017-02-06 NOTE — Progress Notes (Signed)
"  yall trying to kill me with those heart attack pills". Pt actively on unit pacing yelling shouting and refusing his medication. Pt continuously talking to "terrell" and other people not seen by staff.

## 2017-02-06 NOTE — Progress Notes (Signed)
Recreation Therapy Notes  Date: 02/06/17 Time: 1000 Location: 500 Hall Dayroom  Group Topic: Coping Skills  Goal Area(s) Addresses:  Patient will be able to identify positive coping skills. Patient will be able to identify the importance of using coping skills. Patient will be able to identify the benefits of using coping skills post d/c.  Behavioral Response: Minimal  Intervention: Mind map, pencils, white board, marker, eraser  Activity: Mind map.  Patients were given a blank mind map.  LRT and patients filled in the first eight boxes together with anxiety, mourning a loss, depression, stop smoking, not being able to sleep, anger, loneliness and pain.  Individually, patients were to identify at least three coping skills for each situation identified.  Group would come back together and LRT would right the coping skills identified on the board.  Education: PharmacologistCoping Skills, Building control surveyorDischarge Planning.   Education Outcome: Acknowledges understanding/In group clarification offered/Needs additional education.   Clinical Observations/Feedback: Pt still hyper religious.  Pt did not complete the activity as instructed.  Pt stated he filled out the worksheet the way the Lord instructed him to. Pt identified faith/believing as a coping skill for everything.    Caroll RancherMarjette Cheyna Retana, LRT/CTRS      Lillia AbedLindsay, Sreekar Broyhill A 02/06/2017 11:59 AM
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
## 2017-02-06 NOTE — Tx Team (Signed)
Interdisciplinary Treatment and Diagnostic Plan Update  02/06/2017 Time of Session: 4:35 PM  Jack [name] MRN: [id number]  Principal Diagnosis: Schizoaffective disorder, bipolar type [facility])  Secondary Diagnoses: Principal Problem:   Schizoaffective disorder, bipolar type ([place]) Active Problems:   Intellectual disability   Current Medications:  Current Facility-Administered Medications  Medication Dose Route Frequency Provider Last Rate Last Dose  . acetaminophen (TYLENOL) tablet 650 mg  650 mg Oral Q4H PRN Patrecia Pour, NP      . alum & mag hydroxide-simeth (MAALOX/MYLANTA) 200-200-20 MG/5ML suspension 30 mL  30 mL Oral Q4H PRN Patrecia Pour, NP      . cloZAPine (CLOZARIL) tablet 300 mg  300 mg Oral QHS Pennelope Bracken, MD   300 mg at 02/05/17 2002  . cloZAPine (CLOZARIL) tablet 50 mg  50 mg Oral Daily Patrecia Pour, NP   50 mg at 02/06/17 0836  . divalproex (DEPAKOTE) DR tablet 2,000 mg  2,000 mg Oral QHS Lindell Spar I, NP   2,000 mg at 02/05/17 2002  . docusate sodium (COLACE) capsule 100 mg  100 mg Oral Daily PRN Patrecia Pour, NP      . hydrOXYzine (ATARAX/VISTARIL) tablet 50 mg  50 mg Oral Q6H PRN Pennelope Bracken, MD   50 mg at 02/05/17 2003  . lithium carbonate (ESKALITH) CR tablet 900 mg  900 mg Oral QHS Patrecia Pour, NP   900 mg at 02/05/17 2001  . LORazepam (ATIVAN) tablet 1 mg  1 mg Oral Q6H PRN Derrill Center, NP   1 mg at 02/05/17 1912  . magnesium hydroxide (MILK OF MAGNESIA) suspension 30 mL  30 mL Oral Daily PRN Patrecia Pour, NP      . magnesium oxide (MAG-OX) tablet 400 mg  400 mg Oral Daily Patrecia Pour, NP   400 mg at 02/06/17 0836  . OLANZapine (ZYPREXA) tablet 10 mg  10 mg Oral Q8H PRN Pennelope Bracken, MD   10 mg at 02/05/17 0759    PTA Medications: Medications Prior to Admission  Medication Sig Dispense Refill Last Dose  . cloZAPine (CLOZARIL) 100 MG tablet Take 1 tablet (100 mg total) by mouth daily. (Patient not  taking: Reported on 01/27/2017) 30 tablet 0 Not Taking at Unknown time  . cloZAPine (CLOZARIL) 100 MG tablet Take 3 tablets (300 mg total) by mouth at bedtime. (Patient taking differently: Take 200 mg by mouth at bedtime. ) 90 tablet 0 01/27/2017 at Unknown time  . clozapine (CLOZARIL) 50 MG tablet TK 1 T PO NIGHTLY  1 Not Taking at Unknown time  . divalproex (DEPAKOTE) 500 MG DR tablet Take 1 tablet (500 mg total) by mouth every 12 (twelve) hours. (Patient taking differently: Take 1,500 mg by mouth daily. ) 60 tablet 0 01/27/2017 at 0800  . DOK 100 MG capsule TK 1 C PO BID AS NEEDED FOR CONSTIPATION  1 unknown  . haloperidol (HALDOL) 10 MG tablet TK 1 T PO BID  1 Not Taking at Unknown time  . lithium carbonate (LITHOBID) 300 MG CR tablet Take 3 tablets (900 mg) by mouth daily  1 01/27/2017 at Unknown time  . lithium carbonate 300 MG capsule Take 3 capsules (900 mg total) by mouth at bedtime. (Patient not taking: Reported on 01/27/2017) 90 capsule 0 Not Taking at Unknown time  . LORazepam (ATIVAN) 1 MG tablet TK 1 T PO TID  1 Not Taking at Unknown time  . magnesium oxide (MAG-OX) 400  MG tablet Take 400 mg by mouth daily.   01/27/2017 at Unknown time  . trihexyphenidyl (ARTANE) 2 MG tablet Take 1 tablet (2 mg total) by mouth 2 (two) times daily with a meal. (Patient not taking: Reported on 01/27/2017) 60 tablet 0 Completed Course at Unknown time    Patient Stressors: Health problems  Patient Strengths: Religious Affiliation  Treatment Modalities: Medication Management, Group therapy, Case management,  1 to 1 session with clinician, Psychoeducation, Recreational therapy.   Physician Treatment Plan for Primary Diagnosis: Schizoaffective disorder, bipolar type ([place]) Long Term Goal(s): Improvement in symptoms so as ready for discharge  Short Term Goals: Ability to identify changes in lifestyle to reduce recurrence of condition will improve Ability to verbalize feelings will improve Ability to  identify and develop effective coping behaviors will improve Compliance with prescribed medications will improve  Medication Management: Evaluate patient's response, side effects, and tolerance of medication regimen.  Therapeutic Interventions: 1 to 1 sessions, Unit Group sessions and Medication administration.  Evaluation of Outcomes: Progressing   12/3:  Both mother and [facility]  manager report patient is more fixated on his delusion, is agitated and pacing, is paranid and accusing, and is not grounded enough to be able to successfully return to [facility].  Will adjust meds accordingly  Physician Treatment Plan for Secondary Diagnosis: Principal Problem:   Schizoaffective disorder, bipolar type ([place]) Active Problems:   Intellectual disability   Long Term Goal(s): Improvement in symptoms so as ready for discharge  Short Term Goals: Ability to identify changes in lifestyle to reduce recurrence of condition will improve Ability to verbalize feelings will improve Ability to identify and develop effective coping behaviors will improve Compliance with prescribed medications will improve  Medication Management: Evaluate patient's response, side effects, and tolerance of medication regimen.  Therapeutic Interventions: 1 to 1 sessions, Unit Group sessions and Medication administration.  Evaluation of Outcomes: Progressing   RN Treatment Plan for Primary Diagnosis: Schizoaffective disorder, bipolar type ([place]) Long Term Goal(s): Knowledge of disease and therapeutic regimen to maintain health will improve  Short Term Goals: Ability to identify and develop effective coping behaviors will improve and Compliance with prescribed medications will improve  Medication Management: RN will administer medications as ordered by provider, will assess and evaluate patient's response and provide education to patient for prescribed medication. RN will report any adverse and/or side effects to prescribing  provider.  Therapeutic Interventions: 1 on 1 counseling sessions, Psychoeducation, Medication administration, Evaluate responses to treatment, Monitor vital signs and CBGs as ordered, Perform/monitor CIWA, COWS, AIMS and Fall Risk screenings as ordered, Perform wound care treatments as ordered.  Evaluation of Outcomes: Progressing   LCSW Treatment Plan for Primary Diagnosis: Schizoaffective disorder, bipolar type ([place]) Long Term Goal(s): Safe transition to appropriate next level of care at discharge, Engage patient in therapeutic group addressing interpersonal concerns.  Short Term Goals: Engage patient in aftercare planning with referrals and resources  Therapeutic Interventions: Assess for all discharge needs, 1 to 1 time with Social worker, Explore available resources and support systems, Assess for adequacy in community support network, Educate family and significant other(s) on suicide prevention, Complete Psychosocial Assessment, Interpersonal group therapy.  Evaluation of Outcomes: Met  Return to [facility], follow up [facility]   Progress in Treatment: Attending groups: Yes Participating in groups: Yes Taking medication as prescribed: Yes Toleration medication: Yes, no side effects reported at this time Family/Significant other contact made: Yes Patient understands diagnosis: No Limited insight Discussing patient identified problems/goals with staff: Yes Medical problems  stabilized or resolved: Yes Denies suicidal/homicidal ideation: Yes Issues/concerns per patient self-inventory: None Other: N/A  New problem(s) identified: None identified at this time.   New Short Term/Long Term Goal(s): "[facility]  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."  Discharge Plan or Barriers:   Reason for Continuation of Hospitalization:  Delusions   Mania  Medication stabilization   Estimated Length of Stay: 12/7  Attendees: Patient: 02/06/2017  4:35 PM  Physician: Maris Berger, MD 02/06/2017  4:35 PM  Nursing: Sena Hitch, RN 02/06/2017  4:35 PM  RN Care Manager: Lars Pinks, RN 02/06/2017  4:35 PM  Social Worker: Ripley Fraise 02/06/2017  4:35 PM  Recreational Therapist: Winfield Cunas 02/06/2017  4:35 PM  Other: Norberto Sorenson 02/06/2017  4:35 PM  Other:  02/06/2017  4:35 PM    Scribe for Treatment Team:  Roque Lias LCSW 02/06/2017 4:35 PM

## 2017-02-06 NOTE — Progress Notes (Signed)
02/06/17 2100  Description of events or circumstances leading to restrictive event  Precipitating circumstances leading to onset of behavior pt came out of group yelling and refusing to take his medication  Patient Behavior Refusing Medication;Threatening Posture/Words  Clinical Justification for Restrictive Event  Imminent danger to: Self;Others  Patient is restrained for the purpose of administering medication Yes  Less Restrictive Interventions Tried Prior to Seclusion/Restraint  Comfort Interventions used prior to seclusion or restraint Food/Drink;Elimination Needs  Therapeutic Interventions used prior to seclusion or restraint Redirection;Show of Support;Remove from Situation;Verbal Deescalation  Diversionary Interventions used prior to seclusion or restraint Other (Comment) (tried to convince pt take medication)  Other  Interventions used prior to seclusion or restraint Medication (offered PO medication)  Patient's response to Less Restrictive Intervention Increase in Behavior  Interventions used in Restrictive Event Upper Arm Shoulder Hold;Other (Comment) (pt held down on bed to administer medication)  RN Initiating Seclusion or Restraint Tianna Baus  Date Seclusion or Restraint initiated 02/06/17  Time Seclusion or Restraint Initiated 2100  Restriction order entered into order management Yes  Criteria for release from seclusion or restraint Successful Medication Administration  Patient informed of Criteria for Release? Yes  Admission Assessment & Health History reviewed & considered prior to intervention? Yes  Pre-existing medical conditions, disabilities, or limitations that place pt at greater risk for seclusion or restraint? None  Health Status prior to intervention No problems noted  [facility] Adminstrator Notification  [facility] Administrator on call notified Yes  [facility] on call notified TORI  Date [facility] notified 02/06/17  Time [facility]  [facility]urse Administrator notified 2100  Reason [facility] on call notified Seclusion or Restraint Episode  RN Debriefing  Physical Wellbeing problems noted No  Psychological Wellbeing problems noted No  Privacy issues identified None identified  Triggers that led to episode pt tends to have the same repeat episode every evening after group  Patient's Plan for Alternative Behaviors/Responses to Triggers in Future N/A  Plan of Reentry into Milieu (after seclusion or restraint episode ended): Limitations  Describe limitations pt remain calm on the unit  Complaints of Injury No  Treatment Plan Completed Yes  Incident Entered in Seclusion/Restraint Log? Yes  Seclusion/Restraint 15-minute checks  Signs of injury with seclusion/restraint No  Food offered (offer hourly and prn) Yes  Fluid offered (offer hourly and prn) Yes  Meets Criteria for Release Yes  Patient helped to meet criteria for release Yes  Privacy maintained Yes  Monitored 1:1 No  Hygiene/Toileting Yes  Respiratory Status Fast  Circulation/Skin check Normal color;Diaphoretic  Passive Range of Motion if in 4-point restraints Not applicable  Physical status/Comfort Normal movement  Psychological Status/Comfort Alert  Behavior Lying or sitting  Restraint Status Not applicable  Seclusion Status Not applicable  Manual Hold Status Manual_hold  Post Seclusion/Restraint Checks  Post Seclusion/Restraint Signs of injury No  Post Seclusion/Restraint Food Offered Yes  Post Seclusion/Restraint Fluid Offered Yes  Post Seclusion/Restraint Privacy Maintained Yes  Post Seclusion/Restraint Monitored 1:1 No  Post Seclusion/Restraint Hygiene/Toileting Yes  Post Seclusion/Restraint Respiratory Status Fast  Post Seclusion/Restraint Circulation/Skin Check Normal color  Post Seclusion/Restraint Physical Status/Comfort Normal movement  Post Seclusion/Restraint Physiological Status/Comfort Oriented  Describe Post Seclusion/Restraint Behavior  Quiet   Pt came out of group, yelling pacing the unit very hyper verbal . Pt was refusing medications stating he was not going to take his medications. Pt could not be redirected to his room. Per PA pt [facility].  Pt was encouraged to walk to his room and he went willing after a show of force. After in pt room pt was given the opportunity to tak his medication PO. Pt refused and began yelling and was verbally aggressive. Pt was placed in manual hold for 1 minute while IM's were administered. Pt appeared to calm down afterwards, pt was given ice water , and snacks and pt  Allowed us to get his vital signs and they were entered in flowsheets

## 2017-02-06 NOTE — Progress Notes (Signed)
Adult Psychoeducational Group Note  Date:  02/06/2017 Time:  9:41 PM  Group Topic/Focus:  wrap up group  Participation Level:  Minimal  Participation Quality:  Sharing  Affect:  Irritable  Cognitive:  Delusional  Insight: Limited  Engagement in Group:  Engaged  Modes of Intervention:  Socialization and Support  Additional Comments:  Patient attended and participated in group tonight. He reports having a blessed day. He meditated, talk to the spirits. Patient advised that he believe something is going on with the food,  Scot DockFrancis, Pierre Dellarocco Dacosta 02/06/2017, 9:41 PM

## 2017-02-06 NOTE — Progress Notes (Signed)
Pt pacing the unit yelling stating he is not going to take his medication. Pt stated someone was trying to kill him. Pt speaking in tongues. Pt visibly hallucinating. Pt talking to people not seen by Clinical research associatewriter.

## 2017-02-07 MED ORDER — CLOZAPINE 100 MG PO TABS
100.0000 mg | ORAL_TABLET | Freq: Every day | ORAL | Status: DC
  Administered 2017-02-08: 100 mg via ORAL
  Filled 2017-02-07 (×3): qty 1

## 2017-02-07 MED ORDER — GABAPENTIN 300 MG PO CAPS
300.0000 mg | ORAL_CAPSULE | Freq: Three times a day (TID) | ORAL | Status: DC
  Administered 2017-02-07 – 2017-02-20 (×39): 300 mg via ORAL
  Filled 2017-02-07 (×47): qty 1

## 2017-02-07 NOTE — Progress Notes (Signed)
Recreation Therapy Notes  Date: 02/07/17 Time: 0950 Location: 500 Hall Dayroom  Group Topic: Goal Setting  Goal Area(s) Addresses:  Patient will be able to identify at least 3 life goals.  Patient will be able to identify benefit of investing in life goals.  Patient will be able to identify benefit of setting life goals.   Behavioral Response:  Engaged  Intervention: Worksheet  Activity: Life Goals.  Patients were given [name] worksheet broken down into 6 categories (family, friends, work/school, spirituality, body and mental health).  Patients were to identify what they were doing well, what they need to improve and make [name] goal to complete the improvement.  Education:  Discharge Planning, Coping Skills, Life Goals  Education Outcome: Acknowledges Education/In Group Clarification Provided/Needs Additional Education  Clinical Observations: Pt was still hyper religious and focused on his faith and church.  Pt stated his top categories were family, friends and work/school.  Pt stated he was focused on "healing, teaching, preaching, having holy friends and having more faith".    Jack [name], LRT/CTRS     Jack RancherLindsay, Jack [name] 02/07/2017 11:14 AM

## 2017-02-07 NOTE — Progress Notes (Signed)
[facility] MD Progress Note  02/07/2017 12:58 PM Jack [name]  MRN:  [id number] Subjective:   Jack [name] is a 31 y/o with history of schizoaffective disorder bipolar type and intellectual disability who was admitted with worsening symptoms of psychosis andsymptoms of mania. Pt has grandiose delusions that he is a"reverend" who has beenselectedby God, and he willinherit a church. Pt also has belief that he is able to communicate with people's spirits. He was restarted on previous medications of depakote, lithium, and clozapine with doses increased, and he has been monitored on the inpatient psychiatry unit.  Today upon evaluation,pt continues to be pressured, labile, and religiously preoccupied. He states, "I don't care, pretty soon I'll be getting my church." Pt continues to pace and have intermittent episodes of agitation and yelling out. RN staff notes that pt's hyperactivity seems to be concentrated in the afternoon and early evening. Pt responds to internal stimuli during the interview such as by having a conversation with himself and different "spirits" such as that of his mother, but he denies AH and VH stating, "That's my business." Pt denies SI/HI. Pt was visited by his group home staff who reported that he appears pressured and more religiously preoccupied than his baseline. Discussed with patient that we will increase AM dose of clozapine and start on gabapentin for his intrusive behaviors. Pt grew irritable and stated that the medical team was trying to poison him (using multiple different voices), and then he abruptly left the interview.   Principal Problem: Schizoaffective disorder, bipolar type (HCC) Diagnosis:   Patient Active Problem List   Diagnosis Date Noted  . Bipolar affective disorder, manic, severe (HCC) [F31.13] 01/29/2017  . Agitation [R45.1] 03/09/2015  . Bipolar 1 disorder, manic, moderate (HCC) [F31.12] 03/08/2015  . Schizoaffective disorder, bipolar type (HCC) [F25.0]  01/10/2012  . Intellectual disability [F79] 01/10/2012   Total Time spent with patient: 30 minutes  Past Psychiatric History: see H&P  Past Medical History:  Past Medical History:  Diagnosis Date  . Bipolar 1 disorder (HCC)   . Intellectual disability   . Mental retardation   . Schizophrenia, schizo-affective (HCC)   . Seizures (HCC)    History reviewed. No pertinent surgical history. Family History: History reviewed. No pertinent family history. Family Psychiatric  History: see H&P Social History:  Social History   Substance and Sexual Activity  Alcohol Use No     Social History   Substance and Sexual Activity  Drug Use No    Social History   Socioeconomic History  . Marital status: Single    Spouse name: None  . Number of children: None  . Years of education: None  . Highest education level: None  Social Needs  . Financial resource strain: None  . Food insecurity - worry: None  . Food insecurity - inability: None  . Transportation needs - medical: None  . Transportation needs - non-medical: None  Occupational History  . None  Tobacco Use  . Smoking status: Never Smoker  . Smokeless tobacco: Never Used  Substance and Sexual Activity  . Alcohol use: No  . Drug use: No  . Sexual activity: Not Currently  Other Topics Concern  . None  Social History Narrative  . None   Additional Social History:                         Sleep: Good  Appetite:  Good  Current Medications: Current Facility-Administered Medications  Medication Dose Route  Frequency Provider Last Rate Last Dose  . acetaminophen (TYLENOL) tablet 650 mg  650 mg Oral Q4H PRN Charm RingsLord, Jamison Y, NP      . alum & mag hydroxide-simeth (MAALOX/MYLANTA) 200-200-20 MG/5ML suspension 30 mL  30 mL Oral Q4H PRN Charm RingsLord, Jamison Y, NP      . Melene Muller[START ON 02/08/2017] cloZAPine (CLOZARIL) tablet 100 mg  100 mg Oral Daily Micheal Likensainville, Anadelia Kintz T, MD      . cloZAPine (CLOZARIL) tablet 300 mg  300 mg Oral  QHS Micheal Likensainville, Addaline Peplinski T, MD   300 mg at 02/05/17 2002  . divalproex (DEPAKOTE) DR tablet 2,000 mg  2,000 mg Oral QHS Armandina StammerNwoko, Agnes I, NP   2,000 mg at 02/05/17 2002  . docusate sodium (COLACE) capsule 100 mg  100 mg Oral Daily PRN Charm RingsLord, Jamison Y, NP      . gabapentin (NEURONTIN) capsule 300 mg  300 mg Oral TID Micheal Likensainville, Harmoni Lucus T, MD      . hydrOXYzine (ATARAX/VISTARIL) tablet 50 mg  50 mg Oral Q6H PRN Micheal Likensainville, Raylin Winer T, MD   50 mg at 02/05/17 2003  . lithium carbonate (ESKALITH) CR tablet 900 mg  900 mg Oral QHS Charm RingsLord, Jamison Y, NP   900 mg at 02/05/17 2001  . LORazepam (ATIVAN) tablet 1 mg  1 mg Oral Q6H PRN Oneta RackLewis, Tanika N, NP   1 mg at 02/05/17 1912  . magnesium hydroxide (MILK OF MAGNESIA) suspension 30 mL  30 mL Oral Daily PRN Charm RingsLord, Jamison Y, NP      . magnesium oxide (MAG-OX) tablet 400 mg  400 mg Oral Daily Charm RingsLord, Jamison Y, NP   400 mg at 02/07/17 0751  . OLANZapine (ZYPREXA) tablet 10 mg  10 mg Oral Q8H PRN Micheal Likensainville, Tarius Stangelo T, MD   10 mg at 02/05/17 [id number]    Lab Results: No results found for this or any previous visit (from the past 48 hour(s)).  Blood Alcohol level:  Lab Results  Component Value Date   ETH <10 01/27/2017   ETH <5 03/07/2015    Metabolic Disorder Labs: Lab Results  Component Value Date   HGBA1C 5.0 01/31/2017   MPG 96.8 01/31/2017   Lab Results  Component Value Date   PROLACTIN 10.4 01/31/2017   Lab Results  Component Value Date   CHOL 128 01/31/2017   TRIG 105 01/31/2017   HDL 47 01/31/2017   CHOLHDL 2.7 01/31/2017   VLDL 21 01/31/2017   LDLCALC 60 01/31/2017    Physical Findings: AIMS: Facial and Oral Movements Muscles of Facial Expression: None, normal Lips and Perioral Area: None, normal Jaw: None, normal Tongue: None, normal,Extremity Movements Upper (arms, wrists, hands, fingers): None, normal Lower (legs, knees, ankles, toes): None, normal, Trunk Movements Neck, shoulders, hips: None, normal, Overall  Severity Severity of abnormal movements (highest score from questions above): None, normal Incapacitation due to abnormal movements: None, normal Patient's awareness of abnormal movements (rate only patient's report): No Awareness, Dental Status Current problems with teeth and/or dentures?: No Does patient usually wear dentures?: No  CIWA:    COWS:     Musculoskeletal: Strength & Muscle Tone: within normal limits Gait & Station: normal Patient leans: N/A  Psychiatric Specialty Exam: Physical Exam  Nursing note and vitals reviewed.   Review of Systems  Constitutional: Negative for chills and fever.  Respiratory: Negative for cough.   Cardiovascular: Negative for chest pain.  Gastrointestinal: Negative for heartburn, nausea and vomiting.  Psychiatric/Behavioral: Negative for depression and suicidal ideas. The patient  is not nervous/anxious.     Blood pressure 111/84, pulse 80, temperature 97.6 F (36.4 C), temperature source Oral, resp. rate 20, height 5\' 11"  (1.803 m), weight 81.6 kg (180 lb), SpO2 99 %.Body mass index is 25.1 kg/m.  General Appearance: Casual and Fairly Groomed  Eye Contact:  Good  Speech:  Pressured  Volume:  Increased  Mood:  Euphoric and Irritable  Affect:  Blunt, Flat and Labile  Thought Process:  Disorganized and Descriptions of Associations: Loose  Orientation:  Full (Time, Place, and Person)  Thought Content:  Illogical, Delusions, Hallucinations: Auditory Visual, Ideas of Reference:   Paranoia Delusions, Paranoid Ideation and Rumination  Suicidal Thoughts:  No  Homicidal Thoughts:  No  Memory:  Immediate;   Good Recent;   Good Remote;   Good  Judgement:  Fair  Insight:  Fair  Psychomotor Activity:  Normal  Concentration:  Concentration: Good  Recall:  Good  Fund of Knowledge:  Good  Language:  Good  Akathisia:  No  Handed:    AIMS (if indicated):     Assets:  Communication Skills Leisure Time Physical Health Resilience  ADL's:  Intact   Cognition:  WNL  Sleep:  Number of Hours: 6.75     Treatment Plan Summary: Daily contact with patient to assess and evaluate symptoms and progress in treatment and Medication management. Pt continues to be pressured, labile, and religiously preoccupied. He has episodes of agitation and poor boundaries, and his group home staff states that he appears to still be having manic symptoms compared to his baseline. We will increase dose of clozapine and add in gabapentin today.  -Continue inpatient hospitalization  - Schizoaffective disorder bipolar type - Change clozapine 50mg  qDay + 300mg  qhs to clozapine 100mg  qDay +300mg  qhs - Continue lithium 900mg  qhs (level 0.93 on 11/24) - Continuedepakote 2000mg  qhs (level 107 on 11/29)   - start gabapentin 300mg  TID -agitation    - Continue ativan 1mg  q6h prn anxiety/agitation    - Continue zyprexa 10mg  q8h prn agitation/psychosis  - Encourage participation in groups and the therapeutic milieu  - Discharge planning will be ongoing     Micheal Likenshristopher T Halimah Bewick, MD 02/07/2017, 12:58 PM

## 2017-02-07 NOTE — Progress Notes (Signed)
Adult Psychoeducational Group Note  Date:  02/07/2017 Time:  8:45 PM  Group Topic/Focus:  Wrap-Up Group:   The focus of this group is to help patients review their daily goal of treatment and discuss progress on daily workbooks.  Participation Level:  Active  Participation Quality:  Appropriate  Affect:  Appropriate  Cognitive:  Appropriate  Insight: Appropriate  Engagement in Group:  Engaged  Modes of Intervention:  Discussion  Additional Comments:  The patient expressed that he attended Coping Skill Group.The patient also said that she rates today a 10.  Octavio Mannshigpen, Brindley Madarang Lee 02/07/2017, 8:45 PM

## 2017-02-07 NOTE — Progress Notes (Signed)
DAR NOTE: Patient presents with labile affect and irritable mood.  Remained hyper verbal and religiously preoccupied.  Observed talking to self in theDenies pain, auditory and visual hallucinations.  Rates depression at 0, hopelessness at 10, and anxiety at 10.  Maintained on routine safety checks.  Medications given as prescribed.  Support and encouragement offered as needed.  Attended group and participated.  States goal for today is "keep it straight."  Patient observed socializing with peers in the dayroom.  Offered no complaint.

## 2017-02-08 MED ORDER — CLOZAPINE 100 MG PO TABS
100.0000 mg | ORAL_TABLET | Freq: Two times a day (BID) | ORAL | Status: DC
Start: 2017-02-08 — End: 2017-02-20
  Administered 2017-02-08 – 2017-02-20 (×25): 100 mg via ORAL
  Filled 2017-02-08 (×28): qty 1

## 2017-02-08 NOTE — Progress Notes (Signed)
[facility]  02/08/2017 1:06 PM Jack [name]  MRN:  [id number] Subjective:   Jack [name] is a 31 y/o with history of schizoaffective disorder bipolar type and intellectual disability who was admitted with worsening symptoms of psychosis andsymptoms of mania. Pt has grandiose delusions that he is a "reverend"selectedby God, and he willinherit a church. Pt also has belief that he is able to communicate with people's spirits, and he has been responding to internal stimuli and having conversations with himself while on the unit. He was restarted on previous medications of depakote, lithium, and clozapine with doses increased, and he has been monitored on the inpatient psychiatry unit.  Today upon evaluation, pt appears calm for the interview, but he was observed pacing earlier in the day. When asked how he is doing, pt replies, "They ain't gonna kill me, so I'm doing good." Pt was asked about who he thinks is going to kill him, and he did not reply directly, only stating, "I'm good, I'm good." Pt denies AH, and when asked about VH, he replied, "That's my business." He denies SI/HI.  He denies other concerns at time of evaluation. Discussed with patient that we are continuing to adjust his medications to address his episodes of agitation, so we will change his clozapine dosing to three times per day with largest dose at bedtime. Pt had no further questions, comments, or concerns.    Principal Problem: Schizoaffective disorder, bipolar type (HCC) Diagnosis:   Patient Active Problem List   Diagnosis Date Noted  . Bipolar affective disorder, manic, severe (HCC) [F31.13] 01/29/2017  . Agitation [R45.1] 03/09/2015  . Bipolar 1 disorder, manic, moderate (HCC) [F31.12] 03/08/2015  . Schizoaffective disorder, bipolar type (HCC) [F25.0] 01/10/2012  . Intellectual disability [F79] 01/10/2012   Total Time spent with patient: 30 minutes  Past Psychiatric History: see H&P  Past Medical History:   Past Medical History:  Diagnosis Date  . Bipolar 1 disorder (HCC)   . Intellectual disability   . Mental retardation   . Schizophrenia, schizo-affective (HCC)   . Seizures (HCC)    History reviewed. No pertinent surgical history. Family History: History reviewed. No pertinent family history. Family Psychiatric  History: see H&P Social History:  Social History   Substance and Sexual Activity  Alcohol Use No     Social History   Substance and Sexual Activity  Drug Use No    Social History   Socioeconomic History  . Marital status: Single    Spouse name: None  . Number of children: None  . Years of education: None  . Highest education level: None  Social Needs  . Financial resource strain: None  . Food insecurity - worry: None  . Food insecurity - inability: None  . Transportation needs - medical: None  . Transportation needs - non-medical: None  Occupational History  . None  Tobacco Use  . Smoking status: Never Smoker  . Smokeless tobacco: Never Used  Substance and Sexual Activity  . Alcohol use: No  . Drug use: No  . Sexual activity: Not Currently  Other Topics Concern  . None  Social History Narrative  . None   Additional Social History:                         Sleep: Good  Appetite:  Good  Current Medications: Current Facility-Administered Medications  Medication Dose Route Frequency Provider Last Rate Last Dose  . acetaminophen (TYLENOL) tablet 650 mg  650 mg Oral Q4H PRN Charm Rings, NP      . alum & mag hydroxide-simeth (MAALOX/MYLANTA) 200-200-20 MG/5ML suspension 30 mL  30 mL Oral Q4H PRN Charm Rings, NP      . cloZAPine (CLOZARIL) tablet 100 mg  100 mg Oral Daily Micheal Likens, MD   100 mg at 02/08/17 1109  . cloZAPine (CLOZARIL) tablet 300 mg  300 mg Oral QHS Micheal Likens, MD   300 mg at 02/07/17 2055  . divalproex (DEPAKOTE) DR tablet 2,000 mg  2,000 mg Oral QHS Nwoko, Agnes I, NP   2,000 mg at 02/07/17  2056  . docusate sodium (COLACE) capsule 100 mg  100 mg Oral Daily PRN Charm Rings, NP      . gabapentin (NEURONTIN) capsule 300 mg  300 mg Oral TID Micheal Likens, MD   300 mg at 02/08/17 1109  . hydrOXYzine (ATARAX/VISTARIL) tablet 50 mg  50 mg Oral Q6H PRN Micheal Likens, MD   50 mg at 02/05/17 2003  . lithium carbonate (ESKALITH) CR tablet 900 mg  900 mg Oral QHS Charm Rings, NP   900 mg at 02/07/17 2055  . LORazepam (ATIVAN) tablet 1 mg  1 mg Oral Q6H PRN Oneta Rack, NP   1 mg at 02/07/17 1658  . magnesium hydroxide (MILK OF MAGNESIA) suspension 30 mL  30 mL Oral Daily PRN Charm Rings, NP      . magnesium oxide (MAG-OX) tablet 400 mg  400 mg Oral Daily Charm Rings, NP   400 mg at 02/08/17 1108  . OLANZapine (ZYPREXA) tablet 10 mg  10 mg Oral Q8H PRN Micheal Likens, MD   10 mg at 02/05/17 1610    Lab Results: No results found for this or any previous visit (from the past 48 hour(s)).  Blood Alcohol level:  Lab Results  Component Value Date   ETH <10 01/27/2017   ETH <5 03/07/2015    Metabolic Disorder Labs: Lab Results  Component Value Date   HGBA1C 5.0 01/31/2017   MPG 96.8 01/31/2017   Lab Results  Component Value Date   PROLACTIN 10.4 01/31/2017   Lab Results  Component Value Date   CHOL 128 01/31/2017   TRIG 105 01/31/2017   HDL 47 01/31/2017   CHOLHDL 2.7 01/31/2017   VLDL 21 01/31/2017   LDLCALC 60 01/31/2017    Physical Findings: AIMS: Facial and Oral Movements Muscles of Facial Expression: None, normal Lips and Perioral Area: None, normal Jaw: None, normal Tongue: None, normal,Extremity Movements Upper (arms, wrists, hands, fingers): None, normal Lower (legs, knees, ankles, toes): None, normal, Trunk Movements Neck, shoulders, hips: None, normal, Overall Severity Severity of abnormal movements (highest score from questions above): None, normal Incapacitation due to abnormal movements: None,  normal Patient's awareness of abnormal movements (rate only patient's report): No Awareness, Dental Status Current problems with teeth and/or dentures?: No Does patient usually wear dentures?: No  CIWA:    COWS:     Musculoskeletal: Strength & Muscle Tone: within normal limits Gait & Station: normal Patient leans: N/A  Psychiatric Specialty Exam: Physical Exam  Nursing note and vitals reviewed.   Review of Systems  Constitutional: Negative for chills and fever.  Respiratory: Negative for cough.   Gastrointestinal: Negative for heartburn and nausea.  Psychiatric/Behavioral: Positive for hallucinations. Negative for depression and suicidal ideas.    Blood pressure 111/84, pulse 80, temperature 97.6 F (36.4 C), temperature source Oral, resp.  rate 20, height 5\' 11"  (1.803 m), weight 81.6 kg (180 lb), SpO2 99 %.Body mass index is 25.1 kg/m.  General Appearance: Casual and Fairly Groomed  Eye Contact:  Good  Speech:  Clear and Coherent and Normal Rate  Volume:  Normal  Mood:  Euthymic  Affect:  Blunt and Labile  Thought Process:  Coherent and Disorganized  Orientation:  Full (Time, Place, and Person)  Thought Content:  Illogical, Delusions and Hallucinations: Visual  Suicidal Thoughts:  No  Homicidal Thoughts:  No  Memory:  Immediate;   Good Recent;   Good Remote;   Good  Judgement:  Impaired  Insight:  Lacking  Psychomotor Activity:  Normal  Concentration:  Concentration: Good  Recall:  Good  Fund of Knowledge:  Poor  Language:  Good  Akathisia:  No  Handed:    AIMS (if indicated):     Assets:  Communication Skills Leisure Time Physical Health Resilience  ADL's:  Intact  Cognition:  WNL  Sleep:  Number of Hours: 6.75     Treatment Plan Summary: Daily contact with patient to assess and evaluate symptoms and progress in treatment and Medication management. Pt continues to be delusional, grandiose, religiously preoccupied, and having episodes of agitation. We will  change clozapine dosing to TID to address his afternoon/early evening symptoms.  -Continue inpatient hospitalization  - Schizoaffective disorder bipolar type - Change clozapine 100mg  qDay +300mg  qhs to clozapine 100mg  qAM + 100mg  qAfternoon (at 17:00) + 300mg  qhs - Continue lithium 900mg  qhs (level 0.93 on 11/24) - Continuedepakote 2000mg  qhs (level 107 on 11/29)             - continue gabapentin 300mg  TID -agitation                         - Continue ativan 1mg  q6h prn anxiety/agitation                         - Continue haldol 5mg  q8h prn agitation (or geodon 20mg  IM q12h prn agitation if pt refuses oral medications)  - Encourage participation in groups and the therapeutic milieu  - Discharge planning will be ongoing    Micheal Likenshristopher T Habeeb Puertas, MD 02/08/2017, 1:06 PM

## 2017-02-08 NOTE — Progress Notes (Signed)
Recreation Therapy Notes  Date: 02/08/17 Time: 1000 Location: 500 Hall Dayroom  Group Topic: Anger Management  Goal Area(s) Addresses:  Patient will identify triggers for anger.  Patient will identify physical reaction to anger.   Patient will identify benefit of using coping skills when angry.  Intervention: Worksheet  Activity: The [employer]rella Emotion.  Patients were given a worksheet with an empty umbrella.  Patients were to identify and write in the umbrella the emotions and situations that can cause anger.  Patients were to then write at least 5 coping skills on the outside of the umbrella they could use to deal with their anger.  Education: Anger Management, Discharge Planning   Education Outcome: Acknowledges education/In group clarification offered/Needs additional education.   Clinical Observations/Feedback:  Pt did not attend group.   Caroll RancherMarjette Trust Crago, LRT/CTRS          Caroll RancherLindsay, Aamna Mallozzi A 02/08/2017 12:59 PM

## 2017-02-08 NOTE — Progress Notes (Signed)
D: Writer observed pt pacing in the hall, speaking to himself and whoever walks by him. When asked about his day pt stated, "blessed, sealed and highly favored". At times during the conversation, pt was inaudible, and was speaking using different voices as if he has another personality. Pt was loud, but friendly towards staff and peers. Pt has no questions or concerns.    A:  Support and encouragement was offered. 15 min checks continued for safety.  R: Pt remains safe.

## 2017-02-08 NOTE — Progress Notes (Signed)
DAR NOTE: Patient presents with bright affect and jovial mood. Pt spent most of the am sleeping, woke up for lunch and has been up since then. Pt has not seen preaching on the hallway today, has been in the day room watching TV with peers. Denies pain, but has been observed responding to internal stimuli.  Rates depression at 4, hopelessness at 4, and anxiety at 4.  Maintained on routine safety checks.  Medications given as prescribed.  Support and encouragement offered as needed.  Attended group and participated. Patient observed socializing with peers in the dayroom.  Offered no complaint.

## 2017-02-09 LAB — CBC WITH DIFFERENTIAL/PLATELET
BASOS ABS: 0 10*3/uL (ref 0.0–0.1)
Basophils Relative: 0 %
EOS PCT: 3 %
Eosinophils Absolute: 0.2 10*3/uL (ref 0.0–0.7)
HEMATOCRIT: 35.8 % — AB (ref 39.0–52.0)
Hemoglobin: 11.7 g/dL — ABNORMAL LOW (ref 13.0–17.0)
LYMPHS ABS: 2.6 10*3/uL (ref 0.7–4.0)
LYMPHS PCT: 39 %
MCH: 29.8 pg (ref 26.0–34.0)
MCHC: 32.7 g/dL (ref 30.0–36.0)
MCV: 91.3 fL (ref 78.0–100.0)
MONO ABS: 0.5 10*3/uL (ref 0.1–1.0)
MONOS PCT: 7 %
NEUTROS ABS: 3.4 10*3/uL (ref 1.7–7.7)
Neutrophils Relative %: 51 %
Platelets: 206 10*3/uL (ref 150–400)
RBC: 3.92 MIL/uL — ABNORMAL LOW (ref 4.22–5.81)
RDW: 13.6 % (ref 11.5–15.5)
WBC: 6.7 10*3/uL (ref 4.0–10.5)

## 2017-02-09 NOTE — Progress Notes (Signed)
Adult Psychoeducational Group Note  Date:  02/09/2017 Time:  8:45 PM  Group Topic/Focus:  Wrap-Up Group:   The focus of this group is to help patients review their daily goal of treatment and discuss progress on daily workbooks.  Participation Level:  Active  Participation Quality:  Appropriate  Affect:  Appropriate  Cognitive:  Appropriate  Insight: Appropriate  Engagement in Group:  Engaged  Modes of Intervention:  Discussion  Additional Comments:  The patient expressed that he rates today a 10.The patient also said that he went to group.  Octavio Mannshigpen, Dannya Pitkin Lee 02/09/2017, 8:45 PM

## 2017-02-09 NOTE — Progress Notes (Signed)
Nursing Progress Note 1900-0730  D) Patient presents with animated affect and anxious behavior. Patient continues to respond internally to "the holy spirit". Patient responded well to PRN ativan and took scheduled medications without incident. Patient attended group this evening and was up in the dayroom.Patient denies SI/HI or pain. Patient contracts for safety on the unit.  A) Emotional support given. 1:1 interaction and active listening provided. Patient medicated as prescribed. Medications and plan of care reviewed with patient. Patient verbalized understanding without further questions. Snacks and fluids provided. Opportunities for questions or concerns presented to patient. Patient encouraged to continue to work on treatment goals. Labs, vital signs and patient behavior monitored throughout shift. Patient safety maintained with q15 min safety checks. Low fall risk precautions in place and reviewed with patient; patient verbalized understanding.  R) Patient receptive to interaction with nurse. Patient remains safe on the unit at this time. Patient denies any adverse medication reactions at this time. Patient is resting in bed without complaints. Will continue to monitor.

## 2017-02-09 NOTE — Progress Notes (Signed)
Recreation Therapy Notes  Date: 02/09/17 Time: 1000 Location: 500 Hall Dayroom  Group Topic: Self-Esteem  Goal Area(s) Addresses:  Patient will successfully identify positive attributes about themselves.  Patient will successfully identify benefit of improved self-esteem.   Intervention: Construction paper, markers  Activity: Self Advertisement.  Patients were to create an advertisement that describes and highlights the positive traits and qualities about themselves.  Education: Self-Esteem, Discharge Planning.   Education Outcome: Acknowledges education/In group clarification offered/Needs additional education  Clinical Observations/Feedback: Pt did not attend group.    Baltazar Pekala, LRT/CTRS         Dachelle Molzahn A 02/09/2017 12:06 PM 

## 2017-02-09 NOTE — Progress Notes (Signed)
Patient approached nurses's station appearing anxious and agitated. Patient is observed responding to internal stimuli and states, "Jillyn HiddenGary don't freak out now, don't freak out. Holy spirit..." Patient requests Gatorade from Clinical research associatewriter. Patient provided with scheduled medications early and PRN ativan 1 mg. Patient verbally agrees to take medications. Will continue to monitor and assess.

## 2017-02-09 NOTE — Progress Notes (Signed)
Nursing Progress Note 1900-0730  D) Patient approached nurse's station this evening and states, "the holy spirit is thirsty and says he would like some water". Patient is responding internally to stimuli and talking to the holy spirit. Patient asks the holy spirit if he would like ginger ale. Patient compliant with scheduled medications and provided PRN medications for increased agitation. Patient provided medications early and without incident. Patient attended group this evening but was minimally interactive.  A) Emotional support given. 1:1 interaction and active listening provided. Patient medicated as prescribed. Medications and plan of care reviewed with patient. Patient verbalized understanding without further questions. Snacks and fluids provided. Opportunities for questions or concerns presented to patient. Patient encouraged to continue to work on treatment goals. Labs, vital signs and patient behavior monitored throughout shift. Patient safety maintained with q15 min safety checks. Low fall risk precautions in place and reviewed with patient; patient verbalized understanding.  R) Patient receptive to interaction with nurse. Patient remains safe on the unit at this time. Patient denies any adverse medication reactions at this time. Patient is resting in bed without complaints. Will continue to monitor.

## 2017-02-09 NOTE — Progress Notes (Signed)
DAR NOTE: Patient presents with flat  affect and calm mood. Pt has had period where she was very loud and hyper religious. Denies pain, but displays auditory and visual hallucinations.  Rates depression at 5, hopelessness at 5, and anxiety at 5.  Maintained on routine safety checks.  Medications given as prescribed.  Support and encouragement offered as needed. Patient observed socializing with peers in the dayroom, will continue to monitor.

## 2017-02-09 NOTE — Progress Notes (Signed)
[facility] MD Progress Note  02/09/2017 12:28 PM Tomasa HoseGary T Duggar  MRN:  [id number] Subjective:   Earley BrookeGary Reardon is a 31 y/o with history of schizoaffective disorder bipolar type and intellectual disability who was admitted with worsening symptoms of psychosis andsymptoms of mania. Pt has grandiose delusional structure that he is a "reverend"selectedby God, and he willinherit a church. Pt also has belief that he is able to communicate with people's spirits, and he has been responding to internal stimuli and having conversations with himself while on the unit.He was restarted on previous medications of depakote, lithium, and clozapine with doses increased, and he has been monitored on the inpatient psychiatry unit. Yesterday, dose of clozapine was increased and pt was changed to TID dosing to address increasing activating behaviors/agitation in the afternoon.  Today upon evaluation, pt is cooperative and calm. Pt was asked about his mood and he replied, "I'm calm." When asked about AH he replies, "That's between me and God." He denies SI/HI/VH. He is sleeping well and his appetite is good. He reports that his medications are helpful and he does not feel that the staff is poisoning him as he previously had stated. Pt does not appear to respond to internal stimuli during the interview which is an improvement for him. Discussed with patient that we will continue his current treatment regimen and plan to have his group home staff evaluate him again for possible return to his group home, and pt was in agreement with that plan. He had no further questions, comments, or concerns.     Principal Problem: Schizoaffective disorder, bipolar type (HCC) Diagnosis:   Patient Active Problem List   Diagnosis Date Noted  . Bipolar affective disorder, manic, severe (HCC) [F31.13] 01/29/2017  . Agitation [R45.1] 03/09/2015  . Bipolar 1 disorder, manic, moderate (HCC) [F31.12] 03/08/2015  . Schizoaffective disorder, bipolar  type (HCC) [F25.0] 01/10/2012  . Intellectual disability [F79] 01/10/2012   Total Time spent with patient: 30 minutes  Past Psychiatric History: see H&P  Past Medical History:  Past Medical History:  Diagnosis Date  . Bipolar 1 disorder (HCC)   . Intellectual disability   . Mental retardation   . Schizophrenia, schizo-affective (HCC)   . Seizures (HCC)    History reviewed. No pertinent surgical history. Family History: History reviewed. No pertinent family history. Family Psychiatric  History: see H&P Social History:  Social History   Substance and Sexual Activity  Alcohol Use No     Social History   Substance and Sexual Activity  Drug Use No    Social History   Socioeconomic History  . Marital status: Single    Spouse name: None  . Number of children: None  . Years of education: None  . Highest education level: None  Social Needs  . Financial resource strain: None  . Food insecurity - worry: None  . Food insecurity - inability: None  . Transportation needs - medical: None  . Transportation needs - non-medical: None  Occupational History  . None  Tobacco Use  . Smoking status: Never Smoker  . Smokeless tobacco: Never Used  Substance and Sexual Activity  . Alcohol use: No  . Drug use: No  . Sexual activity: Not Currently  Other Topics Concern  . None  Social History Narrative  . None   Additional Social History:                         Sleep: Good  Appetite:  Good  Current Medications: Current Facility-Administered Medications  Medication Dose Route Frequency Provider Last Rate Last Dose  . acetaminophen (TYLENOL) tablet 650 mg  650 mg Oral Q4H PRN Charm Rings, NP      . alum & mag hydroxide-simeth (MAALOX/MYLANTA) 200-200-20 MG/5ML suspension 30 mL  30 mL Oral Q4H PRN Charm Rings, NP      . cloZAPine (CLOZARIL) tablet 100 mg  100 mg Oral BID Micheal Likens, MD   100 mg at 02/09/17 0805  . cloZAPine (CLOZARIL) tablet 300  mg  300 mg Oral QHS Micheal Likens, MD   300 mg at 02/08/17 2000  . divalproex (DEPAKOTE) DR tablet 2,000 mg  2,000 mg Oral QHS Nwoko, Agnes I, NP   2,000 mg at 02/08/17 2000  . docusate sodium (COLACE) capsule 100 mg  100 mg Oral Daily PRN Charm Rings, NP      . gabapentin (NEURONTIN) capsule 300 mg  300 mg Oral TID Micheal Likens, MD   300 mg at 02/09/17 1126  . hydrOXYzine (ATARAX/VISTARIL) tablet 50 mg  50 mg Oral Q6H PRN Micheal Likens, MD   50 mg at 02/05/17 2003  . lithium carbonate (ESKALITH) CR tablet 900 mg  900 mg Oral QHS Charm Rings, NP   900 mg at 02/08/17 2000  . LORazepam (ATIVAN) tablet 1 mg  1 mg Oral Q6H PRN Oneta Rack, NP   1 mg at 02/08/17 2000  . magnesium hydroxide (MILK OF MAGNESIA) suspension 30 mL  30 mL Oral Daily PRN Charm Rings, NP      . magnesium oxide (MAG-OX) tablet 400 mg  400 mg Oral Daily Charm Rings, NP   400 mg at 02/09/17 0805  . OLANZapine (ZYPREXA) tablet 10 mg  10 mg Oral Q8H PRN Micheal Likens, MD   10 mg at 02/05/17 0759    Lab Results:  Results for orders placed or performed during the hospital encounter of 01/29/17 (from the past 48 hour(s))  CBC with Differential/Platelet     Status: Abnormal   Collection Time: 02/09/17  6:36 AM  Result Value Ref Range   WBC 6.7 4.0 - 10.5 K/uL   RBC 3.92 (L) 4.22 - 5.81 MIL/uL   Hemoglobin 11.7 (L) 13.0 - 17.0 g/dL   HCT 54.0 (L) 98.1 - 19.1 %   MCV 91.3 78.0 - 100.0 fL   MCH 29.8 26.0 - 34.0 pg   MCHC 32.7 30.0 - 36.0 g/dL   RDW 47.8 29.5 - 62.1 %   Platelets 206 150 - 400 K/uL   Neutrophils Relative % 51 %   Neutro Abs 3.4 1.7 - 7.7 K/uL   Lymphocytes Relative 39 %   Lymphs Abs 2.6 0.7 - 4.0 K/uL   Monocytes Relative 7 %   Monocytes Absolute 0.5 0.1 - 1.0 K/uL   Eosinophils Relative 3 %   Eosinophils Absolute 0.2 0.0 - 0.7 K/uL   Basophils Relative 0 %   Basophils Absolute 0.0 0.0 - 0.1 K/uL    Comment: Performed at [facility], [address] [place], [place] [address]    Blood Alcohol level:  Lab Results  Component Value Date   [facility] <10 01/27/2017   ETH <5 03/07/2015    Metabolic Disorder Labs: Lab Results  Component Value Date   HGBA1C 5.0 01/31/2017   MPG 96.8 01/31/2017   Lab Results  Component Value Date   PROLACTIN 10.4 01/31/2017  Lab Results  Component Value Date   CHOL 128 01/31/2017   TRIG 105 01/31/2017   HDL 47 01/31/2017   CHOLHDL 2.7 01/31/2017   VLDL 21 01/31/2017   LDLCALC 60 01/31/2017    Physical Findings: AIMS: Facial and Oral Movements Muscles of Facial Expression: None, normal Lips and Perioral Area: None, normal Jaw: None, normal Tongue: None, normal,Extremity Movements Upper (arms, wrists, hands, fingers): None, normal Lower (legs, knees, ankles, toes): None, normal, Trunk Movements Neck, shoulders, hips: None, normal, Overall Severity Severity of abnormal movements (highest score from questions above): None, normal Incapacitation due to abnormal movements: None, normal Patient's awareness of abnormal movements (rate only patient's report): No Awareness, Dental Status Current problems with teeth and/or dentures?: No Does patient usually wear dentures?: No  CIWA:    COWS:     Musculoskeletal: Strength & Muscle Tone: within normal limits Gait & Station: normal Patient leans: N/A  Psychiatric Specialty Exam: Physical Exam  Nursing note and vitals reviewed.   Review of Systems  Constitutional: Negative for chills and fever.  Respiratory: Negative for cough.   Cardiovascular: Negative for chest pain.  Gastrointestinal: Negative for abdominal pain, heartburn, nausea and vomiting.  Psychiatric/Behavioral: Positive for hallucinations. Negative for depression and suicidal ideas. The patient is not nervous/anxious.     Blood pressure 111/84, pulse 80, temperature 97.6 F (36.4 C), temperature source Oral, resp. rate 20, height  (1.803 m), weight  81.6 kg (180 lb), SpO2 99 %.Body mass index is 25.1 kg/m.  General Appearance: Casual and Fairly Groomed  Eye Contact:  Good  Speech:  Clear and Coherent and Normal Rate  Volume:  Normal  Mood:  Euthymic  Affect:  Appropriate, Congruent, Constricted and Flat  Thought Process:  Coherent and Goal Directed  Orientation:  Full (Time, Place, and Person)  Thought Content:  Delusions, Hallucinations: Auditory and Obsessions  Suicidal Thoughts:  No  Homicidal Thoughts:  No  Memory:  Immediate;   Fair Recent;   Fair Remote;   Fair  Judgement:  Impaired  Insight:  Lacking  Psychomotor Activity:  Normal  Concentration:  Concentration: Fair  Recall:  [employer] of Knowledge:  Fair  Language:  Fair  Akathisia:  No  Handed:    AIMS (if indicated):     Assets:  Manufacturing systems engineer Physical Health Resilience Social Support  ADL's:  Intact  Cognition:  WNL  Sleep:  Number of Hours: 6.75     Treatment Plan Summary: Daily contact with patient to assess and evaluate symptoms and progress in treatment and Medication management. Pt continues to have hallucinations, delusions, and religious preoccupation, but some of his pressured behaviors and psychomotor activation has improved. He is less intrusive and having less agitation. We will continue his current regimen without changes and ask his group home staff to schedule another visit to evaluate if pt is appropriate to return to group home.  -Continue inpatient hospitalization  - Schizoaffective disorder bipolar type - Continue clozapine  qAM +  qAfternoon (at 17:00) +  qhs - Continue lithium  qhs (level 0.93 on 11/24) - Continuedepakote  qhs (level 107 on 11/29) -continue gabapentin  TID -agitation - Continue ativan  q6h prn anxiety/agitation - Continue haldol  q8h prn agitation (or geodon  IM q12h  prn agitation if pt refuses oral medications)  - Encourage participation in groups and the therapeutic milieu  - Discharge planning will be ongoing   Micheal Likens, MD 02/09/2017, 12:28 PM

## 2017-02-10 DIAGNOSIS — R451 Restlessness and agitation: Secondary | ICD-10-CM

## 2017-02-10 NOTE — Progress Notes (Signed)
D: Pt denies SI/HI. Pt is pleasant and cooperative. Pt continues to be religiously preoccupied , pt stated he sees and talks to people in the spirit.    Pt was offered support and encouragement. Pt was given scheduled medications. Pt was encourage to attend groups. Q 15 minute checks were done for safety.   R:Pt attends groups and interacts well with peers and staff. Pt is taking medication. Pt has no complaints.Pt receptive to treatment and safety maintained on unit.

## 2017-02-10 NOTE — Plan of Care (Signed)
Pt has been calm on unit and safe this evening

## 2017-02-10 NOTE — Progress Notes (Signed)
[facility] MD Progress Note  02/10/2017 2:01 PM XZAIVER VAYDA  MRN:  [id number]  Subjective:Laderrick reports, "I'm blessed. I'm just meditating. Sleep is good. The medicines area okay, I guess. My heart is hearing God".  Objective: Calogero Geisen is a 31 y/o with history of schizoaffective disorder bipolar type and intellectual disability who was admitted with worsening symptoms of psychosis andsymptoms of mania. Pt has grandiose delusional structure that he is a "reverend"selectedby God, and he willinherit a church. Pt also has belief that he is able to communicate with people's spirits, and he has been responding to internal stimuli and having conversations with himself while on the unit.He was restarted on previous medications of depakote, lithium, and clozapine with doses increased, and he has been monitored on the inpatient psychiatry unit. Yesterday, dose of clozapine was increased and pt was changed to TID dosing to address increasing activating behaviors/agitation in the afternoon.  Today, 02-10-17, upon evaluation, pt is cooperative and calm. He is lying down in his bed, holding his bible. Pt was asked about his mood and he replied, "I'm blessed." When asked about AH he replies, "My heart is hearing God." He denies SI/HI/VH. He is sleeping well and his appetite is good. He reports that his medications are helpful and he does not feel that the staff is poisoning him as he previously had stated. Pt does not appear to respond to internal stimuli during the interview which is an improvement for him. Discussed with patient that we will continue his current treatment regimen and plan to have his group home staff evaluate him again for possible return to his group home, and pt was in agreement with that plan. He had no further questions, comments, or concerns.   Principal Problem: Schizoaffective disorder, bipolar type (HCC)  Diagnosis:   Patient Active Problem List   Diagnosis Date Noted  . Bipolar  affective disorder, manic, severe (HCC) [F31.13] 01/29/2017  . Agitation [R45.1] 03/09/2015  . Bipolar 1 disorder, manic, moderate (HCC) [F31.12] 03/08/2015  . Schizoaffective disorder, bipolar type (HCC) [F25.0] 01/10/2012  . Intellectual disability [F79] 01/10/2012   Total Time spent with patient: 15 minutes  Past Psychiatric History: See H&P  Past Medical History:  Past Medical History:  Diagnosis Date  . Bipolar 1 disorder (HCC)   . Intellectual disability   . Mental retardation   . Schizophrenia, schizo-affective (HCC)   . Seizures (HCC)    History reviewed. No pertinent surgical history.  Family History: History reviewed. No pertinent family history.  Family Psychiatric  History: See H&P  Social History:  Social History   Substance and Sexual Activity  Alcohol Use No     Social History   Substance and Sexual Activity  Drug Use No    Social History   Socioeconomic History  . Marital status: Single    Spouse name: None  . Number of children: None  . Years of education: None  . Highest education level: None  Social Needs  . Financial resource strain: None  . Food insecurity - worry: None  . Food insecurity - inability: None  . Transportation needs - medical: None  . Transportation needs - non-medical: None  Occupational History  . None  Tobacco Use  . Smoking status: Never Smoker  . Smokeless tobacco: Never Used  Substance and Sexual Activity  . Alcohol use: No  . Drug use: No  . Sexual activity: Not Currently  Other Topics Concern  . None  Social History Narrative  . None  Additional Social History:   Sleep: Good  Appetite:  Good  Current Medications: Current Facility-Administered Medications  Medication Dose Route Frequency Provider Last Rate Last Dose  . acetaminophen (TYLENOL) tablet 650 mg  650 mg Oral Q4H PRN Charm RingsLord, Jamison Y, NP      . alum & mag hydroxide-simeth (MAALOX/MYLANTA) 200-200-20 MG/5ML suspension 30 mL  30 mL Oral Q4H PRN  Charm RingsLord, Jamison Y, NP      . cloZAPine (CLOZARIL) tablet 100 mg  100 mg Oral BID Micheal Likensainville, [name] T, MD   100 mg at 02/10/17 0748  . cloZAPine (CLOZARIL) tablet 300 mg  300 mg Oral QHS Micheal Likens[place]ville, [name] T, MD   300 mg at 02/09/17 2032  . divalproex (DEPAKOTE) DR tablet 2,000 mg  2,000 mg Oral QHS Armandina StammerNwoko, Agnes I, NP   2,000 mg at 02/09/17 2033  . docusate sodium (COLACE) capsule 100 mg  100 mg Oral Daily PRN Charm RingsLord, Jamison Y, NP      . gabapentin (NEURONTIN) capsule 300 mg  300 mg Oral TID Micheal Likensainville, [name] T, MD   300 mg at 02/10/17 0748  . hydrOXYzine (ATARAX/VISTARIL) tablet 50 mg  50 mg Oral Q6H PRN Micheal Likens[place], [name] T, MD   50 mg at 02/09/17 1444  . lithium carbonate (ESKALITH) CR tablet 900 mg  900 mg Oral QHS Charm RingsLord, Jamison Y, NP   900 mg at 02/09/17 2033  . LORazepam (ATIVAN) tablet 1 mg  1 mg Oral Q6H PRN Oneta RackLewis, Tanika N, NP   1 mg at 02/09/17 2033  . magnesium hydroxide (MILK OF MAGNESIA) suspension 30 mL  30 mL Oral Daily PRN Charm RingsLord, Jamison Y, NP      . magnesium oxide (MAG-OX) tablet 400 mg  400 mg Oral Daily Charm RingsLord, Jamison Y, NP   400 mg at 02/10/17 0748  . OLANZapine (ZYPREXA) tablet 10 mg  10 mg Oral Q8H PRN Micheal Likensainville, [name] T, MD   10 mg at 02/05/17 0759   Lab Results:  Results for orders placed or performed during the hospital encounter of 01/29/17 (from the past 48 hour(s))  CBC with Differential/Platelet     Status: Abnormal   Collection Time: 02/09/17  6:36 AM  Result Value Ref Range   WBC 6.7 4.0 - 10.5 K/uL   RBC 3.92 (L) 4.22 - 5.81 MIL/uL   Hemoglobin 11.7 (L) 13.0 - 17.0 g/dL   HCT 84.135.8 (L) 32.439.0 - 40.152.0 %   MCV 91.3 78.0 - 100.0 fL   MCH 29.8 26.0 - 34.0 pg   MCHC 32.7 30.0 - 36.0 g/dL   RDW 02.713.6 25.311.5 - 66.415.5 %   Platelets 206 150 - 400 K/uL   Neutrophils Relative % 51 %   Neutro Abs 3.4 1.7 - 7.7 K/uL   Lymphocytes Relative 39 %   Lymphs Abs 2.6 0.7 - 4.0 K/uL   Monocytes Relative 7 %   Monocytes Absolute 0.5 0.1 - 1.0 K/uL   Eosinophils  Relative 3 %   Eosinophils Absolute 0.2 0.0 - 0.7 K/uL   Basophils Relative 0 %   Basophils Absolute 0.0 0.0 - 0.1 K/uL    Comment: Performed at [facility], [address] [place], [place] [id number]   Blood Alcohol level:  Lab Results  Component Value Date   [facility]   ETH <5 03/07/2015   Metabolic Disorder Labs: Lab Results  Component Value Date   HGBA1C 5.0 01/31/2017   MPG 96.8 01/31/2017   Lab Results  Component Value Date  PROLACTIN 10.4 01/31/2017   Lab Results  Component Value Date   CHOL 128 01/31/2017   TRIG 105 01/31/2017   HDL 47 01/31/2017   CHOLHDL 2.7 01/31/2017   VLDL 21 01/31/2017   LDLCALC 60 01/31/2017   Physical Findings: AIMS: Facial and Oral Movements Muscles of Facial Expression: None, normal Lips and Perioral Area: None, normal Jaw: None, normal Tongue: None, normal,Extremity Movements Upper (arms, wrists, hands, fingers): None, normal Lower (legs, knees, ankles, toes): None, normal, Trunk Movements Neck, shoulders, hips: None, normal, Overall Severity Severity of abnormal movements (highest score from questions above): None, normal Incapacitation due to abnormal movements: None, normal Patient's awareness of abnormal movements (rate only patient's report): No Awareness, Dental Status Current problems with teeth and/or dentures?: No Does patient usually wear dentures?: No  CIWA:    COWS:     Musculoskeletal: Strength & Muscle Tone: within normal limits Gait & Station: normal Patient leans: N/A  Psychiatric Specialty Exam: Physical Exam  Nursing note and vitals reviewed.   Review of Systems  Constitutional: Negative for chills and fever.  Respiratory: Negative for cough.   Cardiovascular: Negative for chest pain.  Gastrointestinal: Negative for abdominal pain, heartburn, nausea and vomiting.  Psychiatric/Behavioral: Positive for hallucinations. Negative for depression and suicidal ideas. The patient  is not nervous/anxious.     Blood pressure 111/71, pulse (!) 103, temperature 97.9 F (36.6 C), temperature source Oral, resp. rate 18, height 5\' 11"  (1.803 m), weight 81.6 kg (180 lb), SpO2 99 %.Body mass index is 25.1 kg/m.  General Appearance: Casual and Fairly Groomed  Eye Contact:  Good  Speech:  Clear and Coherent and Normal Rate  Volume:  Normal  Mood:  Euthymic  Affect:  Appropriate, Congruent, Constricted and Flat  Thought Process:  Coherent and Goal Directed  Orientation:  Full (Time, Place, and Person)  Thought Content:  Delusions, Hallucinations: Auditory and Obsessions  Suicidal Thoughts:  No  Homicidal Thoughts:  No  Memory:  Immediate;   Fair Recent;   Fair Remote;   Fair  Judgement:  Impaired  Insight:  Lacking  Psychomotor Activity:  Normal  Concentration:  Concentration: Fair  Recall:  [employer] of Knowledge:  Fair  Language:  Fair  Akathisia:  No  Handed:    AIMS (if indicated):     Assets:  Manufacturing systems engineer Physical Health Resilience Social Support  ADL's:  Intact  Cognition:  WNL  Sleep:  Number of Hours: 6.75   Treatment Plan Summary: Daily contact with patient to assess and evaluate symptoms and progress in treatment and Medication management. Pt continues to have hallucinations, delusions, and religious preoccupation, but some of his pressured behaviors and psychomotor activation has improved. He is less intrusive and having less agitation. We will continue his current regimen without changes and ask his group home staff to schedule another visit to evaluate if pt is appropriate to return to group home.  -Continue inpatient hospitalization.  Will continue today 02/10/2017 plan as below except where it is noted.  - Schizoaffective disorder bipolar type - Continue clozapine 100mg  qAM + 100mg  qAfternoon (at 17:00) + 300mg  qhs - Continue lithium 900mg  qhs (level 0.93 on 11/24) - Continuedepakote 2000mg  qhs  (level 107 on 11/29) -continue gabapentin 300mg  TID -agitation - Continue ativan 1mg  q6h prn anxiety/agitation - Continue haldol 5mg  q8h prn agitation (or geodon 20mg  IM q12h prn agitation if pt refuses oral medications)  - Encourage participation in groups and the therapeutic milieu  - Discharge planning will  be ongoing  Sanjuana KavaNwoko, Agnes I, NP, PMHNP, FNP-BC. 02/10/2017, 2:01 PMPatient ID: Tomasa HoseGary T Rosell, male   DOB: [birth year], 31 y.o.   MRN: [id number]

## 2017-02-10 NOTE — Progress Notes (Signed)
Recreation Therapy Notes  Date: 02/10/17 Time: 1045 Location: Gym  Group Topic: Wellness  Goal Area(s) Addresses:  Patient will define components of whole wellness. Patient will verbalize benefit of whole wellness.  Intervention: 4 rubber bases, rubber ball   Activity: Kickball.  Patients were divided into two teams.  Each team would go until they reached three outs.  The teams would then reverse fields and the other team would get a chance to go.  The team that finished with the most points, wins the game.  Education: Wellness, Discharge Planning.   Education Outcome: Acknowledges education/In group clarification offered/Needs additional education.   Clinical Observations/Feedback:  Pt did not attend group.    Dennisse Swader, LRT/CTRS         Sweta Halseth A 02/10/2017 12:06 PM 

## 2017-02-11 NOTE — Progress Notes (Signed)
D: Pt was less active today than previous days. Wasn't jumping or preaching in the hall,was able to sit quietly at times to watch tv. However, writer still found it difficult at times to follow the pt in conversation. Pt did discuss the snow and how he hadn't  "played in it for a long time". Pt has no questions or concerns.    A:  Support and encouragement was offered. 15 min checks continued for safety.  R: Pt remains safe.

## 2017-02-11 NOTE — Progress Notes (Signed)
[facility]  02/11/2017 2:44 PM Jack Jack [name]  MRN:  [id number]  Subjective:Ac reports, "It is going well today. My mood is okay. Jack [name] have not heard voices today, not today""  Objective: Jack Jack [name] is a 31 Jack [name]/o with history of schizoaffective disorder bipolar type and intellectual disability who was admitted with worsening symptoms of psychosis andsymptoms of mania. Pt has grandiose delusional structure that he is a "reverend"selectedby God, and he willinherit a church. Pt also has belief that he is able to communicate with people's spirits, and he has been responding to internal stimuli and having conversations with himself while on the unit.He was restarted on previous medications of depakote, lithium, and clozapine with doses increased, and he has been monitored on the inpatient psychiatry unit. Yesterday, dose of clozapine was increased and pt was changed to TID dosing to address increasing activating behaviors/agitation in the afternoon.  Today, 02-11-17, upon evaluation, pt is cooperative and calm. He is lying down in his bed, holding his bible. Pt was asked about his mood and he replied, "It is okay." When asked about AH he replies, "not now." He denies SI/HI/VH. He is sleeping well and his appetite is good. He reports that his medications are helpful and he does not feel that the staff is poisoning him as he previously had stated. Pt does not appear to respond to internal stimuli during the interview which is an improvement for him. Discussed with patient that we will continue his current treatment regimen and plan to have his group home staff evaluate him again for possible return to his group home, and pt was in agreement with that plan. He had no further questions, comments, or concerns.   Principal Problem: Schizoaffective disorder, bipolar type (HCC)  Diagnosis:   Patient Active Problem List   Diagnosis Date Noted  . Bipolar affective disorder, manic, severe (HCC) [F31.13]  01/29/2017  . Agitation [R45.1] 03/09/2015  . Bipolar 1 disorder, manic, moderate (HCC) [F31.12] 03/08/2015  . Schizoaffective disorder, bipolar type (HCC) [F25.0] 01/10/2012  . Intellectual disability [F79] 01/10/2012   Total Time spent with patient: 15 minutes  Past Psychiatric History: See H&P  Past Medical History:  Past Medical History:  Diagnosis Date  . Bipolar 1 disorder (HCC)   . Intellectual disability   . Mental retardation   . Schizophrenia, schizo-affective (HCC)   . Seizures (HCC)    History reviewed. No pertinent surgical history.  Family History: History reviewed. No pertinent family history.  Family Psychiatric  History: See H&P  Social History:  Social History   Substance and Sexual Activity  Alcohol Use No     Social History   Substance and Sexual Activity  Drug Use No    Social History   Socioeconomic History  . Marital status: Single    Spouse name: None  . Number of children: None  . Years of education: None  . Highest education level: None  Social Needs  . Financial resource strain: None  . Food insecurity - worry: None  . Food insecurity - inability: None  . Transportation needs - medical: None  . Transportation needs - non-medical: None  Occupational History  . None  Tobacco Use  . Smoking status: Never Smoker  . Smokeless tobacco: Never Used  Substance and Sexual Activity  . Alcohol use: No  . Drug use: No  . Sexual activity: Not Currently  Other Topics Concern  . None  Social History Narrative  . None   Additional Social  History:   Sleep: Good  Appetite:  Good  Current Medications: Current Facility-Administered Medications  Medication Dose Route Frequency Provider Last Rate Last Dose  . acetaminophen (TYLENOL) tablet 650 mg  650 mg Oral Q4H PRN Charm RingsLord, Jack Y, NP      . alum & mag hydroxide-simeth (MAALOX/MYLANTA) 200-200-20 MG/5ML suspension 30 mL  30 mL Oral Q4H PRN Charm RingsLord, Jack Y, NP      . cloZAPine (CLOZARIL)  tablet 100 mg  100 mg Oral BID Jack Jack [name], Jack T, MD   100 mg at 02/11/17 0905  . cloZAPine (CLOZARIL) tablet 300 mg  300 mg Oral QHS Jack Jack [name], Jack T, MD   300 mg at 02/10/17 2115  . divalproex (DEPAKOTE) DR tablet 2,000 mg  2,000 mg Oral QHS Armandina StammerNwoko, Jack Gowan I, NP   2,000 mg at 02/10/17 2115  . docusate sodium (COLACE) capsule 100 mg  100 mg Oral Daily PRN Charm RingsLord, Jack Y, NP      . gabapentin (NEURONTIN) capsule 300 mg  300 mg Oral TID Jack Jack [name], Jack T, MD   300 mg at 02/11/17 1259  . hydrOXYzine (ATARAX/VISTARIL) tablet 50 mg  50 mg Oral Q6H PRN Jack Jack [name], Jack T, MD   50 mg at 02/10/17 2116  . lithium carbonate (ESKALITH) CR tablet 900 mg  900 mg Oral QHS Charm RingsLord, Jack Y, NP   900 mg at 02/10/17 2119  . LORazepam (ATIVAN) tablet 1 mg  1 mg Oral Q6H PRN Oneta RackLewis, Tanika N, NP   1 mg at 02/10/17 2115  . magnesium hydroxide (MILK OF MAGNESIA) suspension 30 mL  30 mL Oral Daily PRN Charm RingsLord, Jack Y, NP      . magnesium oxide (MAG-OX) tablet 400 mg  400 mg Oral Daily Charm RingsLord, Jack Y, NP   400 mg at 02/11/17 0905  . OLANZapine (ZYPREXA) tablet 10 mg  10 mg Oral Q8H PRN Jack Jack [name], Jack T, MD   10 mg at 02/05/17 [id number]   Lab Results:  No results found for this or any previous visit (from the past 48 hour(s)). Blood Alcohol level:  Lab Results  Component Value Date   ETH <10 01/27/2017   ETH <5 03/07/2015   Metabolic Disorder Labs: Lab Results  Component Value Date   HGBA1C 5.0 01/31/2017   MPG 96.8 01/31/2017   Lab Results  Component Value Date   PROLACTIN 10.4 01/31/2017   Lab Results  Component Value Date   CHOL 128 01/31/2017   TRIG 105 01/31/2017   HDL 47 01/31/2017   CHOLHDL 2.7 01/31/2017   VLDL 21 01/31/2017   LDLCALC 60 01/31/2017   Physical Findings: AIMS: Facial and Oral Movements Muscles of Facial Expression: None, normal Lips and Perioral Area: None, normal Jaw: None, normal Tongue: None, normal,Extremity Movements Upper (arms, wrists,  hands, fingers): None, normal Lower (legs, knees, ankles, toes): None, normal, Trunk Movements Neck, shoulders, hips: None, normal, Overall Severity Severity of abnormal movements (highest score from questions above): None, normal Incapacitation due to abnormal movements: None, normal Patient's awareness of abnormal movements (rate only patient's report): No Awareness, Dental Status Current problems with teeth and/or dentures?: No Does patient usually wear dentures?: No  CIWA:    COWS:     Musculoskeletal: Strength & Muscle Tone: within normal limits Gait & Station: normal Patient leans: N/A  Psychiatric Specialty Exam: Physical Exam  Nursing note and vitals reviewed.   Review of Systems  Constitutional: Negative for chills and fever.  Respiratory: Negative for cough.   Cardiovascular: Negative for chest  pain.  Gastrointestinal: Negative for abdominal pain, heartburn, nausea and vomiting.  Psychiatric/Behavioral: Positive for hallucinations. Negative for depression and suicidal ideas. The patient is not nervous/anxious.     Blood pressure 121/88, pulse 93, temperature 97.9 F (36.6 C), temperature source Oral, resp. rate 16, height 5\' 11"  (1.803 m), weight 81.6 kg (180 lb), SpO2 99 %.Body mass index is 25.1 kg/m.  General Appearance: Casual and Fairly Groomed  Eye Contact:  Good  Speech:  Clear and Coherent and Normal Rate  Volume:  Normal  Mood:  Euthymic  Affect:  Appropriate, Congruent, Constricted and Flat  Thought Process:  Coherent and Goal Directed  Orientation:  Full (Time, Place, and Person)  Thought Content:  Delusions, Hallucinations: Auditory and Obsessions  Suicidal Thoughts:  No  Homicidal Thoughts:  No  Memory:  Immediate;   Fair Recent;   Fair Remote;   Fair  Judgement:  Impaired  Insight:  Lacking  Psychomotor Activity:  Normal  Concentration:  Concentration: Fair  Recall:  [employer] of Knowledge:  Fair  Language:  Fair  Akathisia:  No  Handed:     AIMS (if indicated):     Assets:  Manufacturing systems engineer Physical Health Resilience Social Support  ADL's:  Intact  Cognition:  WNL  Sleep:  Number of Hours: 6.75   Treatment Plan Summary: Daily contact with patient to assess and evaluate symptoms and progress in treatment and Medication management. Pt continues to have hallucinations, delusions, and religious preoccupation, but some of his pressured behaviors and psychomotor activation has improved. He is less intrusive and having less agitation. We will continue his current regimen without changes and ask his group home staff to schedule another visit to evaluate if pt is appropriate to return to group home.  -Continue inpatient hospitalization.  Will continue today 02/11/2017 plan as below except where it is noted.  - Schizoaffective disorder bipolar type - Continue clozapine 100mg  qAM + 100mg  qAfternoon (at 17:00) + 300mg  qhs - Continue lithium 900mg  qhs (level 0.93 on 11/24) - Continuedepakote 2000mg  qhs (level 107 on 11/29) -continue gabapentin 300mg  TID -agitation - Continue ativan 1mg  q6h prn anxiety/agitation - Continue haldol 5mg  q8h prn agitation (or geodon 20mg  IM q12h prn agitation if pt refuses oral medications)  - Encourage participation in groups and the therapeutic milieu  - Discharge planning will be ongoing  Sanjuana Kava, NP, PMHNP, FNP-BC. 02/11/2017, 2:44 PMPatient ID: Jack Jack [name], male   DOB: [birth year], 12 Jack [name].o.   MRN: [id number]

## 2017-02-11 NOTE — Progress Notes (Signed)
Patient with less agitation today, less pacing. Napped between meals at times. Continues to hold bible at all times and make some incomprehensible statements but speech much less rapid and pressured then previously. Continues with religiosity. Cooperative with staff and with unit routine, compliant with medications.

## 2017-02-11 NOTE — [facility] Group Notes (Signed)
  [facility] LCSW Group Therapy Note  Date/Time:  02/11/2017 11:15AM-12:00PM  Type of Therapy and Topic:  Group Therapy:  Feelings About Hospitalization  Participation Level:  Active   Description of Group This process group involved patients discussing their feelings related to being hospitalized, as well as the benefits they see to being in the hospital.  These feelings and benefits were itemized.  The group then brainstormed specific ways in which they could seek those same benefits when they discharge and return home.  Therapeutic Goals 1. Patient will identify and describe positive and negative feelings related to hospitalization 2. Patient will verbalize benefits of hospitalization to themselves personally 3. Patients will brainstorm together ways they can obtain similar benefits in the outpatient setting, identify barriers to wellness and possible solutions  Summary of Patient Progress:  The patient expressed his primary feelings about being hospitalized are that he feelings better than he did, that he is able now to stay focused on loving himself.  Therapeutic Modalities Cognitive Behavioral Therapy Motivational Interviewing    Ambrose MantleMareida Grossman-Orr, LCSW 02/11/2017, 12:52 PM  .

## 2017-02-12 NOTE — Progress Notes (Signed)
D: Pt  Responding even though he denies, pt very labile on the unit. Pt stated he was having a seizure and we needed to call the police , pt was pacing the unit getting himself agitated, pt was given PRN medications per MAR .    A: Pt was offered support and encouragement. Pt was given scheduled medications. Pt was encourage to attend groups. Q 15 minute checks were done for safety.   R: safety maintained on unit.

## 2017-02-12 NOTE — Progress Notes (Addendum)
D. Pt's behavior is much calmer today- less agitated. Pt's speech is less pressured and rapid. Pt states, "I"m calmer in the spirit today." Pt is calm and cooperative- continues to be religiously preoccupied. Per pt's self inventory, pt rates his depression, hopelessness and anxiety a 5/3/1, respectively. Pt currently denies SI/HI. A. Labs and vitals monitored. Pt compliant with medications. Pt supported emotionally and encouraged to voice concerns.   R. Pt remains safe with 15 minute checks. Will continue POC.

## 2017-02-12 NOTE — [facility] Group Notes (Signed)
[facility]  Date/Time:  02/12/2017  11:00AM-12:00PM  Type of Therapy and Topic:  Group Therapy:  Music and Mood  Participation Level:  Active   Description of Group: In this process group, members listened to a variety of genres of music and identified that different types of music evoke different responses.  Patients were encouraged to identify music that was soothing for them and music that was energizing for them.  Patients discussed how this knowledge can help with wellness and recovery in various ways including managing depression and anxiety as well as encouraging healthy sleep habits.    Therapeutic Goals: 1. Patients will explore the impact of different varieties of music on mood 2. Patients will verbalize the thoughts they have when listening to different types of music 3. Patients will identify music that is soothing to them as well as music that is energizing to them 4. Patients will discuss how to use this knowledge to assist in maintaining wellness and recovery 5. Patients will explore the use of music as a coping skill  Summary of Patient Progress:  At the beginning of group, patient expressed he was feeling like "conversating with the spirit."  He got up and danced, sang along a little, and seemed to enjoy the music.  At one point, he held a conversation with a nonexistent person next to him, covering his mouth as though telling secrets to that "person."  Therapeutic Modalities: Solution Focused Brief Therapy Motivational Interviewing Activity   Ambrose MantleMareida Grossman-Orr, LCSW 02/12/2017 11:55 AM

## 2017-02-12 NOTE — Progress Notes (Signed)
Pt pacing the unit walking shouting stating he was having a seizure, pt talking in tongues and talking to people not seen by writer , pt was given PRN Ativan and Zyprexa [facility]

## 2017-02-12 NOTE — Progress Notes (Signed)
[facility]  02/12/2017 11:32 AM Jack HoseGary T Suber  MRN:  [id number]  Subjective:Craig reports, "It is going good. My mood is good, just watching the snow".  Objective: Earley BrookeGary Niblack is a 31 y/o with history of schizoaffective disorder bipolar type and intellectual disability who was admitted with worsening symptoms of psychosis andsymptoms of mania. Pt has grandiose delusional structure that he is a "reverend"selectedby God, and he willinherit a church. Pt also has belief that he is able to communicate with people's spirits, and he has been responding to internal stimuli and having conversations with himself while on the unit.He was restarted on previous medications of depakote, lithium, and clozapine with doses increased, and he has been monitored on the inpatient psychiatry unit. Yesterday, dose of clozapine was increased and pt was changed to TID dosing to address increasing activating behaviors/agitation in the afternoon.  Today, 02-12-17, upon evaluation, pt is cooperative and calm. He is in his room, looking out of the window watching the snow. He continues to endorse improved mood. He denies SI/HI/AVH. He is sleeping well and his appetite is good. He reports that his medications are helpful and he does not report that the staff is poisoning him as he previously had stated. Pt does not appear to respond to internal stimuli during the interview which is an improvement for him. Discussed with patient that we will continue his current treatment regimen and plan to have his group home staff evaluate him again for possible return to his group home, and pt was in agreement with that plan. He had no further questions, comments, or concerns.   Principal Problem: Schizoaffective disorder, bipolar type (HCC)  Diagnosis:   Patient Active Problem List   Diagnosis Date Noted  . Bipolar affective disorder, manic, severe (HCC) [F31.13] 01/29/2017  . Agitation [R45.1] 03/09/2015  . Bipolar 1  disorder, manic, moderate (HCC) [F31.12] 03/08/2015  . Schizoaffective disorder, bipolar type (HCC) [F25.0] 01/10/2012  . Intellectual disability [F79] 01/10/2012   Total Time spent with patient: 15 minutes  Past Psychiatric History: See H&P  Past Medical History:  Past Medical History:  Diagnosis Date  . Bipolar 1 disorder (HCC)   . Intellectual disability   . Mental retardation   . Schizophrenia, schizo-affective (HCC)   . Seizures (HCC)    History reviewed. No pertinent surgical history.  Family History: History reviewed. No pertinent family history.  Family Psychiatric  History: See H&P  Social History:  Social History   Substance and Sexual Activity  Alcohol Use No     Social History   Substance and Sexual Activity  Drug Use No    Social History   Socioeconomic History  . Marital status: Single    Spouse name: None  . Number of children: None  . Years of education: None  . Highest education level: None  Social Needs  . Financial resource strain: None  . Food insecurity - worry: None  . Food insecurity - inability: None  . Transportation needs - medical: None  . Transportation needs - non-medical: None  Occupational History  . None  Tobacco Use  . Smoking status: Never Smoker  . Smokeless tobacco: Never Used  Substance and Sexual Activity  . Alcohol use: No  . Drug use: No  . Sexual activity: Not Currently  Other Topics Concern  . None  Social History Narrative  . None   Additional Social History:   Sleep: Good  Appetite:  Good  Current Medications: Current Facility-Administered Medications  Medication Dose Route Frequency Provider Last Rate Last Dose  . acetaminophen (TYLENOL) tablet 650 mg  650 mg Oral Q4H PRN Charm RingsLord, Jamison Y, NP   650 mg at 02/12/17 1049  . alum & mag hydroxide-simeth (MAALOX/MYLANTA) 200-200-20 MG/5ML suspension 30 mL  30 mL Oral Q4H PRN Charm RingsLord, Jamison Y, NP      . cloZAPine (CLOZARIL) tablet 100 mg  100 mg Oral BID  Micheal Likensainville, [name] T, MD   100 mg at 02/12/17 0723  . cloZAPine (CLOZARIL) tablet 300 mg  300 mg Oral QHS Micheal Likensainville, [name] T, MD   300 mg at 02/11/17 2136  . divalproex (DEPAKOTE) DR tablet 2,000 mg  2,000 mg Oral QHS Armandina StammerNwoko, Zenovia Justman I, NP   2,000 mg at 02/11/17 2136  . docusate sodium (COLACE) capsule 100 mg  100 mg Oral Daily PRN Charm RingsLord, Jamison Y, NP      . gabapentin (NEURONTIN) capsule 300 mg  300 mg Oral TID Micheal Likensainville, [name] T, MD   300 mg at 02/12/17 0723  . hydrOXYzine (ATARAX/VISTARIL) tablet 50 mg  50 mg Oral Q6H PRN Micheal Likensainville, [name] T, MD   50 mg at 02/10/17 2116  . lithium carbonate (ESKALITH) CR tablet 900 mg  900 mg Oral QHS Charm RingsLord, Jamison Y, NP   900 mg at 02/11/17 2136  . LORazepam (ATIVAN) tablet 1 mg  1 mg Oral Q6H PRN Oneta RackLewis, Tanika N, NP   1 mg at 02/12/17 1049  . magnesium hydroxide (MILK OF MAGNESIA) suspension 30 mL  30 mL Oral Daily PRN Charm RingsLord, Jamison Y, NP      . magnesium oxide (MAG-OX) tablet 400 mg  400 mg Oral Daily Charm RingsLord, Jamison Y, NP   400 mg at 02/12/17 [id number]  . OLANZapine (ZYPREXA) tablet 10 mg  10 mg Oral Q8H PRN Micheal Likensainville, [name] T, MD   10 mg at 02/05/17 [id number]   Lab Results:  No results found for this or any previous visit (from the past 48 hour(s)). Blood Alcohol level:  Lab Results  Component Value Date   ETH <10 01/27/2017   ETH <5 03/07/2015   Metabolic Disorder Labs: Lab Results  Component Value Date   HGBA1C 5.0 01/31/2017   MPG 96.8 01/31/2017   Lab Results  Component Value Date   PROLACTIN 10.4 01/31/2017   Lab Results  Component Value Date   CHOL 128 01/31/2017   TRIG 105 01/31/2017   HDL 47 01/31/2017   CHOLHDL 2.7 01/31/2017   VLDL 21 01/31/2017   LDLCALC 60 01/31/2017   Physical Findings: AIMS: Facial and Oral Movements Muscles of Facial Expression: None, normal Lips and Perioral Area: None, normal Jaw: None, normal Tongue: None, normal,Extremity Movements Upper (arms, wrists, hands, fingers): None,  normal Lower (legs, knees, ankles, toes): None, normal, Trunk Movements Neck, shoulders, hips: None, normal, Overall Severity Severity of abnormal movements (highest score from questions above): None, normal Incapacitation due to abnormal movements: None, normal Patient's awareness of abnormal movements (rate only patient's report): No Awareness, Dental Status Current problems with teeth and/or dentures?: No Does patient usually wear dentures?: No  CIWA:    COWS:     Musculoskeletal: Strength & Muscle Tone: within normal limits Gait & Station: normal Patient leans: N/A  Psychiatric Specialty Exam: Physical Exam  Nursing note and vitals reviewed.   Review of Systems  Constitutional: Negative for chills and fever.  Respiratory: Negative for cough.   Cardiovascular: Negative for chest pain.  Gastrointestinal: Negative for abdominal pain, heartburn, nausea and vomiting.  Psychiatric/Behavioral:  Positive for hallucinations. Negative for depression and suicidal ideas. The patient is not nervous/anxious.     Blood pressure 120/71, pulse (!) 106, temperature 98.5 F (36.9 C), resp. rate 16, height 5\' 11"  (1.803 m), weight 81.6 kg (180 lb), SpO2 99 %.Body mass index is 25.1 kg/m.  General Appearance: Casual and Fairly Groomed  Eye Contact:  Good  Speech:  Clear and Coherent and Normal Rate  Volume:  Normal  Mood:  Euthymic  Affect:  Appropriate, Congruent, Constricted and Flat  Thought Process:  Coherent and Goal Directed  Orientation:  Full (Time, Place, and Person)  Thought Content:  Delusions, Hallucinations: Auditory and Obsessions  Suicidal Thoughts:  No  Homicidal Thoughts:  No  Memory:  Immediate;   Fair Recent;   Fair Remote;   Fair  Judgement:  Impaired  Insight:  Lacking  Psychomotor Activity:  Normal  Concentration:  Concentration: Fair  Recall:  [employer] of Knowledge:  Fair  Language:  Fair  Akathisia:  No  Handed:    AIMS (if indicated):     Assets:   Manufacturing systems engineer Physical Health Resilience Social Support  ADL's:  Intact  Cognition:  WNL  Sleep:  Number of Hours: 6.5   Treatment Plan Summary: Daily contact with patient to assess and evaluate symptoms and progress in treatment and Medication management. Pt continues to have hallucinations, delusions, and religious preoccupation, but some of his pressured behaviors and psychomotor activation has improved. He is less intrusive and having less agitation. We will continue his current regimen without changes and ask his group home staff to schedule another visit to evaluate if pt is appropriate to return to group home.  -Continue inpatient hospitalization.  Will continue today 02/12/2017 plan as below except where it is noted.  - Schizoaffective disorder bipolar type - Continue clozapine 100mg  qAM + 100mg  qAfternoon (at 17:00) + 300mg  qhs - Continue lithium 900mg  qhs (level 0.93 on 11/24) - Continuedepakote 2000 mg qhs (level 107 on 11/29) -continue gabapentin 300mg  TID -agitation - Continue ativan 1mg  q6h prn anxiety/agitation - Continue haldol 5mg  q8h prn agitation (or geodon 20mg  IM q12h prn agitation if pt refuses oral medications)  - Encourage participation in groups and the therapeutic milieu  - Discharge planning will be ongoing  Sanjuana Kava, NP, PMHNP, FNP-BC. 02/12/2017, 11:32 AMPatient ID: Jack [name], male   DOB: 09/22/1985, 31 y.o.   MRN: [id number]

## 2017-02-12 NOTE — Plan of Care (Signed)
Pt safe on the unit at this time 

## 2017-02-13 NOTE — Progress Notes (Signed)
D: Pt continues to be religiously preoccupied and talking to people not seen by Clinical research associatewriter. Pt continues to state he was about to have seizures, but pt does this every evening just before medication time .  A: Pt was offered support and encouragement. Pt was given scheduled medications. Pt was encourage to attend groups. Q 15 minute checks were done for safety.  R:Pt attends groups and interacts well with peers and staff. Pt is taking medication. Pt has no complaints.Pt receptive to treatment and safety maintained on unit.

## 2017-02-13 NOTE — Plan of Care (Signed)
  Health Behavior/Discharge Planning: Compliance with prescribed medication regimen will improve 02/13/2017 1426 - Progressing by Lenord Fellersopson, Mysty Kielty Elizabeth, RN

## 2017-02-13 NOTE — Tx Team (Signed)
Interdisciplinary Treatment and Diagnostic Plan Update  02/13/2017 Time of Session: 12:20 PM  Jack [name] MRN: [id number]  Principal Diagnosis: Schizoaffective disorder, bipolar type [facility])  Secondary Diagnoses: Principal Problem:   Schizoaffective disorder, bipolar type ([place]) Active Problems:   Intellectual disability   Current Medications:  Current Facility-Administered Medications  Medication Dose Route Frequency Provider Last Rate Last Dose  . acetaminophen (TYLENOL) tablet 650 mg  650 mg Oral Q4H PRN Patrecia Pour, NP   650 mg at 02/12/17 1854  . alum & mag hydroxide-simeth (MAALOX/MYLANTA) 200-200-20 MG/5ML suspension 30 mL  30 mL Oral Q4H PRN Patrecia Pour, NP      . cloZAPine (CLOZARIL) tablet 100 mg  100 mg Oral BID Pennelope Bracken, MD   100 mg at 02/13/17 0847  . cloZAPine (CLOZARIL) tablet 300 mg  300 mg Oral QHS Pennelope Bracken, MD   300 mg at 02/12/17 2107  . divalproex (DEPAKOTE) DR tablet 2,000 mg  2,000 mg Oral QHS Lindell Spar I, NP   2,000 mg at 02/12/17 2107  . docusate sodium (COLACE) capsule 100 mg  100 mg Oral Daily PRN Patrecia Pour, NP      . gabapentin (NEURONTIN) capsule 300 mg  300 mg Oral TID Pennelope Bracken, MD   300 mg at 02/13/17 0848  . hydrOXYzine (ATARAX/VISTARIL) tablet 50 mg  50 mg Oral Q6H PRN Pennelope Bracken, MD   50 mg at 02/10/17 2116  . lithium carbonate (ESKALITH) CR tablet 900 mg  900 mg Oral QHS Patrecia Pour, NP   900 mg at 02/12/17 2106  . LORazepam (ATIVAN) tablet 1 mg  1 mg Oral Q6H PRN Derrill Center, NP   1 mg at 02/13/17 0847  . magnesium hydroxide (MILK OF MAGNESIA) suspension 30 mL  30 mL Oral Daily PRN Patrecia Pour, NP      . magnesium oxide (MAG-OX) tablet 400 mg  400 mg Oral Daily Patrecia Pour, NP   400 mg at 02/13/17 0846  . OLANZapine (ZYPREXA) tablet 10 mg  10 mg Oral Q8H PRN Pennelope Bracken, MD   10 mg at 02/12/17 2148    PTA Medications: Medications Prior to  Admission  Medication Sig Dispense Refill Last Dose  . cloZAPine (CLOZARIL) 100 MG tablet Take 1 tablet (100 mg total) by mouth daily. (Patient not taking: Reported on 01/27/2017) 30 tablet 0 Not Taking at Unknown time  . cloZAPine (CLOZARIL) 100 MG tablet Take 3 tablets (300 mg total) by mouth at bedtime. (Patient taking differently: Take 200 mg by mouth at bedtime. ) 90 tablet 0 01/27/2017 at Unknown time  . clozapine (CLOZARIL) 50 MG tablet TK 1 T PO NIGHTLY  1 Not Taking at Unknown time  . divalproex (DEPAKOTE) 500 MG DR tablet Take 1 tablet (500 mg total) by mouth every 12 (twelve) hours. (Patient taking differently: Take 1,500 mg by mouth daily. ) 60 tablet 0 01/27/2017 at 0800  . DOK 100 MG capsule TK 1 C PO BID AS NEEDED FOR CONSTIPATION  1 unknown  . haloperidol (HALDOL) 10 MG tablet TK 1 T PO BID  1 Not Taking at Unknown time  . lithium carbonate (LITHOBID) 300 MG CR tablet Take 3 tablets (900 mg) by mouth daily  1 01/27/2017 at Unknown time  . lithium carbonate 300 MG capsule Take 3 capsules (900 mg total) by mouth at bedtime. (Patient not taking: Reported on 01/27/2017) 90 capsule 0 Not Taking at Unknown  time  . LORazepam (ATIVAN) 1 MG tablet TK 1 T PO TID  1 Not Taking at Unknown time  . magnesium oxide (MAG-OX) 400 MG tablet Take 400 mg by mouth daily.   01/27/2017 at Unknown time  . trihexyphenidyl (ARTANE) 2 MG tablet Take 1 tablet (2 mg total) by mouth 2 (two) times daily with a meal. (Patient not taking: Reported on 01/27/2017) 60 tablet 0 Completed Course at Unknown time    Patient Stressors: Health problems  Patient Strengths: Religious Affiliation  Treatment Modalities: Medication Management, Group therapy, Case management,  1 to 1 session with clinician, Psychoeducation, Recreational therapy.   Physician Treatment Plan for Primary Diagnosis: Schizoaffective disorder, bipolar type ([place]) Long Term Goal(s): Improvement in symptoms so as ready for discharge  Short Term  Goals: Ability to identify changes in lifestyle to reduce recurrence of condition will improve Ability to verbalize feelings will improve Ability to identify and develop effective coping behaviors will improve Compliance with prescribed medications will improve  Medication Management: Evaluate patient's response, side effects, and tolerance of medication regimen.  Therapeutic Interventions: 1 to 1 sessions, Unit Group sessions and Medication administration.  Evaluation of Outcomes: Progressing   12/3:  Both mother and [facility] manager report patient is more fixated on his delusion, is agitated and pacing, is paranid and accusing, and is not grounded enough to be able to successfully return to [facility].  Will adjust meds accordingly  Physician Treatment Plan for Secondary Diagnosis: Principal Problem:   Schizoaffective disorder, bipolar type ([place]) Active Problems:   Intellectual disability   Long Term Goal(s): Improvement in symptoms so as ready for discharge  Short Term Goals: Ability to identify changes in lifestyle to reduce recurrence of condition will improve Ability to verbalize feelings will improve Ability to identify and develop effective coping behaviors will improve Compliance with prescribed medications will improve  Medication Management: Evaluate patient's response, side effects, and tolerance of medication regimen.  Therapeutic Interventions: 1 to 1 sessions, Unit Group sessions and Medication administration.  Evaluation of Outcomes: Progressing   12/10: Pt continues to denies any hallucinations or delusions today, however, continues to have some religious preoccupation, but some of his pressured behaviors and psychomotor activation has improved. He is less intrusive and having less agitation. - Schizoaffective disorder bipolar type - Continue clozapine '100mg'$  qAM + '100mg'$  qAfternoon (at 17:00) + '300mg'$  qhs - Continue lithium '900mg'$  qhs (level 0.93 on  11/24) - Continuedepakote 2000 mg qhs (level 107 on 11/29) -continuegabapentin '300mg'$  TID for agitation     RN Treatment Plan for Primary Diagnosis: Schizoaffective disorder, bipolar type ([place]) Long Term Goal(s): Knowledge of disease and therapeutic regimen to maintain health will improve  Short Term Goals: Ability to identify and develop effective coping behaviors will improve and Compliance with prescribed medications will improve  Medication Management: RN will administer medications as ordered by provider, will assess and evaluate patient's response and provide education to patient for prescribed medication. RN will report any adverse and/or side effects to prescribing provider.  Therapeutic Interventions: 1 on 1 counseling sessions, Psychoeducation, Medication administration, Evaluate responses to treatment, Monitor vital signs and CBGs as ordered, Perform/monitor CIWA, COWS, AIMS and Fall Risk screenings as ordered, Perform wound care treatments as ordered.  Evaluation of Outcomes: Progressing   LCSW Treatment Plan for Primary Diagnosis: Schizoaffective disorder, bipolar type ([place]) Long Term Goal(s): Safe transition to appropriate next level of care at discharge, Engage patient in therapeutic group addressing interpersonal concerns.  Short Term Goals: Engage patient in  aftercare planning with referrals and resources  Therapeutic Interventions: Assess for all discharge needs, 1 to 1 time with Social worker, Explore available resources and support systems, Assess for adequacy in community support network, Educate family and significant other(s) on suicide prevention, Complete Psychosocial Assessment, Interpersonal group therapy.  Evaluation of Outcomes: Met  Return to [facility], follow up [facility]   Progress in Treatment: Attending groups: Yes Participating in groups: Yes Taking medication as prescribed: Yes Toleration medication: Yes, no side effects reported  at this time Family/Significant other contact made: Yes Patient understands diagnosis: No Limited insight Discussing patient identified problems/goals with staff: Yes Medical problems stabilized or resolved: Yes Denies suicidal/homicidal ideation: Yes Issues/concerns per patient self-inventory: None Other: N/A  New problem(s) identified: None identified at this time.   New Short Term/Long Term Goal(s): "I don't want to go back to the [facility].  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."  Discharge Plan or Barriers:   Reason for Continuation of Hospitalization:  Delusions   Mania  Medication stabilization   Estimated Length of Stay: 12/14  Attendees: Patient: 02/13/2017  12:20 PM  Physician: Maris Berger, MD 02/13/2017  12:20 PM  Nursing: Sena Hitch, RN 02/13/2017  12:20 PM  RN Care Manager: Lars Pinks, RN 02/13/2017  12:20 PM  Social Worker: Ripley Fraise 02/13/2017  12:20 PM  Recreational Therapist: Winfield Cunas 02/13/2017  12:20 PM  Other: Norberto Sorenson 02/13/2017  12:20 PM  Other:  02/13/2017  12:20 PM    Scribe for Treatment Team:  Roque Lias LCSW 02/13/2017 12:20 PM

## 2017-02-13 NOTE — Progress Notes (Signed)
Patient ID: Jack HoseGary T Genao, male   DOB: 12/28/1985, 31 y.o.   MRN: [id number] [facility]  02/13/2017 10:48 AM Jack HoseGary T Hodgdon  MRN:  [id number]  Subjective:Jarrick reports, "I'm doing well, just resting"  Objective: Earley BrookeGary Lasseter is a 31 y/o with history of schizoaffective disorder bipolar type and intellectual disability who was admitted with worsening symptoms of psychosis andsymptoms of mania. Pt has grandiose delusional structure that he is a "reverend"selectedby God, and he willinherit a church. Pt also has belief that he is able to communicate with people's spirits, and he has been responding to internal stimuli and having conversations with himself while on the unit.He was restarted on previous medications of depakote, lithium, and clozapine with doses increased, and he has been monitored on the inpatient psychiatry unit. Yesterday, dose of clozapine was increased and pt was changed to TID dosing to address increasing activating behaviors/agitation in the afternoon.  Today, 02-13-17, upon evaluation, pt is alert, cooperative and calm. He is in his room, lying down in bed. He continues to endorse improved mood. He denies SI/HI/AVH. He is sleeping well and his appetite is good. He reports that his medications are helpful & denies any delusional thoughts. Pt does not appear to be responding to any internal stimuli during the interview which is an improvement for him. He continues to carry his bible around with him. 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 He is attending & participating in the group sessions. Discussed with patient that we will continue his current treatment regimen and plan to have his group home staff evaluate him again for possible return to his group home, and pt was in agreement with that plan. He had no further questions, comments, or concerns. Staff continue to provide support & encouragement.  Principal Problem: Schizoaffective  disorder, bipolar type (HCC)  Diagnosis:   Patient Active Problem List   Diagnosis Date Noted  . Bipolar affective disorder, manic, severe (HCC) [F31.13] 01/29/2017  . Agitation [R45.1] 03/09/2015  . Bipolar 1 disorder, manic, moderate (HCC) [F31.12] 03/08/2015  . Schizoaffective disorder, bipolar type (HCC) [F25.0] 01/10/2012  . Intellectual disability [F79] 01/10/2012   Total Time spent with patient: 15 minutes  Past Psychiatric History: See H&P  Past Medical History:  Past Medical History:  Diagnosis Date  . Bipolar 1 disorder (HCC)   . Intellectual disability   . Mental retardation   . Schizophrenia, schizo-affective (HCC)   . Seizures (HCC)    History reviewed. No pertinent surgical history.  Family History: History reviewed. No pertinent family history.  Family Psychiatric  History: See H&P  Social History:  Social History   Substance and Sexual Activity  Alcohol Use No     Social History   Substance and Sexual Activity  Drug Use No    Social History   Socioeconomic History  . Marital status: Single    Spouse name: None  . Number of children: None  . Years of education: None  . Highest education level: None  Social Needs  . Financial resource strain: None  . Food insecurity - worry: None  . Food insecurity - inability: None  . Transportation needs - medical: None  . Transportation needs - non-medical: None  Occupational History  . None  Tobacco Use  . Smoking status: Never Smoker  . Smokeless tobacco: Never Used  Substance and Sexual Activity  . Alcohol use: No  . Drug use: No  . Sexual activity:  Not Currently  Other Topics Concern  . None  Social History Narrative  . None   Additional Social History:   Sleep: Good  Appetite:  Good  Current Medications: Current Facility-Administered Medications  Medication Dose Route Frequency Provider Last Rate Last Dose  . acetaminophen (TYLENOL) tablet 650 mg  650 mg Oral Q4H PRN Charm Rings,  NP   650 mg at 02/12/17 1854  . alum & mag hydroxide-simeth (MAALOX/MYLANTA) 200-200-20 MG/5ML suspension 30 mL  30 mL Oral Q4H PRN Charm Rings, NP      . cloZAPine (CLOZARIL) tablet 100 mg  100 mg Oral BID Micheal Likens, MD   100 mg at 02/13/17 0847  . cloZAPine (CLOZARIL) tablet 300 mg  300 mg Oral QHS Micheal Likens, MD   300 mg at 02/12/17 2107  . divalproex (DEPAKOTE) DR tablet 2,000 mg  2,000 mg Oral QHS Armandina Stammer I, NP   2,000 mg at 02/12/17 2107  . docusate sodium (COLACE) capsule 100 mg  100 mg Oral Daily PRN Charm Rings, NP      . gabapentin (NEURONTIN) capsule 300 mg  300 mg Oral TID Micheal Likens, MD   300 mg at 02/13/17 0848  . hydrOXYzine (ATARAX/VISTARIL) tablet 50 mg  50 mg Oral Q6H PRN Micheal Likens, MD   50 mg at 02/10/17 2116  . lithium carbonate (ESKALITH) CR tablet 900 mg  900 mg Oral QHS Charm Rings, NP   900 mg at 02/12/17 2106  . LORazepam (ATIVAN) tablet 1 mg  1 mg Oral Q6H PRN Oneta Rack, NP   1 mg at 02/13/17 0847  . magnesium hydroxide (MILK OF MAGNESIA) suspension 30 mL  30 mL Oral Daily PRN Charm Rings, NP      . magnesium oxide (MAG-OX) tablet 400 mg  400 mg Oral Daily Charm Rings, NP   400 mg at 02/13/17 0846  . OLANZapine (ZYPREXA) tablet 10 mg  10 mg Oral Q8H PRN Micheal Likens, MD   10 mg at 02/12/17 2148   Lab Results:  No results found for this or any previous visit (from the past 48 hour(s)). Blood Alcohol level:  Lab Results  Component Value Date   ETH <10 01/27/2017   ETH <5 03/07/2015   Metabolic Disorder Labs: Lab Results  Component Value Date   HGBA1C 5.0 01/31/2017   MPG 96.8 01/31/2017   Lab Results  Component Value Date   PROLACTIN 10.4 01/31/2017   Lab Results  Component Value Date   CHOL 128 01/31/2017   TRIG 105 01/31/2017   HDL 47 01/31/2017   CHOLHDL 2.7 01/31/2017   VLDL 21 01/31/2017   LDLCALC 60 01/31/2017   Physical Findings: AIMS: Facial  and Oral Movements Muscles of Facial Expression: None, normal Lips and Perioral Area: None, normal Jaw: None, normal Tongue: None, normal,Extremity Movements Upper (arms, wrists, hands, fingers): None, normal Lower (legs, knees, ankles, toes): None, normal, Trunk Movements Neck, shoulders, hips: None, normal, Overall Severity Severity of abnormal movements (highest score from questions above): None, normal Incapacitation due to abnormal movements: None, normal Patient's awareness of abnormal movements (rate only patient's report): No Awareness, Dental Status Current problems with teeth and/or dentures?: No Does patient usually wear dentures?: No  CIWA:    COWS:     Musculoskeletal: Strength & Muscle Tone: within normal limits Gait & Station: normal Patient leans: N/A  Psychiatric Specialty Exam: Physical Exam  Nursing note and vitals reviewed.  Neurological: A cranial nerve deficit is present.    Review of Systems  Constitutional: Negative for chills and fever.  Respiratory: Negative for cough.   Cardiovascular: Negative for chest pain.  Gastrointestinal: Negative for abdominal pain, heartburn, nausea and vomiting.  Psychiatric/Behavioral: Positive for hallucinations. Negative for depression and suicidal ideas. The patient is not nervous/anxious.     Blood pressure 120/71, pulse (!) 103, temperature 98.2 F (36.8 C), temperature source Oral, resp. rate 20, height  (1.803 m), weight 81.6 kg (180 lb), SpO2 99 %.Body mass index is 25.1 kg/m.  General Appearance: Casual and Fairly Groomed  Eye Contact:  Good  Speech:  Clear and Coherent and Normal Rate  Volume:  Normal  Mood:  Euthymic  Affect:  Appropriate and Congruent  Thought Process:  Coherent, Goal Directed and Descriptions of Associations: Intact  Orientation:  Full (Time, Place, and Person)  Thought Content:  Denies any hallucinations, delusions or paranoia today.  Suicidal Thoughts:  Denies  Homicidal Thoughts:   Denies  Memory:  Immediate;   Fair Recent;   Fair Remote;   Fair  Judgement:  Fair  Insight:  Fair and Lacking  Psychomotor Activity:  Normal  Concentration:  Concentration: Fair  Recall:  [employer] of Knowledge:  Fair  Language:  Fair  Akathisia:  No  Handed:    AIMS (if indicated):     Assets:  Manufacturing systems engineer Physical Health Resilience Social Support  ADL's:  Intact  Cognition:  WNL  Sleep:  Number of Hours: 6   Treatment Plan Summary: Daily contact with patient to assess and evaluate symptoms and progress in treatment and Medication management. Pt continues to denies any hallucinations or delusions today, however, continues to have some religious preoccupation, but some of his pressured behaviors and psychomotor activation has improved. He is less intrusive and having less agitation. We will continue his current regimen without changes and ask his group home staff to schedule another visit to evaluate if pt is appropriate to return to group home.  -Continue inpatient hospitalization.  Will continue today 02/13/2017 plan as below except where it is noted.  - Schizoaffective disorder bipolar type - Continue clozapine  qAM +  qAfternoon (at 17:00) +  qhs - Continue lithium  qhs (level 0.93 on 11/24) - Continuedepakote 2000 mg qhs (level 107 on 11/29) -continue gabapentin  TID for agitation - Continue ativan  q6h prn anxiety/agitation - Continue haldol  q8h prn agitation (or geodon  IM q12h prn agitation if pt refuses oral medications)  - Encourage participation in groups and the therapeutic milieu  - Discharge planning will be ongoing  Sanjuana Kava, NP, PMHNP, FNP-BC. 02/13/2017, 10:48 AMPatient ID: Jack [name], male   DOB: April 28, 1985, 31 y.o.   MRN: [id number]

## 2017-02-13 NOTE — Progress Notes (Signed)
Patient ID: Jack HoseGary T [name], male   DOB: 03-22-1985, 31 y.o.   MRN: [id number]  DAR: Pt. Denies SI/HI and A/V Hallucinations currently. He does not report any pain or discomfort at this time. Support and encouragement provided to the patient however patient is minimal. Today he reports that he is feeling "tired." He has spent most of the day laying in his bed and sleeping intermittently. His affect is flat and mood is preoccupied. Scheduled medications administered to patient per physician's orders. He received PRN Ativan this morning and tolerated well. Q15 minute checks are maintained for safety.

## 2017-02-14 NOTE — Plan of Care (Signed)
  Activity: Sleeping patterns will improve 02/14/2017 2150 - Progressing by Delos HaringPhillips, Jarvis Sawa A, RN Note Pt slept over 6 hrs last night   Safety: Periods of time without injury will increase 02/14/2017 2150 - Progressing by Delos HaringPhillips, Tira Lafferty A, RN Note Pt safe on the unit at this time   Education: Knowledge of the prescribed therapeutic regimen will improve 02/14/2017 2150 - Progressing by Delos HaringPhillips, Calen Posch A, RN Note Pt took medications without incident this evening

## 2017-02-14 NOTE — Progress Notes (Signed)
Patient denies SI, HI and AVH.  Patient has been actively engaged in groups and unit activities and has had no incidents of behavioral dsycontrol.   Assess patient for safety offer medications as prescribed, engage patient in 1:1 staff talks.   Patient able to contract for safety. Continue to monitor as prescribed.

## 2017-02-14 NOTE — Progress Notes (Signed)
Patient ID: Tomasa HoseGary T Blizard, male   DOB: 1985/06/30, 31 y.o.   [facility]: [id number] [facility]  02/14/2017 11:37 AM Tomasa HoseGary T Almeda  MRN:  [id number]  Subjective:Logon reports, "I'm doing good"  Objective: Earley BrookeGary Bodi is a 31 y/o with history of schizoaffective disorder bipolar type and intellectual disability who was admitted with worsening symptoms of psychosis andsymptoms of mania. Pt has grandiose delusional structure that he is a "reverend"selectedby God, and he willinherit a church. Pt also has belief that he is able to communicate with people's spirits, and he has been responding to internal stimuli and having conversations with himself while on the unit.He was restarted on previous medications of depakote, lithium, and clozapine with doses increased, and he has been monitored on the inpatient psychiatry unit. Yesterday, dose of clozapine was increased and pt was changed to TID dosing to address increasing activating behaviors/agitation in the afternoon.  Today, 02-14-17, upon evaluation, pt is alert, cooperative and calm. He is in his room, lying down in bed. He continues to endorse improved mood. He denies SI/HI/AVH. He is sleeping well and his appetite is good. He reports that his medications are helpful & denies any delusional thoughts. Pt does not appear to be responding to any internal stimuli during the interview which is an improvement for him. He continues to carry his bible around with him. 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 He is attending & participating in the group sessions. The attending psychiatrist had diiscussed with patient that we will continue his current treatment regimen and plan to have his group home staff evaluate him again for possible return to his group home, and pt was in agreement with that plan. He had no further questions, comments, or concerns. Staff continue to provide support & encouragement.  Principal  Problem: Schizoaffective disorder, bipolar type (HCC)  Diagnosis:   Patient Active Problem List   Diagnosis Date Noted  . Bipolar affective disorder, manic, severe (HCC) [F31.13] 01/29/2017  . Agitation [R45.1] 03/09/2015  . Bipolar 1 disorder, manic, moderate (HCC) [F31.12] 03/08/2015  . Schizoaffective disorder, bipolar type (HCC) [F25.0] 01/10/2012  . Intellectual disability [F79] 01/10/2012   Total Time spent with patient: 15 minutes  Past Psychiatric History: See H&P  Past Medical History:  Past Medical History:  Diagnosis Date  . Bipolar 1 disorder (HCC)   . Intellectual disability   . Mental retardation   . Schizophrenia, schizo-affective (HCC)   . Seizures (HCC)    History reviewed. No pertinent surgical history.  Family History: History reviewed. No pertinent family history.  Family Psychiatric  History: See H&P  Social History:  Social History   Substance and Sexual Activity  Alcohol Use No     Social History   Substance and Sexual Activity  Drug Use No    Social History   Socioeconomic History  . Marital status: Single    Spouse name: None  . Number of children: None  . Years of education: None  . Highest education level: None  Social Needs  . Financial resource strain: None  . Food insecurity - worry: None  . Food insecurity - inability: None  . Transportation needs - medical: None  . Transportation needs - non-medical: None  Occupational History  . None  Tobacco Use  . Smoking status: Never Smoker  . Smokeless tobacco: Never Used  Substance and Sexual Activity  . Alcohol use: No  . Drug use: No  .  Sexual activity: Not Currently  Other Topics Concern  . None  Social History Narrative  . None   Additional Social History:   Sleep: Good  Appetite:  Good  Current Medications: Current Facility-Administered Medications  Medication Dose Route Frequency Provider Last Rate Last Dose  . acetaminophen (TYLENOL) tablet 650 mg  650 mg Oral  Q4H PRN Charm Rings, NP   650 mg at 02/12/17 1854  . alum & mag hydroxide-simeth (MAALOX/MYLANTA) 200-200-20 MG/5ML suspension 30 mL  30 mL Oral Q4H PRN Charm Rings, NP      . cloZAPine (CLOZARIL) tablet 100 mg  100 mg Oral BID Micheal Likens, MD   100 mg at 02/14/17 0802  . cloZAPine (CLOZARIL) tablet 300 mg  300 mg Oral QHS Micheal Likens, MD   300 mg at 02/13/17 2026  . divalproex (DEPAKOTE) DR tablet 2,000 mg  2,000 mg Oral QHS Ersie Savino I, NP   2,000 mg at 02/13/17 2026  . docusate sodium (COLACE) capsule 100 mg  100 mg Oral Daily PRN Charm Rings, NP      . gabapentin (NEURONTIN) capsule 300 mg  300 mg Oral TID Micheal Likens, MD   300 mg at 02/14/17 0802  . hydrOXYzine (ATARAX/VISTARIL) tablet 50 mg  50 mg Oral Q6H PRN Micheal Likens, MD   50 mg at 02/13/17 2026  . lithium carbonate (ESKALITH) CR tablet 900 mg  900 mg Oral QHS Charm Rings, NP   900 mg at 02/13/17 2026  . LORazepam (ATIVAN) tablet 1 mg  1 mg Oral Q6H PRN Oneta Rack, NP   1 mg at 02/13/17 2026  . magnesium hydroxide (MILK OF MAGNESIA) suspension 30 mL  30 mL Oral Daily PRN Charm Rings, NP      . magnesium oxide (MAG-OX) tablet 400 mg  400 mg Oral Daily Charm Rings, NP   400 mg at 02/14/17 0802  . OLANZapine (ZYPREXA) tablet 10 mg  10 mg Oral Q8H PRN Micheal Likens, MD   10 mg at 02/12/17 2148   Lab Results:  No results found for this or any previous visit (from the past 48 hour(s)). Blood Alcohol level:  Lab Results  Component Value Date   ETH <10 01/27/2017   ETH <5 03/07/2015   Metabolic Disorder Labs: Lab Results  Component Value Date   HGBA1C 5.0 01/31/2017   MPG 96.8 01/31/2017   Lab Results  Component Value Date   PROLACTIN 10.4 01/31/2017   Lab Results  Component Value Date   CHOL 128 01/31/2017   TRIG 105 01/31/2017   HDL 47 01/31/2017   CHOLHDL 2.7 01/31/2017   VLDL 21 01/31/2017   LDLCALC 60 01/31/2017   Physical  Findings: AIMS: Facial and Oral Movements Muscles of Facial Expression: None, normal Lips and Perioral Area: None, normal Jaw: None, normal Tongue: None, normal,Extremity Movements Upper (arms, wrists, hands, fingers): None, normal Lower (legs, knees, ankles, toes): None, normal, Trunk Movements Neck, shoulders, hips: None, normal, Overall Severity Severity of abnormal movements (highest score from questions above): None, normal Incapacitation due to abnormal movements: None, normal Patient's awareness of abnormal movements (rate only patient's report): No Awareness, Dental Status Current problems with teeth and/or dentures?: No Does patient usually wear dentures?: No  CIWA:    COWS:     Musculoskeletal: Strength & Muscle Tone: within normal limits Gait & Station: normal Patient leans: N/A  Psychiatric Specialty Exam: Physical Exam  Nursing note and  vitals reviewed. Neurological: A cranial nerve deficit is present.    Review of Systems  Constitutional: Negative for chills and fever.  Respiratory: Negative for cough.   Cardiovascular: Negative for chest pain.  Gastrointestinal: Negative for abdominal pain, heartburn, nausea and vomiting.  Psychiatric/Behavioral: Positive for hallucinations. Negative for depression and suicidal ideas. The patient is not nervous/anxious.     Blood pressure 125/67, pulse (!) 110, temperature 98.4 F (36.9 C), temperature source Oral, resp. rate 16, height 5\' 11"  (1.803 m), weight 81.6 kg (180 lb), SpO2 99 %.Body mass index is 25.1 kg/m.  General Appearance: Casual and Fairly Groomed  Eye Contact:  Good  Speech:  Clear and Coherent and Normal Rate  Volume:  Normal  Mood:  Euthymic  Affect:  Appropriate and Congruent  Thought Process:  Coherent, Goal Directed and Descriptions of Associations: Intact  Orientation:  Full (Time, Place, and Person)  Thought Content:  Denies any hallucinations, delusions or paranoia today.  Suicidal Thoughts:   Denies  Homicidal Thoughts:  Denies  Memory:  Immediate;   Fair Recent;   Fair Remote;   Fair  Judgement:  Fair  Insight:  Fair and Lacking  Psychomotor Activity:  Normal  Concentration:  Concentration: Fair  Recall:  [employer]  Fund of Knowledge:  Fair  Language:  Fair  Akathisia:  No  Handed:    AIMS (if indicated):     Assets:  Manufacturing systems engineerCommunication Skills Physical Health Resilience Social Support  ADL's:  Intact  Cognition:  WNL  Sleep:  Number of Hours: 6.75   Treatment Plan Summary: Daily contact with patient to assess and evaluate symptoms and progress in treatment and Medication management. Pt continues to denies any hallucinations or delusions today, however, continues to have some religious preoccupation, but some of his pressured behaviors and psychomotor activation has improved. He is less intrusive and having less agitation. We will continue his current regimen without changes and ask his group home staff to schedule another visit to evaluate if pt is appropriate to return to group home.  -Continue inpatient hospitalization.  Will continue today 02/14/2017 plan as below except where it is noted.  - Schizoaffective disorder bipolar type - Continue clozapine 100mg  qAM + 100mg  qAfternoon (at 17:00) + 300mg  qhs - Continue lithium 900mg  qhs (level 0.93 on 11/24) - Continuedepakote 2000 mg qhs (level 107 on 11/29) -continue gabapentin 300mg  TID for agitation - Continue ativan 1mg  q6h prn anxiety/agitation - Continue haldol 5mg  q8h prn agitation (or geodon 20mg  IM q12h prn agitation if pt refuses oral medications)  - Encourage participation in groups and the therapeutic milieu  - Discharge planning will be ongoing  Sanjuana KavaNwoko, Dawnetta Copenhaver I, NP, PMHNP, FNP-BC. 02/14/2017, 11:37 AMPatient ID: Tomasa HoseGary T Tyner, male   DOB: 07-21-1985, 31 y.o.   MRN: [id number]

## 2017-02-14 NOTE — Progress Notes (Signed)
Adult Psychoeducational Group Note  Date:  02/14/2017 Time:  10:10 PM  Group Topic/Focus:  Wrap-Up Group:   The focus of this group is to help patients review their daily goal of treatment and discuss progress on daily workbooks.  Participation Level:  Active  Participation Quality:  Appropriate  Affect:  Appropriate  Cognitive:  Appropriate  Insight: Appropriate  Engagement in Group:  Engaged  Modes of Intervention:  Discussion  Additional Comments:  The patient expressed that he rates today a 10.The patient also said that he attend groups today.  Octavio Mannshigpen, Mayce Noyes Lee 02/14/2017, 10:10 PM

## 2017-02-14 NOTE — Progress Notes (Signed)
D: Pt still religiously preoccupied, pt continues to talk " to people in the spirit" , pt did not have any outburst this evening. Pt has been visible on the unit    A: Pt was offered support and encouragement. Pt was given scheduled medications. Pt was encourage to attend groups. Q 15 minute checks were done for safety.   R:Pt attends groups and interacts well with peers and staff. Pt is taking medication. Pt has no complaints at this time .Pt receptive to treatment and safety maintained on unit.

## 2017-02-15 MED ORDER — LITHIUM CARBONATE ER 450 MG PO TBCR
900.0000 mg | EXTENDED_RELEASE_TABLET | Freq: Every day | ORAL | Status: DC
  Administered 2017-02-15 – 2017-02-20 (×6): 900 mg via ORAL
  Filled 2017-02-15 (×8): qty 2

## 2017-02-15 MED ORDER — DIVALPROEX SODIUM 500 MG PO DR TAB
2000.0000 mg | DELAYED_RELEASE_TABLET | Freq: Every day | ORAL | Status: DC
  Administered 2017-02-15 – 2017-02-20 (×6): 2000 mg via ORAL
  Filled 2017-02-15 (×7): qty 4

## 2017-02-15 NOTE — Progress Notes (Signed)
Recreation Therapy Notes  Date: 02/15/17 Time: 1000 Location: 500 Hall Dayroom  Group Topic: Communication  Goal Area(s) Addresses:  Patient will effectively communicate with peers in group.  Patient will verbalize benefit of healthy communication. Patient will verbalize positive effect of healthy communication on post d/c goals.  Patient will identify communication techniques that made activity effective for group.   Intervention:  Blank paper, pencils, geometrical drawings                   Activity: Back to Back Drawings.  Patients were grouped in pairs.  Patients were seated back to back.  One person was given [name] picture to describe to their partner.  The person listening could not ask any questions of the speaker.  Once they were finished, they could compare pictures to see how accurate the listener was in drawing the picture.  Education:Communication, Discharge Planning  Education Outcome: Acknowledges understanding/In group clarification offered/Needs additional education.   Clinical Observations/Feedback: Pt did not attend group.    Jack [name], Jack [name]         Jack RancherLindsay, Jack [name] 02/15/2017 12:05 PM

## 2017-02-15 NOTE — Progress Notes (Signed)
Patient denies SI, HI and AVH.  Patient has been actively engaged in groups and unit activities and has had no incidents of behavioral dsycontrol.   Assess patient for safety offer medications as prescribed, engage patient in 1:1 staff talks.   Patient able to contract for safety. Continue to monitor as prescribed.  

## 2017-02-15 NOTE — Progress Notes (Deleted)
Recreation Therapy Notes  Date: 02/15/17 Time: 1000 Location: 500 Hall Dayroom  Group Topic: Communication  Goal Area(s) Addresses:  Patient will effectively communicate with peers in group.  Patient will verbalize benefit of healthy communication. Patient will verbalize positive effect of healthy communication on post d/c goals.  Patient will identify communication techniques that made activity effective for group.   Behavioral Response: Minimal  Intervention:  Blank paper, pencils, geometrical drawings    Activity: Back to Back Drawings.  Patients were grouped in pairs.  Patients were seated back to back.  One person was given [name] picture to describe to their partner.  The person listening could not ask any questions of the speaker.  Once they were finished, they could compare pictures to see how accurate the listener was in drawing the picture.  Education: Communication, Discharge Planning  Education Outcome: Acknowledges understanding/In group clarification offered/Needs additional education.   Clinical Observations/Feedback: Pt arrived late but participated.  Pt stated he drew the easiest thing to understand on the paper.   Jack RancherMarjette Nimrit [name], LRT/CTRS         Jack RancherLindsay, Jack [name] 02/15/2017 11:55 AM

## 2017-02-15 NOTE — Progress Notes (Signed)
[facility]  02/15/2017 11:10 AM Jack [name]  MRN:  [id number] Subjective:    Jack [name] is a 31 y/o with history of schizoaffective disorder bipolar type and intellectual disability who was admitted with worsening symptoms of psychosis andsymptoms of mania. Pt has grandiose delusions that he isa"reverend"chosenby God toinherit a church. Pt also has belief that he is able to communicate with people's spirits, which he continues to demonstrate during interviews by holding two-sided conversations with unseen others during the interview.He was restarted on previous medications of depakote, lithium, and clozapine. His doses were increased, and he has been monitored on the inpatient psychiatry unit. Pt had dosing of clozapine adjusted to TID scheduling, and he had ongoing improvement of his agitation behaviors.  Today upon evaluation, pt reports he is feeling "okay." He is sleeping well and his appetite is good. He reports ongoing AH of being able to communicate with spirits, and he reports, "I just talked with my momma - she's my main doctor." He reports having VH, but he declines to describe them, stating, "That's my business." He denies SI/HI. He is tolerating his medications without difficulty or side effect. RN staff reports pt has some increased agitation and psychomotor activity around the late afternoon/early evening, and they have also noticed that pt has self-stimulating sexual behaviors which appear to calm him (pt has been appropriate by remaining private in his room).   Discussed with patient that we will adjust his evening medications of lithium and depakote to be dosed at dinner time to address some of his ongoing episodes of agitation, and pt verbalized good understanding. We also discussed with patient that he is doing well overall and we will schedule his group home staff to come and evaluate him for possible return to group home in the coming days. Pt was in agreement  with the above plan, and he had no further questions, comments, or concerns.    Principal Problem: Schizoaffective disorder, bipolar type (HCC) Diagnosis:   Patient Active Problem List   Diagnosis Date Noted  . Bipolar affective disorder, manic, severe (HCC) [F31.13] 01/29/2017  . Agitation [R45.1] 03/09/2015  . Bipolar 1 disorder, manic, moderate (HCC) [F31.12] 03/08/2015  . Schizoaffective disorder, bipolar type (HCC) [F25.0] 01/10/2012  . Intellectual disability [F79] 01/10/2012   Total Time spent with patient: 30 minutes  Past Psychiatric History: see H&P  Past Medical History:  Past Medical History:  Diagnosis Date  . Bipolar 1 disorder (HCC)   . Intellectual disability   . Mental retardation   . Schizophrenia, schizo-affective (HCC)   . Seizures (HCC)    History reviewed. No pertinent surgical history. Family History: History reviewed. No pertinent family history. Family Psychiatric  History: see H&P Social History:  Social History   Substance and Sexual Activity  Alcohol Use No     Social History   Substance and Sexual Activity  Drug Use No    Social History   Socioeconomic History  . Marital status: Single    Spouse name: None  . Number of children: None  . Years of education: None  . Highest education level: None  Social Needs  . Financial resource strain: None  . Food insecurity - worry: None  . Food insecurity - inability: None  . Transportation needs - medical: None  . Transportation needs - non-medical: None  Occupational History  . None  Tobacco Use  . Smoking status: Never Smoker  . Smokeless tobacco: Never Used  Substance and Sexual Activity  .  Alcohol use: No  . Drug use: No  . Sexual activity: Not Currently  Other Topics Concern  . None  Social History Narrative  . None   Additional Social History:                         Sleep: Fair  Appetite:  Fair  Current Medications: Current Facility-Administered Medications   Medication Dose Route Frequency Provider Last Rate Last Dose  . acetaminophen (TYLENOL) tablet 650 mg  650 mg Oral Q4H PRN Charm RingsLord, Jamison Y, NP   650 mg at 02/12/17 1854  . alum & mag hydroxide-simeth (MAALOX/MYLANTA) 200-200-20 MG/5ML suspension 30 mL  30 mL Oral Q4H PRN Charm RingsLord, Jamison Y, NP      . cloZAPine (CLOZARIL) tablet 100 mg  100 mg Oral BID Micheal Likensain[place], Yoandri Congrove T, MD   100 mg at 02/15/17 0759  . cloZAPine (CLOZARIL) tablet 300 mg  300 mg Oral QHS Micheal Likens[place], Glennys Schorsch T, MD   300 mg at 02/14/17 2016  . divalproex (DEPAKOTE) DR tablet 2,000 mg  2,000 mg Oral Daily Brylinn Teaney T, MD      . docusate sodium (COLACE) capsule 100 mg  100 mg Oral Daily PRN Charm RingsLord, Jamison Y, NP      . gabapentin (NEURONTIN) capsule 300 mg  300 mg Oral TID Micheal Likens[place], Micole Delehanty T, MD   300 mg at 02/15/17 0759  . hydrOXYzine (ATARAX/VISTARIL) tablet 50 mg  50 mg Oral Q6H PRN Micheal Likens[place], Jowell Bossi T, MD   50 mg at 02/14/17 2016  . lithium carbonate (ESKALITH) CR tablet 900 mg  900 mg Oral Daily Jolyne Loaainville, Kiele Heavrin T, MD      . LORazepam (ATIVAN) tablet 1 mg  1 mg Oral Q6H PRN Oneta RackLewis, Tanika N, NP   1 mg at 02/14/17 2016  . magnesium hydroxide (MILK OF MAGNESIA) suspension 30 mL  30 mL Oral Daily PRN Charm RingsLord, Jamison Y, NP      . magnesium oxide (MAG-OX) tablet 400 mg  400 mg Oral Daily Charm RingsLord, Jamison Y, NP   400 mg at 02/15/17 0759  . OLANZapine (ZYPREXA) tablet 10 mg  10 mg Oral Q8H PRN Micheal Likens[place], Billiejo Sorto T, MD   10 mg at 02/12/17 2148    Lab Results: No results found for this or any previous visit (from the past 48 hour(s)).  Blood Alcohol level:  Lab Results  Component Value Date   ETH <10 01/27/2017   ETH <5 03/07/2015    Metabolic Disorder Labs: Lab Results  Component Value Date   HGBA1C 5.0 01/31/2017   MPG 96.8 01/31/2017   Lab Results  Component Value Date   PROLACTIN 10.4 01/31/2017   Lab Results  Component Value Date   CHOL 128 01/31/2017   TRIG 105 01/31/2017   HDL 47  01/31/2017   CHOLHDL 2.7 01/31/2017   VLDL 21 01/31/2017   LDLCALC 60 01/31/2017    Physical Findings: AIMS: Facial and Oral Movements Muscles of Facial Expression: None, normal Lips and Perioral Area: None, normal Jaw: None, normal Tongue: None, normal,Extremity Movements Upper (arms, wrists, hands, fingers): None, normal Lower (legs, knees, ankles, toes): None, normal, Trunk Movements Neck, shoulders, hips: None, normal, Overall Severity Severity of abnormal movements (highest score from questions above): None, normal Incapacitation due to abnormal movements: None, normal Patient's awareness of abnormal movements (rate only patient's report): No Awareness, Dental Status Current problems with teeth and/or dentures?: No Does patient usually wear dentures?: No  CIWA:    COWS:  Musculoskeletal: Strength & Muscle Tone: within normal limits Gait & Station: normal Patient leans: N/A  Psychiatric Specialty Exam: Physical Exam  Nursing note and vitals reviewed.   Review of Systems  Constitutional: Negative for chills and fever.  Respiratory: Negative for cough and shortness of breath.   Cardiovascular: Negative for chest pain and palpitations.  Gastrointestinal: Negative for heartburn and nausea.  Psychiatric/Behavioral: Positive for hallucinations. Negative for depression and suicidal ideas. The patient is not nervous/anxious.     Blood pressure 122/68, pulse (!) 102, temperature 98.2 F (36.8 C), resp. rate 16, height 5\' 11"  (1.803 m), weight 81.6 kg (180 lb), SpO2 99 %.Body mass index is 25.1 kg/m.  General Appearance: Casual  Eye Contact:  Fair  Speech:  Clear and Coherent, Normal Rate and Slurred  Volume:  Normal  Mood:  Euthymic  Affect:  Appropriate and Congruent  Thought Process:  Coherent and Goal Directed  Orientation:  Full (Time, Place, and Person)  Thought Content:  Delusions and Hallucinations: Auditory Visual  Suicidal Thoughts:  No  Homicidal Thoughts:   No  Memory:  Immediate;   Good Recent;   Good Remote;   Good  Judgement:  Impaired  Insight:  Lacking  Psychomotor Activity:  Normal  Concentration:  Concentration: Fair  Recall:  [employer] of Knowledge:  Fair  Language:  Fair  Akathisia:  No  Handed:    AIMS (if indicated):     Assets:  Manufacturing systems engineer Physical Health Resilience Social Support  ADL's:  Intact  Cognition:  WNL  Sleep:  Number of Hours: 6     Treatment Plan Summary: Daily contact with patient to assess and evaluate symptoms and progress in treatment and Medication management . Pt demonstrates improvement of agitation and intrusive behaviors since time of admission, and he appears to have some ongoing delusions, AH, and VH which may be near baseline. RN staff reports some ongoing agitation episodes which occur mostly in the early evening, so we will adjust evening medications of lithium and depakote to be dosed at dinner time. Pt is appropriate to have group home staff evaluate him for possible return to group home in the coming days, and we will have SW team schedule group home staff for a visit.  -Continue inpatient hospitalization.   - Schizoaffective disorder bipolar type - Continue clozapine 100mg  qAM + 100mg  qAfternoon (at 17:00) + 300mg  qhs - Change lithium 900mg  qhs (level 0.93 on 11/24) to lithium 900mg  qDaily at 1700 - Changedepakote 2000 mg qhs (level 107 on 11/29) to depakote 2000mg  qDaily at 1700 -continuegabapentin 300mg  TID for agitation - Continue ativan 1mg  q6h prn anxiety/agitation - Continuehaldol 5mg  q8h prn agitation (or geodon 20mg  IM q12h prn agitation if pt refuses oral medications)  - Encourage participation in groups and the therapeutic milieu  - Discharge planning will be ongoing    Micheal Likens, MD 02/15/2017, 11:10 AM

## 2017-02-16 MED ORDER — LORAZEPAM 1 MG PO TABS
1.0000 mg | ORAL_TABLET | Freq: Two times a day (BID) | ORAL | Status: DC
  Administered 2017-02-16 (×2): 1 mg via ORAL
  Filled 2017-02-16 (×2): qty 1

## 2017-02-16 NOTE — Progress Notes (Signed)
Patient denies SI, HI and AVH.  Patient has been actively engaged in groups and unit activities and has had no incidents of behavioral dsycontrol.   Assess patient for safety offer medications as prescribed, engage patient in 1:1 staff talks.   Patient able to contract for safety. Continue to monitor as prescribed.  

## 2017-02-16 NOTE — Progress Notes (Signed)
Patient rated his day a 10. Patient's goal for today was to keep rising.

## 2017-02-16 NOTE — Progress Notes (Signed)
Recreation Therapy Notes  Date: 02/16/17 Time: 1000 Location: 500 Hall  Group Topic: Teambuilding  Goal Area(s) Addresses:  Patient will effectively work with peers in group.  Patient will verbalize benefit of healthy teambuilding. Patient will verbalize positive effect of healthy team building post d/c.  Patient will identify teambuilding techniques that made activity effective for group.   Intervention: Rubber disks  Activity: Sharks in the [place].  Each patient was given [name] and one extra disk for the group.  Patient were to maneuver each patient from one end of the hallway to the other using only the disks provided.  If any person stepped off their disk, the group would have to start over.  Education: Communication, Discharge Planning  Education Outcome: Acknowledges understanding/In group clarification offered/Needs additional education.   Clinical Observations/Feedback:  Pt did not attend group.    Andras Grunewald, LRT/CTRS         Jack [name] 02/16/2017 11:45 AM 

## 2017-02-16 NOTE — Progress Notes (Signed)
[facility]  02/16/2017 1:34 PM Tomasa HoseGary T Cavins  MRN:  [id number] Subjective:   Earley BrookeGary Sjogren is a 31 y/o with history of schizoaffective disorder bipolar type and intellectual disability who was admitted with worsening symptoms of psychosis, agitation, religious preoccupation/delusions, and mania. Pt has grandiose delusionsthat he isa"reverend"chosenby God toinherit a church. Pt also has belief that he is able to communicate with the spirit of people, and he appears to respond to internal stimuli by having conversations with himself. He was restarted on previous medications of depakote, lithium, and clozapine. His doses were increased, and he has been monitored on the inpatient psychiatry unit.  Today upon evaluation, pt reports that he is doing well overall. 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 When asked about VH, he states, "That's my business." He denies SI/HI. He reports that he is sleeping well and his appetite is good. He had made some statements that his medication was "poison" and pt was asked about these concerns, and he replied, "My main doctor is my momma, so I don't want to know anything about the medications except from her." Pt endorses additional paranoid content, stating, "God is telling me they are giving me the wrong medications." Discussed with patient that we are reviewing his medications daily and working with pharmacist to assure that he is receiving the correct medications. Pt is scheduled to have a group home staff member visit tomorrow for potential evaluation of returning to his group home, and pt's initial response to hearing that news was to say, "I don't want to go back there, you're going to have to find me a different group home." Discussed with patient that his discharge  from the hospital could be delayed if he refuses to return to his group home, and that if he wants to change his living situation, that he could continue to pursue that option after discharge. Pt verbalized good understanding, and he had no further questions, comments, or concerns.  Collateral information from pt's mother via SW team, suggests that pt is having some improvement of his agitation since admission. Pt's mother also reports that pt previously had period of significantly improved stability with use of scheduled lorazepam. As pt has been receiving lorazepam as a PRN medication, we will plan to make it a scheduled medication in the later afternoon and evening to help with episodic agitation.     Principal Problem: Schizoaffective disorder, bipolar type (HCC) Diagnosis:   Patient Active Problem List   Diagnosis Date Noted  . Bipolar affective disorder, manic, severe (HCC) [F31.13] 01/29/2017  . Agitation [R45.1] 03/09/2015  . Bipolar 1 disorder, manic, moderate (HCC) [F31.12] 03/08/2015  . Schizoaffective disorder, bipolar type (HCC) [F25.0] 01/10/2012  . Intellectual disability [F79] 01/10/2012   Total Time spent with patient: 30 minutes  Past Psychiatric History: see H&P  Past Medical History:  Past Medical History:  Diagnosis Date  . Bipolar 1 disorder (HCC)   . Intellectual disability   . Mental retardation   . Schizophrenia, schizo-affective (HCC)   . Seizures (HCC)    History reviewed. No pertinent surgical history. Family History: History reviewed. No pertinent family history. Family Psychiatric  History: see H&P Social History:  Social History   Substance and Sexual Activity  Alcohol Use No  Social History   Substance and Sexual Activity  Drug Use No    Social History   Socioeconomic History  . Marital status: Single    Spouse name: None  . Number of children: None  . Years of education: None  . Highest education level: None  Social Needs  .  Financial resource strain: None  . Food insecurity - worry: None  . Food insecurity - inability: None  . Transportation needs - medical: None  . Transportation needs - non-medical: None  Occupational History  . None  Tobacco Use  . Smoking status: Never Smoker  . Smokeless tobacco: Never Used  Substance and Sexual Activity  . Alcohol use: No  . Drug use: No  . Sexual activity: Not Currently  Other Topics Concern  . None  Social History Narrative  . None   Additional Social History:                         Sleep: Fair  Appetite:  Fair  Current Medications: Current Facility-Administered Medications  Medication Dose Route Frequency Provider Last Rate Last Dose  . acetaminophen (TYLENOL) tablet 650 mg  650 mg Oral Q4H PRN Charm Rings, NP   650 mg at 02/12/17 1854  . alum & mag hydroxide-simeth (MAALOX/MYLANTA) 200-200-20 MG/5ML suspension 30 mL  30 mL Oral Q4H PRN Charm Rings, NP      . cloZAPine (CLOZARIL) tablet 100 mg  100 mg Oral BID Micheal Likens, MD   100 mg at 02/16/17 0729  . cloZAPine (CLOZARIL) tablet 300 mg  300 mg Oral QHS Micheal Likens, MD   300 mg at 02/15/17 2110  . divalproex (DEPAKOTE) DR tablet 2,000 mg  2,000 mg Oral Daily Micheal Likens, MD   2,000 mg at 02/15/17 1804  . docusate sodium (COLACE) capsule 100 mg  100 mg Oral Daily PRN Charm Rings, NP      . gabapentin (NEURONTIN) capsule 300 mg  300 mg Oral TID Micheal Likens, MD   300 mg at 02/16/17 1201  . hydrOXYzine (ATARAX/VISTARIL) tablet 50 mg  50 mg Oral Q6H PRN Micheal Likens, MD   50 mg at 02/15/17 1807  . lithium carbonate (ESKALITH) CR tablet 900 mg  900 mg Oral Daily Micheal Likens, MD   900 mg at 02/15/17 1805  . LORazepam (ATIVAN) tablet 1 mg  1 mg Oral BID Jolyne Loa T, MD      . magnesium hydroxide (MILK OF MAGNESIA) suspension 30 mL  30 mL Oral Daily PRN Charm Rings, NP      . magnesium oxide  (MAG-OX) tablet 400 mg  400 mg Oral Daily Charm Rings, NP   400 mg at 02/16/17 0729  . OLANZapine (ZYPREXA) tablet 10 mg  10 mg Oral Q8H PRN Micheal Likens, MD   10 mg at 02/12/17 2148    Lab Results: No results found for this or any previous visit (from the past 48 hour(s)).  Blood Alcohol level:  Lab Results  Component Value Date   [facility] <10 01/27/2017   ETH <5 03/07/2015    Metabolic Disorder Labs: Lab Results  Component Value Date   HGBA1C 5.0 01/31/2017   MPG 96.8 01/31/2017   Lab Results  Component Value Date   PROLACTIN 10.4 01/31/2017   Lab Results  Component Value Date   CHOL 128 01/31/2017   TRIG 105 01/31/2017   HDL 47 01/31/2017  CHOLHDL 2.7 01/31/2017   VLDL 21 01/31/2017   LDLCALC 60 01/31/2017    Physical Findings: AIMS: Facial and Oral Movements Muscles of Facial Expression: None, normal Lips and Perioral Area: None, normal Jaw: None, normal Tongue: None, normal,Extremity Movements Upper (arms, wrists, hands, fingers): None, normal Lower (legs, knees, ankles, toes): None, normal, Trunk Movements Neck, shoulders, hips: None, normal, Overall Severity Severity of abnormal movements (highest score from questions above): None, normal Incapacitation due to abnormal movements: None, normal Patient's awareness of abnormal movements (rate only patient's report): No Awareness, Dental Status Current problems with teeth and/or dentures?: No Does patient usually wear dentures?: No  CIWA:    COWS:     Musculoskeletal: Strength & Muscle Tone: within normal limits Gait & Station: normal Patient leans: N/A  Psychiatric Specialty Exam: Physical Exam  Nursing note and vitals reviewed.   Review of Systems  Constitutional: Negative for chills and fever.  Respiratory: Negative for cough.   Cardiovascular: Negative for chest pain.  Gastrointestinal: Negative for heartburn and nausea.  Psychiatric/Behavioral: Positive for hallucinations. Negative  for depression and suicidal ideas.    Blood pressure 111/76, pulse (!) 112, temperature 97.7 F (36.5 C), temperature source Oral, resp. rate 16, height 5\' 11"  (1.803 m), weight 81.6 kg (180 lb), SpO2 99 %.Body mass index is 25.1 kg/m.  General Appearance: Casual and Disheveled  Eye Contact:  Fair  Speech:  Pressured  Volume:  Increased  Mood:  Angry, Anxious and Irritable  Affect:  Congruent and Labile  Thought Process:  Disorganized and Descriptions of Associations: Loose  Orientation:  Full (Time, Place, and Person)  Thought Content:  Delusions, Hallucinations: Auditory Visual, Ideas of Reference:   Paranoia Delusions and Obsessions  Suicidal Thoughts:  No  Homicidal Thoughts:  No  Memory:  Immediate;   Good Recent;   Good Remote;   Good  Judgement:  Fair  Insight:  Fair  Psychomotor Activity:  Normal  Concentration:  Concentration: Good  Recall:  Good  Fund of Knowledge:  Good  Language:  Good  Akathisia:  No  Handed:    AIMS (if indicated):     Assets:  Resilience Social Support  ADL's:  Intact  Cognition:  WNL  Sleep:  Number of Hours: 6.25     Treatment Plan Summary: Daily contact with patient to assess and evaluate symptoms and progress in treatment and Medication management. Pt has some ongoing episodes of agitation, delusions, grandiosity, AH, and VH. Overall he has some improvement since admission, and we are planning to have group home staff evaluate him tomorrow to see if he is appropriate to return to his group home. He will be changed from PRN ativan to scheduled ativan in the afternoon/early evening to help address some ongoing agitation behaviors.  -Continue inpatient hospitalization.   - Schizoaffective disorder bipolar type - Continue clozapine 100mg  qAM + 100mg  qAfternoon (at 17:00) + 300mg  qhs - Continue lithium 900mg  qDaily at 1700 (level 0.93 on 11/24) - Continue  depakote 2000mg  qDaily at 1700 (level 107 on  11/29)  -continuegabapentin 300mg  TID for agitation - Change ativan 1mg  q6h prn anxiety/agitation to ativan 1mg  BID at 1700 and 2200 - Continuehaldol 5mg  q8h prn agitation (or geodon 20mg  IM q12h prn agitation if pt refuses oral medications)  - Encourage participation in groups and the therapeutic milieu  - Discharge planning will be ongoing    Micheal Likenshristopher T Ilana Prezioso, MD 02/16/2017, 1:34 PM

## 2017-02-16 NOTE — Progress Notes (Signed)
  DATA ACTION RESPONSE  Objective- Pt. is visible in the dayroom, seen eating a snack. Presents with an animated affect and mood. Pt was tangential/hyperverbal/hyperrreligious in thought process. No further c/o. No abnormal s/s.  Subjective- Denies having any SI/HI/AVH/Pain at this time. Is cooperative and remains safe on the unit.  1:1 interaction in private to establish rapport. Encouragement, education, & support given from staff.     Safety maintained with Q 15 checks. Continue with POC.

## 2017-02-16 NOTE — Progress Notes (Signed)
Pt mildly agitated.  Pt roams the hallways with his bible mumbling incoherently. Pt asks for his meds and wants to go to sleep. Meds given. Pt in bed sleeping.

## 2017-02-17 MED ORDER — CLOZAPINE 25 MG PO TABS
350.0000 mg | ORAL_TABLET | Freq: Every day | ORAL | Status: DC
  Administered 2017-02-17 – 2017-02-19 (×3): 350 mg via ORAL
  Filled 2017-02-17 (×5): qty 2

## 2017-02-17 MED ORDER — LORAZEPAM 1 MG PO TABS
1.0000 mg | ORAL_TABLET | Freq: Three times a day (TID) | ORAL | Status: DC
  Administered 2017-02-17 – 2017-02-20 (×10): 1 mg via ORAL
  Filled 2017-02-17 (×10): qty 1

## 2017-02-17 NOTE — Tx Team (Signed)
Interdisciplinary Treatment and Diagnostic Plan Update  02/17/2017 Time of Session: 3:42 PM  Jack [name] MRN: [id number]  Principal Diagnosis: Schizoaffective disorder, bipolar type [facility])  Secondary Diagnoses: Principal Problem:   Schizoaffective disorder, bipolar type ([place]) Active Problems:   Intellectual disability   Current Medications:  Current Facility-Administered Medications  Medication Dose Route Frequency Provider Last Rate Last Dose  . acetaminophen (TYLENOL) tablet 650 mg  650 mg Oral Q4H PRN Patrecia Pour, NP   650 mg at 02/12/17 1854  . alum & mag hydroxide-simeth (MAALOX/MYLANTA) 200-200-20 MG/5ML suspension 30 mL  30 mL Oral Q4H PRN Patrecia Pour, NP      . cloZAPine (CLOZARIL) tablet 100 mg  100 mg Oral BID Pennelope Bracken, MD   100 mg at 02/17/17 0742  . cloZAPine (CLOZARIL) tablet 350 mg  350 mg Oral QHS Maris Berger T, MD      . divalproex (DEPAKOTE) DR tablet 2,000 mg  2,000 mg Oral Daily Pennelope Bracken, MD   2,000 mg at 02/16/17 1632  . docusate sodium (COLACE) capsule 100 mg  100 mg Oral Daily PRN Patrecia Pour, NP      . gabapentin (NEURONTIN) capsule 300 mg  300 mg Oral TID Pennelope Bracken, MD   300 mg at 02/17/17 1200  . hydrOXYzine (ATARAX/VISTARIL) tablet 50 mg  50 mg Oral Q6H PRN Pennelope Bracken, MD   50 mg at 02/15/17 1807  . lithium carbonate (ESKALITH) CR tablet 900 mg  900 mg Oral Daily Pennelope Bracken, MD   900 mg at 02/16/17 1632  . LORazepam (ATIVAN) tablet 1 mg  1 mg Oral TID Pennelope Bracken, MD      . magnesium hydroxide (MILK OF MAGNESIA) suspension 30 mL  30 mL Oral Daily PRN Patrecia Pour, NP      . magnesium oxide (MAG-OX) tablet 400 mg  400 mg Oral Daily Patrecia Pour, NP   400 mg at 02/17/17 4765  . OLANZapine (ZYPREXA) tablet 10 mg  10 mg Oral Q8H PRN Pennelope Bracken, MD   10 mg at 02/12/17 2148    PTA Medications: Medications Prior to Admission   Medication Sig Dispense Refill Last Dose  . cloZAPine (CLOZARIL) 100 MG tablet Take 1 tablet (100 mg total) by mouth daily. (Patient not taking: Reported on 01/27/2017) 30 tablet 0 Not Taking at Unknown time  . cloZAPine (CLOZARIL) 100 MG tablet Take 3 tablets (300 mg total) by mouth at bedtime. (Patient taking differently: Take 200 mg by mouth at bedtime. ) 90 tablet 0 01/27/2017 at Unknown time  . clozapine (CLOZARIL) 50 MG tablet TK 1 T PO NIGHTLY  1 Not Taking at Unknown time  . divalproex (DEPAKOTE) 500 MG DR tablet Take 1 tablet (500 mg total) by mouth every 12 (twelve) hours. (Patient taking differently: Take 1,500 mg by mouth daily. ) 60 tablet 0 01/27/2017 at 0800  . DOK 100 MG capsule TK 1 C PO BID AS NEEDED FOR CONSTIPATION  1 unknown  . haloperidol (HALDOL) 10 MG tablet TK 1 T PO BID  1 Not Taking at Unknown time  . lithium carbonate (LITHOBID) 300 MG CR tablet Take 3 tablets (900 mg) by mouth daily  1 01/27/2017 at Unknown time  . lithium carbonate 300 MG capsule Take 3 capsules (900 mg total) by mouth at bedtime. (Patient not taking: Reported on 01/27/2017) 90 capsule 0 Not Taking at Unknown time  . LORazepam (ATIVAN) 1 MG  tablet TK 1 T PO TID  1 Not Taking at Unknown time  . magnesium oxide (MAG-OX) 400 MG tablet Take 400 mg by mouth daily.   01/27/2017 at Unknown time  . trihexyphenidyl (ARTANE) 2 MG tablet Take 1 tablet (2 mg total) by mouth 2 (two) times daily with a meal. (Patient not taking: Reported on 01/27/2017) 60 tablet 0 Completed Course at Unknown time    Patient Stressors: Health problems  Patient Strengths: Religious Affiliation  Treatment Modalities: Medication Management, Group therapy, Case management,  1 to 1 session with clinician, Psychoeducation, Recreational therapy.   Physician Treatment Plan for Primary Diagnosis: Schizoaffective disorder, bipolar type (HCC) Long Term Goal(s): Improvement in symptoms so as ready for discharge  Short Term Goals:  Ability to identify changes in lifestyle to reduce recurrence of condition will improve Ability to verbalize feelings will improve Ability to identify and develop effective coping behaviors will improve Compliance with prescribed medications will improve  Medication Management: Evaluate patient's response, side effects, and tolerance of medication regimen.  Therapeutic Interventions: 1 to 1 sessions, Unit Group sessions and Medication administration.  Evaluation of Outcomes: Progressing   12/3:  Both mother and GH manager report patient is more fixated on his delusion, is agitated and pacing, is paranid and accusing, and is not grounded enough to be able to successfully return to [facility].  Will adjust meds accordingly  Physician Treatment Plan for Secondary Diagnosis: Principal Problem:   Schizoaffective disorder, bipolar type (HCC) Active Problems:   Intellectual disability   Long Term Goal(s): Improvement in symptoms so as ready for discharge  Short Term Goals: Ability to identify changes in lifestyle to reduce recurrence of condition will improve Ability to verbalize feelings will improve Ability to identify and develop effective coping behaviors will improve Compliance with prescribed medications will improve  Medication Management: Evaluate patient's response, side effects, and tolerance of medication regimen.  Therapeutic Interventions: 1 to 1 sessions, Unit Group sessions and Medication administration.  Evaluation of Outcomes: Progressing   12/10: Pt continues to denies any hallucinations or delusions today, however, continues to have some religious preoccupation, but some of his pressured behaviors and psychomotor activation has improved. He is less intrusive and having less agitation. - Schizoaffective disorder bipolar type - Continue clozapine 100mg qAM + 100mg qAfternoon (at 17:00) + 300mg qhs - Continue lithium 900mg qhs (level 0.93 on  11/24) - Continuedepakote 2000 mg qhs (level 107 on 11/29) -continuegabapentin 300mg TID for agitation  12/14: he continues to have some episodes of agitation, delusions, grandiosity, religious preoccupation, AH, and VH. We will increase dose of clozapine, change ativan to TID dosing, and monitor for symptom improvement over the weekend. Group home staff is coming today to evaluate if pt is appropriate to return to Group Home.   -Continue inpatient hospitalization.  - Schizoaffective disorder bipolar type - Change clozapine 100mg qAM + 100mg qAfternoon (at 17:00) + 300mg qhs to clozapine 100mg qAM + 100mg qAfternoon (at 17:00) + 350mg qhs - Continuelithium 900mg qDaily at 1700 (level 0.93 on 11/24) - Continuedepakote 2000mg qDaily at 1700 (level 107 on 11/29)  -continuegabapentin 300mg TID for agitation - Changeativan 1mg BID at 1700 and 2200 to ativan 1mg TID at 0800, 1700, and 2200 - Continuehaldol 5mg q8h prn agitation (or geodon 20mg IM q12h prn agitation if pt refuses oral medications)        RN Treatment Plan for Primary Diagnosis: Schizoaffective disorder, bipolar type (HCC) Long Term Goal(s): Knowledge of disease and therapeutic regimen   to maintain health will improve  Short Term Goals: Ability to identify and develop effective coping behaviors will improve and Compliance with prescribed medications will improve  Medication Management: RN will administer medications as ordered by provider, will assess and evaluate patient's response and provide education to patient for prescribed medication. RN will report any adverse and/or side effects to prescribing provider.  Therapeutic Interventions: 1 on 1 counseling sessions, Psychoeducation, Medication administration, Evaluate responses to treatment, Monitor vital signs and CBGs as ordered, Perform/monitor CIWA, COWS,  AIMS and Fall Risk screenings as ordered, Perform wound care treatments as ordered.  Evaluation of Outcomes: Progressing   LCSW Treatment Plan for Primary Diagnosis: Schizoaffective disorder, bipolar type (HCC) Long Term Goal(s): Safe transition to appropriate next level of care at discharge, Engage patient in therapeutic group addressing interpersonal concerns.  Short Term Goals: Engage patient in aftercare planning with referrals and resources  Therapeutic Interventions: Assess for all discharge needs, 1 to 1 time with Social worker, Explore available resources and support systems, Assess for adequacy in community support network, Educate family and significant other(s) on suicide prevention, Complete Psychosocial Assessment, Interpersonal group therapy.  Evaluation of Outcomes: Met  Return to GH, follow up [facility]   Progress in Treatment: Attending groups: Yes Participating in groups: Yes Taking medication as prescribed: Yes Toleration medication: Yes, no side effects reported at this time Family/Significant other contact made: Yes Patient understands diagnosis: No Limited insight Discussing patient identified problems/goals with staff: Yes Medical problems stabilized or resolved: Yes Denies suicidal/homicidal ideation: Yes Issues/concerns per patient self-inventory: None Other: N/A  New problem(s) identified: None identified at this time.   New Short Term/Long Term Goal(s): "I don't want to go back to the GH.  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."  Discharge Plan or Barriers:   Reason for Continuation of Hospitalization:  Delusions   Mania  Medication stabilization   Estimated Length of Stay: 12/19  Attendees: Patient: 02/17/2017  3:42 PM  Physician: [name] Rainville, MD 02/17/2017  3:42 PM  Nursing: Elizabeth Awofadeju, RN 02/17/2017  3:42 PM  RN Care Manager: Jennifer Clark, RN 02/17/2017  3:42 PM  Social Worker: Rod  02/17/2017  3:42 PM  Recreational  Therapist: Marjette Lindsey 02/17/2017  3:42 PM  Other: Delora Sutton 02/17/2017  3:42 PM  Other:  02/17/2017  3:42 PM    Scribe for Treatment Team:    LCSW 02/17/2017 3:42 PM   

## 2017-02-17 NOTE — Progress Notes (Signed)
[facility] MD Progress Note  02/17/2017 12:56 PM Jack [name]  MRN:  [id number] Subjective:   Jack [name] is a 31 y/o with history of schizoaffective disorder bipolar type and intellectual disability who was admitted with worsening symptoms of psychosis, agitation, religious preoccupation/delusions, and mania. Pt has grandiose delusionsthat he isa"reverend"chosenby God toinherit a church. Pt also has belief that he is able to communicate with the spirit of people, and he appears to respond to internal stimuli by having conversations with himself. He was restarted on previous medications of depakote, lithium, and clozapine. Hisdoseswereincreased, and he has been monitored on the inpatient psychiatry unit.  Today upon evaluation, pt shares how he is feeling, stating, "I had to lay down for a while, so I feel calmer today." RN staff had reported that pt had episode of yelling and agitation, and he reported that he had a "seizure," but he was able to be redirected and the behaviors resolved on their own. Pt was asked about the occurrence, and pt replied, "Yeah, it's like a mind seizure; I got scared and nervous." Pt reports he is feeling well now and he is unable to identify anything that triggered his episode.   He continues to endorse AH, and he is guarded in describing them, stating, "It's not the flesh it's the spirit." He is guarded about VH, but endorses them as well stating, "It's my business with God." He denies SI/HI. Pt was asked about addition of scheduled ativan to his regimen and he notes it was helpful for him, and he thinks it would help in the morning as well. Discussed with patient that we will plan to change ativan to TID dosing and also plan to increase his dose of clozapine as well today. We are anticipating staff from his group home to come for a visit today to evaluate if pt is appropriate to return.   Principal Problem: Schizoaffective disorder, bipolar type (HCC) Diagnosis:    Patient Active Problem List   Diagnosis Date Noted  . Bipolar affective disorder, manic, severe (HCC) [F31.13] 01/29/2017  . Agitation [R45.1] 03/09/2015  . Bipolar 1 disorder, manic, moderate (HCC) [F31.12] 03/08/2015  . Schizoaffective disorder, bipolar type (HCC) [F25.0] 01/10/2012  . Intellectual disability [F79] 01/10/2012   Total Time spent with patient: 30 minutes  Past Psychiatric History: see H&P  Past Medical History:  Past Medical History:  Diagnosis Date  . Bipolar 1 disorder (HCC)   . Intellectual disability   . Mental retardation   . Schizophrenia, schizo-affective (HCC)   . Seizures (HCC)    History reviewed. No pertinent surgical history. Family History: History reviewed. No pertinent family history. Family Psychiatric  History: see H&P Social History:  Social History   Substance and Sexual Activity  Alcohol Use No     Social History   Substance and Sexual Activity  Drug Use No    Social History   Socioeconomic History  . Marital status: Single    Spouse name: None  . Number of children: None  . Years of education: None  . Highest education level: None  Social Needs  . Financial resource strain: None  . Food insecurity - worry: None  . Food insecurity - inability: None  . Transportation needs - medical: None  . Transportation needs - non-medical: None  Occupational History  . None  Tobacco Use  . Smoking status: Never Smoker  . Smokeless tobacco: Never Used  Substance and Sexual Activity  . Alcohol use: No  . Drug use:  No  . Sexual activity: Not Currently  Other Topics Concern  . None  Social History Narrative  . None   Additional Social History:                         Sleep: Good  Appetite:  Good  Current Medications: Current Facility-Administered Medications  Medication Dose Route Frequency Provider Last Rate Last Dose  . acetaminophen (TYLENOL) tablet 650 mg  650 mg Oral Q4H PRN Charm RingsLord, Jamison Y, NP   650 mg at  02/12/17 1854  . alum & mag hydroxide-simeth (MAALOX/MYLANTA) 200-200-20 MG/5ML suspension 30 mL  30 mL Oral Q4H PRN Charm RingsLord, Jamison Y, NP      . cloZAPine (CLOZARIL) tablet 100 mg  100 mg Oral BID Micheal Likensainville, Benny Deutschman T, MD   100 mg at 02/17/17 0742  . cloZAPine (CLOZARIL) tablet 300 mg  300 mg Oral QHS Micheal Likens[place], Sevana Grandinetti T, MD   300 mg at 02/16/17 2104  . divalproex (DEPAKOTE) DR tablet 2,000 mg  2,000 mg Oral Daily Micheal Lik[place], Cruz Bong T, MD   2,000 mg at 02/16/17 1632  . docusate sodium (COLACE) capsule 100 mg  100 mg Oral Daily PRN Charm RingsLord, Jamison Y, NP      . gabapentin (NEURONTIN) capsule 300 mg  300 mg Oral TID Micheal Likens[place], Mazin Emma T, MD   300 mg at 02/17/17 1200  . hydrOXYzine (ATARAX/VISTARIL) tablet 50 mg  50 mg Oral Q6H PRN Micheal Likens[place], Kasson Lamere T, MD   50 mg at 02/15/17 1807  . lithium carbonate (ESKALITH) CR tablet 900 mg  900 mg Oral Daily Micheal [place], Lexys Milliner T, MD   900 mg at 02/16/17 1632  . LORazepam (ATIVAN) tablet 1 mg  1 mg Oral BID Micheal Lik[place], Lucine Bilski T, MD   1 mg at 02/16/17 2104  . magnesium hydroxide (MILK OF MAGNESIA) suspension 30 mL  30 mL Oral Daily PRN Charm RingsLord, Jamison Y, NP      . magnesium oxide (MAG-OX) tablet 400 mg  400 mg Oral Daily Charm RingsLord, Jamison Y, NP   400 mg at 02/17/17 [id number]  . OLANZapine (ZYPREXA) tablet 10 mg  10 mg Oral Q8H PRN Micheal Likens[place], Kaydense Rizo T, MD   10 mg at 02/12/17 2148    Lab Results: No results found for this or any previous visit (from the past 48 hour(s)).  Blood Alcohol level:  Lab Results  Component Value Date   ETH <10 01/27/2017   ETH <5 03/07/2015    Metabolic Disorder Labs: Lab Results  Component Value Date   HGBA1C 5.0 01/31/2017   MPG 96.8 01/31/2017   Lab Results  Component Value Date   PROLACTIN 10.4 01/31/2017   Lab Results  Component Value Date   CHOL 128 01/31/2017   TRIG 105 01/31/2017   HDL 47 01/31/2017   CHOLHDL 2.7 01/31/2017   VLDL 21 01/31/2017   LDLCALC 60 01/31/2017    Physical  Findings: AIMS: Facial and Oral Movements Muscles of Facial Expression: None, normal Lips and Perioral Area: None, normal Jaw: None, normal Tongue: None, normal,Extremity Movements Upper (arms, wrists, hands, fingers): None, normal Lower (legs, knees, ankles, toes): None, normal, Trunk Movements Neck, shoulders, hips: None, normal, Overall Severity Severity of abnormal movements (highest score from questions above): None, normal Incapacitation due to abnormal movements: None, normal Patient's awareness of abnormal movements (rate only patient's report): No Awareness, Dental Status Current problems with teeth and/or dentures?: No Does patient usually wear dentures?: No  CIWA:    COWS:  Musculoskeletal: Strength & Muscle Tone: within normal limits Gait & Station: normal Patient leans: N/A  Psychiatric Specialty Exam: Physical Exam  Nursing note and vitals reviewed.   Review of Systems  Constitutional: Negative for chills and fever.  HENT: Negative for hearing loss.   Respiratory: Negative for cough and sputum production.   Cardiovascular: Negative for chest pain.  Gastrointestinal: Negative for heartburn and nausea.  Psychiatric/Behavioral: Positive for hallucinations. Negative for depression and suicidal ideas.    Blood pressure (!) 145/69, pulse (!) 127, temperature 98.8 F (37.1 C), temperature source Oral, resp. rate 16, height 5\' 11"  (1.803 m), weight 81.6 kg (180 lb), SpO2 99 %.Body mass index is 25.1 kg/m.  General Appearance: Casual and Fairly Groomed  Eye Contact:  Good  Speech:  Clear and Coherent  Volume:  Normal  Mood:  Euthymic  Affect:  Appropriate, Blunt, Congruent and Labile  Thought Process:  Coherent, Disorganized, Goal Directed and Descriptions of Associations: Loose  Orientation:  Full (Time, Place, and Person)  Thought Content:  Delusions, Hallucinations: Auditory Visual, Ideas of Reference:   Delusions and Obsessions  Suicidal Thoughts:  No   Homicidal Thoughts:  No  Memory:  Immediate;   Good Recent;   Good Remote;   Good  Judgement:  Impaired  Insight:  Fair  Psychomotor Activity:  Normal  Concentration:  Concentration: Fair  Recall:  FiservFair  Fund of Knowledge:  Fair  Language:  Fair  Akathisia:  No  Handed:    AIMS (if indicated):     Assets:  Manufacturing systems engineerCommunication Skills Physical Health Resilience Social Support  ADL's:  Intact  Cognition:  WNL  Sleep:  Number of Hours: 6.5     Treatment Plan Summary: Daily contact with patient to assess and evaluate symptoms and progress in treatment and Medication management. Pt has been showing improvement of agitation and intrusive behaviors, but he continues to have some episodes of agitation, delusions, grandiosity, religious preoccupation, AH, and VH. We will increase dose of clozapine, change ativan to TID dosing, and monitor for symptom improvement over the weekend. Group home staff is coming today to evaluate if pt is appropriate to return to Group Home.   -Continue inpatient hospitalization.  - Schizoaffective disorder bipolar type - Change clozapine 100mg  qAM + 100mg  qAfternoon (at 17:00) + 300mg  qhs to clozapine 100mg  qAM + 100mg  qAfternoon (at 17:00) + 350mg  qhs - Continue lithium 900mg  qDaily at 1700 (level 0.93 on 11/24) - Continue  depakote 2000mg  qDaily at 1700 (level 107 on 11/29)  -continuegabapentin 300mg  TID for agitation - Change ativan 1mg  BID at 1700 and 2200 to ativan 1mg  TID at 0800, 1700, and 2200 - Continuehaldol 5mg  q8h prn agitation (or geodon 20mg  IM q12h prn agitation if pt refuses oral medications)  - Encourage participation in groups and the therapeutic milieu  - Discharge planning will be ongoing    Micheal Likenshristopher T Jassica Zazueta, MD 02/17/2017, 12:56 PM

## 2017-02-17 NOTE — Progress Notes (Signed)
D: Pt denies SI/HI. Pt is pleasant and cooperative. Pt had no outburst this evening, pt religous outburst very minimal at this time, pt continues to talk with "spirit" but was less noticeable this evening.   A: Pt was offered support and encouragement. Pt was given scheduled medications. Pt was encourage to attend groups. Q 15 minute checks were done for safety.   R:Pt attends groups and interacts well with peers and staff. Pt is taking medication. Pt has no complaints.Pt receptive to treatment and safety maintained on unit.

## 2017-02-17 NOTE — Progress Notes (Addendum)
D Patient is seen as he paces around the adult unit 400 hall. He is observed  walking around a small ,  area, as he " preaches the word". He is hypomanic, delusional and responding to internal stimuli and this is evidenced by the patient walking around the unit.He clutches his bible to his body, then smiles and laughs and abruptly cahrges to the nurses' station and announces loudly  " I  Need to talk the word of god!!!". Patient is then observed smiling broadly and he turns on his heel and ambulates straight back into the dayroom. Staff report this is his normal, endorsing religious preoccupation and tangential thinking.  A HE completed his daily assessment and on this he wrote he denied SI today and he rated his depression and  Hopelessness " 10/5", respectively. R Safety is in place. Pt is compliant with antipsychotics, his mental state is plateauing off and staff reprot his bizarre behaviors are much less. DEC planning includes beginning of next week.

## 2017-02-17 NOTE — Progress Notes (Signed)
Adult Psychoeducational Group Note  Date:  02/17/2017 Time:  11:06 PM  Group Topic/Focus:  Wrap-Up Group:   The focus of this group is to help patients review their daily goal of treatment and discuss progress on daily workbooks.  Participation Level:  Minimal  Participation Quality:  Appropriate  Affect:  Flat  Cognitive:  Confused  Insight: Limited  Engagement in Group:  Engaged  Modes of Intervention:  Socialization and Support  Additional Comments:  Patient attended and participated in group tonight. He reports having a calmer day. The spirits were calm today and he is feeling good. He went for his meals, attended his group and talk with his Doctor.  Lita MainsFrancis, Gunter Conde [facility] 02/17/2017, 11:06 PM

## 2017-02-17 NOTE — Progress Notes (Signed)
Recreation Therapy Notes  Date: 02/17/14 Time: 1000 Location: 500 Hall Dayroom  Group Topic: Wellness  Goal Area(s) Addresses:  Patient will define components of whole wellness. Patient will verbalize benefit of whole wellness.  Behavioral Response: Minimal  Intervention:  Chairs, small beach ball  Activity: Keep It Going Volleyball.  Patients were seated in a circle in the dayroom.  Patients were to pass the ball back and forth to each other.  Patients could bounce the balls off the floor but the ball could not come to a complete stop.  LRT counted the number of hits the patients made on the ball.  If the ball came to a stop, the count would start over.    Education: Wellness, Building control surveyorDischarge Planning.   Education Outcome: Acknowledges education/In group clarification offered/Needs additional education.   Clinical Observations/Feedback: Pt arrived late to group and sat outside the circle.  Pt would hit the ball when it came to him.  Pt would smile at times and was able to focus.    Caroll RancherMarjette Madilynne Mullan, LRT/CTRS     Lillia AbedLindsay, Taffie Eckmann A 02/17/2017 11:10 AM

## 2017-02-17 NOTE — Plan of Care (Signed)
  Activity: Sleeping patterns will improve 02/17/2017 2053 - Progressing by Delos HaringPhillips, Ivaan Liddy A, RN Note Pt slept over 6 hrs last night   Safety: Periods of time without injury will increase 02/17/2017 2053 - Progressing by Delos HaringPhillips, Harace Mccluney A, RN Note Pt safe on the unit at this time   Nutritional: Ability to achieve adequate nutritional intake will improve 02/17/2017 2053 - Progressing by Delos HaringPhillips, Caleb Decock A, RN Note Pt took medications with out issue

## 2017-02-18 NOTE — Progress Notes (Signed)
DAR NOTE: Patient presents with anxious affect and mood.  Continues to be hyper religious with tangential thought process.  Denies pain, auditory and visual hallucinations.  Seen pacing the hallway with bible in his hand.  Described energy level as low and concentration as good.  Rates depression at 3, hopelessness at 3, and anxiety at 1.  Maintained on routine safety checks.  Medications given as prescribed.  Support and encouragement offered as needed.  States goal for today is "discharge."  Patient visible in the dayroom with minimal interaction with peers.  Offered no complaint.

## 2017-02-18 NOTE — [facility] Group Notes (Signed)
[facility] Group Notes: (Clinical Social Work)   02/18/2017      Type of Therapy:  Group Therapy   Participation Level:  Did Not Attend despite MHT prompting   Ambrose MantleMareida Grossman-Orr, LCSW 02/18/2017, 1:28 PM

## 2017-02-18 NOTE — Progress Notes (Signed)
[facility] MD Progress Note  02/18/2017 1:59 PM Jack [name]  MRN:  [id number]  Subjective: [name] reports " I am feeling okay, just talking to the sprits"   Objective: Jack [name] is awake, alert. Seen pacing the unit with bible in hand. Continues to present with hyper religious thoughts and delusions. Denies suicidal or homicidal ideation. NP discussed lab collection with patient who is agreeable.   Patient interacts well with staff and others. Patient reports he is medication compliant without mediation side effects.  Reports a good appetite and states he is resting well. Support, encouragement and reassurance was provided.     Principal Problem: Schizoaffective disorder, bipolar type (HCC) Diagnosis:   Patient Active Problem List   Diagnosis Date Noted  . Bipolar affective disorder, manic, severe (HCC) [F31.13] 01/29/2017  . Agitation [R45.1] 03/09/2015  . Bipolar 1 disorder, manic, moderate (HCC) [F31.12] 03/08/2015  . Schizoaffective disorder, bipolar type (HCC) [F25.0] 01/10/2012  . Intellectual disability [F79] 01/10/2012   Total Time spent with patient: 30 minutes  Past Psychiatric History: see H&P  Past Medical History:  Past Medical History:  Diagnosis Date  . Bipolar 1 disorder (HCC)   . Intellectual disability   . Mental retardation   . Schizophrenia, schizo-affective (HCC)   . Seizures (HCC)    History reviewed. No pertinent surgical history. Family History: History reviewed. No pertinent family history. Family Psychiatric  History: see H&P Social History:  Social History   Substance and Sexual Activity  Alcohol Use No     Social History   Substance and Sexual Activity  Drug Use No    Social History   Socioeconomic History  . Marital status: Single    Spouse name: None  . Number of children: None  . Years of education: None  . Highest education level: None  Social Needs  . Financial resource strain: None  . Food insecurity - worry: None  . Food  insecurity - inability: None  . Transportation needs - medical: None  . Transportation needs - non-medical: None  Occupational History  . None  Tobacco Use  . Smoking status: Never Smoker  . Smokeless tobacco: Never Used  Substance and Sexual Activity  . Alcohol use: No  . Drug use: No  . Sexual activity: Not Currently  Other Topics Concern  . None  Social History Narrative  . None   Additional Social History:                         Sleep: Good  Appetite:  Good  Current Medications: Current Facility-Administered Medications  Medication Dose Route Frequency Provider Last Rate Last Dose  . acetaminophen (TYLENOL) tablet 650 mg  650 mg Oral Q4H PRN Charm Rings, NP   650 mg at 02/12/17 1854  . alum & mag hydroxide-simeth (MAALOX/MYLANTA) 200-200-20 MG/5ML suspension 30 mL  30 mL Oral Q4H PRN Charm Rings, NP      . cloZAPine (CLOZARIL) tablet 100 mg  100 mg Oral BID Micheal Likens, MD   100 mg at 02/18/17 0901  . cloZAPine (CLOZARIL) tablet 350 mg  350 mg Oral QHS Micheal Likens, MD   350 mg at 02/17/17 2039  . divalproex (DEPAKOTE) DR tablet 2,000 mg  2,000 mg Oral Daily Micheal Likens, MD   2,000 mg at 02/17/17 1637  . docusate sodium (COLACE) capsule 100 mg  100 mg Oral Daily PRN Charm Rings, NP      .  gabapentin (NEURONTIN) capsule 300 mg  300 mg Oral TID Micheal Likensainville, [name] T, MD   300 mg at 02/18/17 1209  . hydrOXYzine (ATARAX/VISTARIL) tablet 50 mg  50 mg Oral Q6H PRN Micheal Likensainville, [name] T, MD   50 mg at 02/17/17 2039  . lithium carbonate (ESKALITH) CR tablet 900 mg  900 mg Oral Daily Micheal Likens[place], [name] T, MD   900 mg at 02/17/17 1637  . LORazepam (ATIVAN) tablet 1 mg  1 mg Oral TID Micheal Likens[place], [name] T, MD   1 mg at 02/18/17 0901  . magnesium hydroxide (MILK OF MAGNESIA) suspension 30 mL  30 mL Oral Daily PRN Charm RingsLord, Jamison Y, NP      . magnesium oxide (MAG-OX) tablet 400 mg  400 mg Oral Daily Charm RingsLord, Jamison Y,  NP   400 mg at 02/18/17 0901  . OLANZapine (ZYPREXA) tablet 10 mg  10 mg Oral Q8H PRN Micheal Likensainville, [name] T, MD   10 mg at 02/12/17 2148    Lab Results: No results found for this or any previous visit (from the past 48 hour(s)).  Blood Alcohol level:  Lab Results  Component Value Date   ETH <10 01/27/2017   ETH <5 03/07/2015    Metabolic Disorder Labs: Lab Results  Component Value Date   HGBA1C 5.0 01/31/2017   MPG 96.8 01/31/2017   Lab Results  Component Value Date   PROLACTIN 10.4 01/31/2017   Lab Results  Component Value Date   CHOL 128 01/31/2017   TRIG 105 01/31/2017   HDL 47 01/31/2017   CHOLHDL 2.7 01/31/2017   VLDL 21 01/31/2017   LDLCALC 60 01/31/2017    Physical Findings: AIMS: Facial and Oral Movements Muscles of Facial Expression: None, normal Lips and Perioral Area: None, normal Jaw: None, normal Tongue: None, normal,Extremity Movements Upper (arms, wrists, hands, fingers): None, normal Lower (legs, knees, ankles, toes): None, normal, Trunk Movements Neck, shoulders, hips: None, normal, Overall Severity Severity of abnormal movements (highest score from questions above): None, normal Incapacitation due to abnormal movements: None, normal Patient's awareness of abnormal movements (rate only patient's report): No Awareness, Dental Status Current problems with teeth and/or dentures?: No Does patient usually wear dentures?: No  CIWA:    COWS:     Musculoskeletal: Strength & Muscle Tone: within normal limits Gait & Station: normal Patient leans: N/A  Psychiatric Specialty Exam: Physical Exam  Nursing note and vitals reviewed. Constitutional: He appears well-developed.  Psychiatric: He has a normal mood and affect. His behavior is normal.    Review of Systems  Psychiatric/Behavioral: Positive for hallucinations.    Blood pressure (!) 145/69, pulse (!) 127, temperature 98.8 F (37.1 C), temperature source Oral, resp. rate 16, height 5\' 11"   (1.803 m), weight 81.6 kg (180 lb), SpO2 99 %.Body mass index is 25.1 kg/m.  General Appearance: Casual and Fairly Groomed  Eye Contact:  Good  Speech:  Clear and Coherent  Volume:  Normal  Mood:  Euthymic  Affect:  Appropriate, Blunt, Congruent and Labile  Thought Process:  Coherent, Disorganized, Goal Directed and Descriptions of Associations: Loose  Orientation:  Full (Time, Place, and Person)  Thought Content:  Delusions, Hallucinations: Auditory Visual, Ideas of Reference:   Delusions and Obsessions  Suicidal Thoughts:  No  Homicidal Thoughts:  No  Memory:  Immediate;   Good Recent;   Good Remote;   Good  Judgement:  Impaired  Insight:  Fair  Psychomotor Activity:  Normal  Concentration:  Concentration: Fair  Recall:  FiservFair  Fund  of Knowledge:  Fair  Language:  Fair  Akathisia:  No  Handed:    AIMS (if indicated):     Assets:  Manufacturing systems engineerCommunication Skills Physical Health Resilience Social Support  ADL's:  Intact  Cognition:  WNL  Sleep:  Number of Hours: 6     Treatment Plan Summary: Daily contact with patient to assess and evaluate symptoms and progress in treatment and Medication management.  Continue with current treatment plan on 02/18/2017 except where noted.  - Schizoaffective disorder bipolar type:  -  Continue clozapine 100 mg po BID 100mg  + 300mg  qhs. - Continue lithium 900mg  qDaily at 1700 (level 0.93 on 11/24) - Continue  depakote 2000mg  qDaily at 1700 (level 107 on 11/29)  -Continuegabapentin 300mg  TID for agitation - Continue ativan 1mg  TID at 0800, 1700, and 2200    - Continuehaldol 5mg  q8h prn agitation (or geodon 20mg  IM q12h prn agitation if pt refuses oral medications)   -Labs pending: repeat EKG, Lithium level, Depakote and CBC level pending results.  - Encourage participation in groups and the therapeutic milieu - Discharge planning will be ongoing    Oneta Rackanika N Lewis,  NP 02/18/2017, 1:59 PM   Agree with NP Progress Note

## 2017-02-18 NOTE — [facility] Group Notes (Deleted)
Adult Psychoeducational Group Note  Date:  02/18/2017 Time:  8:54 PM  Group Topic/Focus:  Wrap-Up Group:   The focus of this group is to help patients review their daily goal of treatment and discuss progress on daily workbooks.  Participation Level:  Active  Participation Quality:  Appropriate and Attentive  Affect:  Appropriate  Cognitive:  Alert and Appropriate  Insight: Appropriate  Engagement in Group:  Engaged  Modes of Intervention:  Discussion  Additional Comments:  Pt was attentive and appropriate during tonight's group discussion. Pt was able to share that today was a good day and he was able to work on some things.   Bing PlumeScott, Adaiah Jaskot D 02/18/2017, 8:54 PM

## 2017-02-18 NOTE — Progress Notes (Signed)
Nursing Progress Note: 7p-7a D: Pt currently presents with a anxiety/depression/circumstanital/disorganized affect and behavior. Pt states "I have just had some thoughts of hurting myself earlier. Nothing right now. I just feel kind of down right now. Isn't still November? I'm ready for thanksgiving." Interacting appropriately with the milieu. Pt reports good sleep during the previous night with current medication regimen.   A: Pt provided with medications per providers orders. Pt's labs and vitals were monitored throughout the night. Pt supported emotionally and encouraged to express concerns and questions. Pt educated on medications.  R: Pt's safety ensured with 15 minute and environmental checks. Pt currently denies SI, HI, and AVH at this time. Pt verbally contracts to seek staff if SI,HI, or AVH occurs and to consult with staff before acting on any harmful thoughts. Will continue to monitor.

## 2017-02-19 NOTE — Progress Notes (Signed)
Nursing Note 02/19/2017 [id number]  Data Reports sleeping good without PRN sleep med.  Rates depression 5/10, hopelessness 0/10, and anxiety 5/10. Affect animated.  Denies HI, SI, AVH.  Patient reported "there's a pressure from the spirit on my chest," reported burning and pointed at epigastric region.  Also reported no substantial BM for days.  Given Maalox/milk of mag and reported total relief- but abdomen bloated.  Seen in day area, interacts with staff and peers.  Some of speech intermittently has religious content.  Observed with bible in arm most of day.    Pleasant.  Action Spoke with patient 1:1, nurse offered support to patient throughout shift.  NP aware of intervention/response from epigastric burning.  Continues to be monitored on 15 minute checks for safety.  Response Remains safe on unit.

## 2017-02-19 NOTE — [facility] Group Notes (Signed)
[facility]  Date/Time:  02/19/2017  11:00AM-12:00PM  Type of Therapy and Topic:  Group Therapy:  Music and Mood  Participation Level:  Active   Description of Group: In this process group, members listened to a variety of genres of music and identified that different types of music evoke different responses.  Patients were encouraged to identify music that was soothing for them and music that was energizing for them.  Patients discussed how this knowledge can help with wellness and recovery in various ways including managing depression and anxiety as well as encouraging healthy sleep habits.    Therapeutic Goals: 1. Patients will explore the impact of different varieties of music on mood 2. Patients will verbalize the thoughts they have when listening to different types of music 3. Patients will identify music that is soothing to them as well as music that is energizing to them 4. Patients will discuss how to use this knowledge to assist in maintaining wellness and recovery 5. Patients will explore the use of music as a coping skill  Summary of Patient Progress:  At the beginning of group, patient expressed that he felt tired.  He got up and danced several times and at the end of group said he felt "healed" and "better."  Therapeutic Modalities: Solution Focused Brief Therapy Motivational Interviewing Activity   Ambrose MantleMareida Grossman-Orr, LCSW 02/19/2017 12:27 PM

## 2017-02-19 NOTE — Progress Notes (Signed)
Writer has observed patient in his room most of the night. He came out of his room for snacks and medication and returned to his room. He reports that he is tired tonight. Support given and safety maintained on unit with 15 min checks.

## 2017-02-19 NOTE — Progress Notes (Signed)
[facility]  02/19/2017 11:42 AM Jack HoseGary T Arvizu  MRN:  [id number]    Objective: Jack HoseGary T Siharath seen resting in dayroom. Smiling. pleasant and cooperative. Cardell PeachGay has reports he feels ready to go back to his group home.   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 Patient mood appears to be stable.   Denies suicidal or homicidal ideation during this assessment. Clozaril, Lithium and Valproic aide level- pending results on 12/16. Support, encouragement and reassurance was provided.     Principal Problem: Schizoaffective disorder, bipolar type (HCC) Diagnosis:   Patient Active Problem List   Diagnosis Date Noted  . Bipolar affective disorder, manic, severe (HCC) [F31.13] 01/29/2017  . Agitation [R45.1] 03/09/2015  . Bipolar 1 disorder, manic, moderate (HCC) [F31.12] 03/08/2015  . Schizoaffective disorder, bipolar type (HCC) [F25.0] 01/10/2012  . Intellectual disability [F79] 01/10/2012   Total Time spent with patient: 30 minutes  Past Psychiatric History: see H&P  Past Medical History:  Past Medical History:  Diagnosis Date  . Bipolar 1 disorder (HCC)   . Intellectual disability   . Mental retardation   . Schizophrenia, schizo-affective (HCC)   . Seizures (HCC)    History reviewed. No pertinent surgical history. Family History: History reviewed. No pertinent family history. Family Psychiatric  History: see H&P Social History:  Social History   Substance and Sexual Activity  Alcohol Use No     Social History   Substance and Sexual Activity  Drug Use No    Social History   Socioeconomic History  . Marital status: Single    Spouse name: None  . Number of children: None  . Years of education: None  . Highest education level: None  Social Needs  . Financial resource strain: None  . Food insecurity - worry: None  . Food insecurity - inability: None  . Transportation needs - medical: None  . Transportation needs -  non-medical: None  Occupational History  . None  Tobacco Use  . Smoking status: Never Smoker  . Smokeless tobacco: Never Used  Substance and Sexual Activity  . Alcohol use: No  . Drug use: No  . Sexual activity: Not Currently  Other Topics Concern  . None  Social History Narrative  . None   Additional Social History:                         Sleep: Good  Appetite:  Good  Current Medications: Current Facility-Administered Medications  Medication Dose Route Frequency Provider Last Rate Last Dose  . acetaminophen (TYLENOL) tablet 650 mg  650 mg Oral Q4H PRN Charm RingsLord, Jamison Y, NP   650 mg at 02/12/17 1854  . alum & mag hydroxide-simeth (MAALOX/MYLANTA) 200-200-20 MG/5ML suspension 30 mL  30 mL Oral Q4H PRN Charm RingsLord, Jamison Y, NP      . cloZAPine (CLOZARIL) tablet 100 mg  100 mg Oral BID Micheal Likens[place], [name] T, MD   100 mg at 02/19/17 0910  . cloZAPine (CLOZARIL) tablet 350 mg  350 mg Oral QHS Micheal Likens[place], [name] T, MD   350 mg at 02/18/17 2017  . divalproex (DEPAKOTE) DR tablet 2,000 mg  2,000 mg Oral Daily Micheal Likens[place], [name] T, MD   2,000 mg at 02/18/17 1630  . docusate sodium (COLACE) capsule 100 mg  100 mg Oral Daily PRN Charm RingsLord, Jamison Y, NP      . gabapentin (NEURONTIN) capsule 300 mg  300 mg Oral TID Micheal Likens[place], [name] T,  MD   300 mg at 02/19/17 0911  . hydrOXYzine (ATARAX/VISTARIL) tablet 50 mg  50 mg Oral Q6H PRN Micheal Likens, MD   50 mg at 02/18/17 2017  . lithium carbonate (ESKALITH) CR tablet 900 mg  900 mg Oral Daily Micheal Likens, MD   900 mg at 02/18/17 1630  . LORazepam (ATIVAN) tablet 1 mg  1 mg Oral TID Micheal Likens, MD   1 mg at 02/19/17 0910  . magnesium hydroxide (MILK OF MAGNESIA) suspension 30 mL  30 mL Oral Daily PRN Charm Rings, NP      . magnesium oxide (MAG-OX) tablet 400 mg  400 mg Oral Daily Charm Rings, NP   400 mg at 02/19/17 0911  . OLANZapine (ZYPREXA) tablet 10 mg  10 mg Oral Q8H PRN Micheal Likens, MD   10 mg at 02/12/17 2148    Lab Results: No results found for this or any previous visit (from the past 48 hour(s)).  Blood Alcohol level:  Lab Results  Component Value Date   ETH <10 01/27/2017   ETH <5 03/07/2015    Metabolic Disorder Labs: Lab Results  Component Value Date   HGBA1C 5.0 01/31/2017   MPG 96.8 01/31/2017   Lab Results  Component Value Date   PROLACTIN 10.4 01/31/2017   Lab Results  Component Value Date   CHOL 128 01/31/2017   TRIG 105 01/31/2017   HDL 47 01/31/2017   CHOLHDL 2.7 01/31/2017   VLDL 21 01/31/2017   LDLCALC 60 01/31/2017    Physical Findings: AIMS: Facial and Oral Movements Muscles of Facial Expression: None, normal Lips and Perioral Area: None, normal Jaw: None, normal Tongue: None, normal,Extremity Movements Upper (arms, wrists, hands, fingers): None, normal Lower (legs, knees, ankles, toes): None, normal, Trunk Movements Neck, shoulders, hips: None, normal, Overall Severity Severity of abnormal movements (highest score from questions above): None, normal Incapacitation due to abnormal movements: None, normal Patient's awareness of abnormal movements (rate only patient's report): No Awareness, Dental Status Current problems with teeth and/or dentures?: No Does patient usually wear dentures?: No  CIWA:    COWS:     Musculoskeletal: Strength & Muscle Tone: within normal limits Gait & Station: normal Patient leans: N/A  Psychiatric Specialty Exam: Physical Exam  Nursing note and vitals reviewed. Constitutional: He appears well-developed.  Psychiatric: He has a normal mood and affect. His behavior is normal.    Review of Systems  Psychiatric/Behavioral: Positive for hallucinations.    Blood pressure 108/77, pulse (!) 103, temperature 98.3 F (36.8 C), resp. rate 20, height 5\' 11"  (1.803 m), weight 81.6 kg (180 lb), SpO2 99 %.Body mass index is 25.1 kg/m.  General Appearance: Casual and Fairly Groomed  Eye  Contact:  Good  Speech:  Clear and Coherent  Volume:  Normal  Mood:  Euthymic  Affect:  Appropriate, Blunt, Congruent and Labile  Thought Process:  Coherent, Disorganized, Goal Directed and Descriptions of Associations: Loose  Orientation:  Full (Time, Place, and Person)  Thought Content:  Delusions, Hallucinations: Auditory Visual, Ideas of Reference:   Delusions and Obsessions  Suicidal Thoughts:  No  Homicidal Thoughts:  No  Memory:  Immediate;   Good Recent;   Good Remote;   Good  Judgement:  Impaired  Insight:  Fair  Psychomotor Activity:  Normal  Concentration:  Concentration: Fair  Recall:  [employer] of Knowledge:  Fair  Language:  Fair  Akathisia:  No  Handed:  AIMS (if indicated):     Assets:  Communication Skills Physical Health Resilience Social Support  ADL's:  Intact  Cognition:  WNL  Sleep:  Number of Hours: 6.5     Treatment Plan Summary: Daily contact with patient to assess and evaluate symptoms and progress in treatment and Medication management.  Continue with current treatment plan on 02/19/2017 except where noted.  - Schizoaffective disorder bipolar type:  -  Continue clozapine 100 mg po BID 100mg  + 300mg  qhs. - Continue lithium 900mg  qDaily at 1700 (level 0.93 on 11/24) - Continue  depakote 2000mg  qDaily at 1700 (level 107 on 11/29)  -Continuegabapentin 300mg  TID for agitation - Continue ativan 1mg  TID at 0800, 1700, and 2200    - Continuehaldol 5mg  q8h prn agitation (or geodon 20mg  IM q12h prn agitation if pt refuses oral medications)   -Labs pending: repeat EKG, Lithium level, Depakote and CBC level pending results on 02/19/2017  - Encourage participation in groups and the therapeutic milieu - Discharge planning will be ongoing    Oneta Rackanika N Lewis, NP 02/19/2017, 11:42 AM Agree with NP Progress Note

## 2017-02-20 ENCOUNTER — Ambulatory Visit (HOSPITAL_COMMUNITY): Payer: Medicare Other | Admitting: Psychiatry

## 2017-02-20 MED ORDER — GABAPENTIN 300 MG PO CAPS: 300.0000 mg | ORAL_CAPSULE | Freq: Three times a day (TID) | ORAL | 0 refills | Status: AC

## 2017-02-20 MED ORDER — LORAZEPAM 1 MG PO TABS: ORAL_TABLET | ORAL | 0 refills | Status: DC

## 2017-02-20 MED ORDER — CLOZAPINE 50 MG PO TABS: 350.0000 mg | ORAL_TABLET | Freq: Every day | ORAL | 0 refills | Status: DC

## 2017-02-20 MED ORDER — DIVALPROEX SODIUM 500 MG PO DR TAB: 2000.0000 mg | DELAYED_RELEASE_TABLET | Freq: Every day | ORAL | 0 refills | Status: AC

## 2017-02-20 MED ORDER — HYDROXYZINE HCL 50 MG PO TABS: 50.0000 mg | ORAL_TABLET | Freq: Four times a day (QID) | ORAL | 0 refills | Status: DC | PRN

## 2017-02-20 MED ORDER — CLOZAPINE 100 MG PO TABS: 100.0000 mg | ORAL_TABLET | Freq: Two times a day (BID) | ORAL | 0 refills | Status: DC

## 2017-02-20 MED ORDER — LITHIUM CARBONATE ER 450 MG PO TBCR: 900.0000 mg | EXTENDED_RELEASE_TABLET | Freq: Every day | ORAL | 0 refills | Status: AC

## 2017-02-20 MED ORDER — DOK 100 MG PO CAPS: ORAL_CAPSULE | ORAL | 0 refills | Status: DC

## 2017-02-20 MED ORDER — MAGNESIUM OXIDE 400 (241.3 MG) MG PO TABS: 400.0000 mg | ORAL_TABLET | Freq: Every day | ORAL | 0 refills | Status: DC

## 2017-02-20 NOTE — [place] FL2 (Signed)
[place] MEDICAID FL2 LEVEL OF CARE SCREENING TOOL     IDENTIFICATION  Patient Name: Jack [name] Birthdate: 1985/04/23 Sex: male Admission Date (Current Location): 01/29/2017  [facility] [place]   Facility and Address:  [facility] [address]  [place] [address])      Provider Number: [id number]  Attending Physician Name and Address:  Micheal Likensainville, [name] T*  Relative Name and Phone Number:  Ms Jenne CampusMcQueen [phone]    Current Level of Care: Hospital Recommended Level of Care: Family Care Home Prior Approval Number:    Date Approved/Denied:   PASRR Number:    Discharge Plan: [facility]omiciliary (Rest home[facility]    Current Diagnoses: Patient Active Problem List   Diagnosis Date Noted  . Bipolar affective disorder, manic, severe (HCC) 01/29/2017  . Agitation 03/09/2015  . Bipolar 1 disorder, manic, moderate (HCC) 03/08/2015  . Schizoaffective disorder, bipolar type (HCC) 01/10/2012  . Intellectual disability 01/10/2012    Orientation RESPIRATION BLADDER Height & Weight     Self, Situation, Place  Normal Continent Weight: 180 lb (81.6 kg) Height:  5\' 11"  (180.3 cm)  BEHAVIORAL SYMPTOMS/MOOD NEUROLOGICAL BOWEL NUTRITION STATUS  (None) (None) Continent (Regular diet)  AMBULATORY STATUS COMMUNICATION OF NEEDS Skin   Independent Verbally Normal                       Personal Care Assistance Level of Assistance  Bathing, Feeding, Dressing Bathing Assistance: Independent Feeding assistance: Independent Dressing Assistance: Independent     Functional Limitations Info  (None)          SPECIAL CARE FACTORS FREQUENCY  (None)                    Contractures Contractures Info: Not present    Additional Factors Info  (None)               Current Medications (02/20/2017):  This is the current hospital active medication list  * cloZAPine 100 MG tablet  Commonly known as: CLOZARIL  Take 1 tablet (100 mg  total) by mouth 2 (two) times daily. For mood control  What changed:   0 when to take this  0 additional instructions  0 Another medication with the same name was removed. Continue taking this medication, and follow the directions you see here.  For: Mood control  Next does due:          * clozapine 50 MG tablet  Commonly known as: CLOZARIL  Take 7 tablets (350 mg total) by mouth at bedtime. For mood control  What changed:   0 medication strength  0 how much to take  0 additional instructions  0 Another medication with the same name was removed. Continue taking this medication, and follow the directions you see here.  For: Mood control  Next does due:          divalproex 500 MG DR tablet  Commonly known as: DEPAKOTE  Take 4 tablets (2,000 mg total) by mouth daily. For mood stabilization/seizures  What changed:   0 how much to take  0 when to take this  0 additional instructions  For: Mood stabilization/epilepsy  Next does due:          DOK 100 MG capsule  TK 1 Capsul PO BID AS NEEDED FOR CONSTIPATION  What changed: See the new instructions.  For: Constipation  Generic drug: docusate sodium  Next does due:  gabapentin 300 MG capsule  Commonly known as: NEURONTIN  Take 1 capsule (300 mg total) by mouth 3 (three) times daily. For agitation  For: Agitation  Next does due:          hydrOXYzine 50 MG tablet  Commonly known as: ATARAX/VISTARIL  Take 1 tablet (50 mg total) by mouth every 6 (six) hours as needed for anxiety.  For: Feeling Anxious  Next does due:          lithium carbonate 450 MG CR tablet  Commonly known as: ESKALITH  Take 2 tablets (900 mg total) by mouth daily. For mood stabilization  What changed:   0 medication strength  0 See the new instructions.  0 Another medication with the same name was removed. Continue taking this medication, and follow the directions you see here.  For: Mood stabilization  Next does due:          LORazepam 1 MG tablet   Commonly known as: ATIVAN  Take 1 Tablet PO TID for anxiety  What changed: See the new instructions.  For: Anxiousness associated with Depression  Next does due:          magnesium oxide 400 (241.3 Mg) MG tablet  Commonly known as: MAG-OX  Start taking on: 02/21/2017  Take 1 tablet (400 mg total) by mouth daily. For constipation  For: Constipation  Replaces: magnesium oxide 400 MG tablet            Discharge Medications: Please see discharge summary for a list of discharge medications.  Relevant Imaging Results:  Relevant Lab Results:   Additional Information    Ida Rogueodney B Deondra Wigger, LCSW

## 2017-02-20 NOTE — Progress Notes (Addendum)
D:  Jack [name] was in his room much of the morning.  He did come out for group and to go to lunch.  He reported that he had a BM yesterday and voiced no complaints.  Abdomen was soft on palpation.  He denies any pain or discomfort and appears to be in no physical distress.  He continues to remain hyper religious.  He has been pleasant and cooperative.  He denies SI/HI but does admit to hearing voices of the spirit.  Minimal interaction with peers but will come up and talk to staff.  He completed his self inventory and reported that His depression, hopelessness and anxiety are 0/10.  He stated his goal for today was "get out of here" and he will accomplish his goal by "taking care of myself."   A:  1:1 with RN for support and encouragement.  Medications as ordered.  Q 15 minute checks maintained for safety.  Encouraged participation in group and unit activities. R:  Jack [name] remains safe on the unit.  We will continue to monitor the progress towards his goals.

## 2017-02-20 NOTE — Progress Notes (Signed)
Recreation Therapy Notes  Date: 02/20/17 Time: 1000 Location: 500 Hall Dayroom  Group Topic: Coping Skills  Goal Area(s) Addresses:  Patient will be able to identify positive coping skills. Patient will be able to identify benefits of using coping skills. Patient will be able to identify benefits of using coping skills post d/c.  Behavioral Response: Engaged  Intervention: Mind map, pencils, white board, marker, eraser  Activity: Mind map.  Patients and LRT filled in the first eight boxes of the mind map together with depression, anxiety, hygiene, wellness, family, withdrawal, work and social interaction.  Patients were to then identify three coping skills for each of the situations identified.  The group would then reconvene and coping skills would be written on the board.   Education: PharmacologistCoping Skills, Building control surveyorDischarge Planning.   Education Outcome: Acknowledges understanding/In group clarification offered/Needs additional education.   Clinical Observations/Feedback: Pt was still religiously focused.  Pt identified his coping skills as prayer and music.    Caroll RancherMarjette Loie Jahr, LRT/CTRS    Caroll RancherLindsay, Aliviah Spain A 02/20/2017 12:09 PM

## 2017-02-20 NOTE — Progress Notes (Signed)
  [facility] :  Will you be returning to the same living situation after discharge:  Yes,  [facility] At discharge, do you have transportation home?: Yes,  mother Do you have the ability to pay for your medications: Yes,  MCD  Release of information consent forms completed and in the chart;  Patient's signature needed at discharge.  Patient to Follow up at: Follow-up Information    [facility] Follow up on 05/11/2017.   Specialty:  Behavioral Health Why:  Thursday at 10;30 with Dr Vincent PeyerEksir Contact information: [address] [place] [id number] [phone]       [facility] Follow up on 03/08/2017.   Why:  Wednesday at 11:20 for your hospital follow up appointment Contact information: [address] [place] [address] [phone]           Next level of care provider has [facility] to [facility] Link:no  Safety Planning and Suicide Prevention discussed: Yes,  yes  Have you used any form of tobacco in the last 30 days? (Cigarettes, Smokeless Tobacco, Cigars, and/or Pipes): No  Has patient been referred to the Quitline?: N/A patient is not a smoker  Patient has been referred for addiction treatment: N/A  Jack RogueRodney B Karmon Andis, LCSW 02/20/2017, 4:03 PM

## 2017-02-20 NOTE — Progress Notes (Signed)
[facility]  No distress noted.  No complaints voiced.  Pt verbalized understanding of d/c instructions.  Pt leaving with prescriptions and all belongings.  Pt denied SI.

## 2017-02-20 NOTE — Plan of Care (Signed)
Pt was able to identify coping skills at completion of coping skills recreation therapy sessions.  Kimbrely Buckel, LRT/CTRS 

## 2017-02-20 NOTE — Progress Notes (Signed)
Recreation Therapy Notes  INPATIENT RECREATION TR PLAN  Patient Details Name: Jack [name] MRN: [id number] DOB: 08/21/1985 Today's Date: 02/20/2017  Rec Therapy Plan Is patient appropriate for Therapeutic Recreation?: Yes Treatment times per week: about 3 days Estimated Length of Stay: 5-7 days TR Treatment/Interventions: Group participation (Comment)  Discharge Criteria Pt will be discharged from therapy if:: Discharged Treatment plan/goals/alternatives discussed and agreed upon by:: Patient/family  Discharge Summary Short term goals set: Pt will be able to identify at least 5 coping skills for hallucinations.  Short term goals met: Complete Progress toward goals comments: Groups attended Which groups?: Coping skills, Wellness, Goal setting, Communication, Anger management, Leisure education Reason goals not met: None Therapeutic equipment acquired: N/A Reason patient discharged from therapy: Discharge from hospital Pt/family agrees with progress & goals achieved: Yes Date patient discharged from therapy: 02/20/17      , LRT/CTRS  ,  A 02/20/2017, 1:54 PM  

## 2017-02-20 NOTE — [facility] Suicide Risk Assessment (Signed)
[facility]   Principal Problem: Schizoaffective disorder, bipolar type [facility](HCC) Discharge Diagnoses:  Patient Active Problem List   Diagnosis Date Noted  . Bipolar affective disorder, manic, severe (HCC) [F31.13] 01/29/2017  . Agitation [R45.1] 03/09/2015  . Bipolar 1 disorder, manic, moderate (HCC) [F31.12] 03/08/2015  . Schizoaffective disorder, bipolar type (HCC) [F25.0] 01/10/2012  . Intellectual disability [F79] 01/10/2012    Total Time spent with patient: 30 minutes  Musculoskeletal: Strength & Muscle Tone: within normal limits Gait & Station: normal Patient leans: N/A  Psychiatric Specialty Exam: Review of Systems  Constitutional: Negative for fever.  Respiratory: Negative for cough and sputum production.   Cardiovascular: Negative for chest pain.  Gastrointestinal: Negative for heartburn.  Psychiatric/Behavioral: Negative for depression, hallucinations, substance abuse and suicidal ideas. The patient is not nervous/anxious.     Blood pressure (!) 155/87, pulse 90, temperature 98.2 F (36.8 C), temperature source Oral, resp. rate 20, height 5\' 11"  (1.803 m), weight 81.6 kg (180 lb), SpO2 99 %.Body mass index is 25.1 kg/m.  General Appearance: Casual and Fairly Groomed  Eye Contact::  Good  Speech:  Clear and Coherent and Normal Rate  Volume:  Normal  Mood:  Euphoric  Affect:  Appropriate, Blunt and Congruent  Thought Process:  Coherent and Goal Directed  Orientation:  Full (Time, Place, and Person)  Thought Content:  Logical  Suicidal Thoughts:  No  Homicidal Thoughts:  No  Memory:  Immediate;   Good Recent;   Good Remote;   Good  Judgement:  Fair  Insight:  Fair  Psychomotor Activity:  Normal  Concentration:  Fair  Recall:  Fair  Fund of Knowledge:Fair  Language: Fair  Akathisia:  No  Handed:    AIMS (if indicated):     Assets:  Communication Skills Resilience Social Support Talents/Skills  Sleep:  Number of Hours: 6.75   Cognition: WNL  ADL's:  Intact   Mental Status Per Nursing Assessment::   On Admission:  NA  Demographic Factors:  Male, Gay, lesbian, or bisexual orientation, Low socioeconomic status and Unemployed  Loss Factors: NA  Historical Factors: Impulsivity  Risk Reduction Factors:   Living with another person, especially a relative, Positive social support, Positive therapeutic relationship and Positive coping skills or problem solving skills  Continued Clinical Symptoms:  Severe Anxiety and/or Agitation Schizophrenia:   Paranoid or undifferentiated type More than one psychiatric diagnosis Previous Psychiatric Diagnoses and Treatments  Cognitive Features That Contribute To Risk:  None    Suicide Risk:  Minimal: No identifiable suicidal ideation.  Patients presenting with no risk factors but with morbid ruminations; may be classified as minimal risk based on the severity of the depressive symptoms  Follow-up Information    [facility] Follow up on 05/11/2017.   Specialty:  Behavioral Health Why:  Thursday at 10;30 with Dr Vincent PeyerEksir Contact information: [address] [place] [id number] [phone]       [facility] Follow up on 03/08/2017.   Why:  Wednesday at 11:20 for your hospital follow up appointment Contact information: [address] [place] [address] [phone]         Subjective Data: Jack [name] is a 31 y/o with history of schizoaffective disorder bipolar type and intellectual disability who was admitted with worsening symptoms of psychosis, agitation, religious preoccupation/delusions, andmania. Pt has grandiose delusionsthat he isa"reverend"chosenby God toinherit a church. Pt also has belief that he is able to communicate withthe spirit of people,and  he appears to respond to internal stimuli by having conversations with himself.He was restarted on previous medications of depakote, lithium,  and clozapine. Hisdoseswereincreased, and he has been monitored on the inpatient psychiatry unit.He was also started on scheduled Ativan to address symptoms of agitation. Pt has been improving incrementally while on the inpatient unit.  Today upon evaluation, pt reports he is doing well and he states, he has "No problems at all." He denies SI/HI/AH/VH, but he does appear to respond to the spirit of someone during the interview (which he confirms is what he is doing) after appearing to respond to internal stimuli. Pt reports he is sleeping well and his appetite is good. He is tolerating his current medication regimen without difficulty or side effects. Staff member, Julieanne CottonJosephine, from pt's group home came to visit and reported that pt is doing well and appears stable for discharge back to home. We discussed finding opportunities for Jack [name] to have some of the activities he enjoys such as going to [employer] and [place] discussed about how they will follow up on that request. Pt also would like to switch his group home house, and there may be another home run by the same staff that is available for Jack [name] to switch to. Pt was able to engage in safety planning including [facility] if he feels unable to maintain his own safety or the safety of others. Pt had no further questions, comments, or concerns.   Plan Of Care/Follow-up recommendations:   - Discharge to outpatient level of care (ACT team)  - Schizoaffective disorder bipolar type:  -  Continue clozapine 100 mg po BID 100mg  + 350mg  qhs. - Continuelithium 900mg  qDaily at 1700 (level 0.93 on 11/24) - Continuedepakote 2000mg  qDaily at 1700 (level 107 on 11/29)  -Continuegabapentin 300mg  TID for agitation - Continue ativan 1mg  TID at 0800, 1700, and 2200  Activity:  as tolerated Diet:  normal Tests:  depakote, lithium, and clozapine levels as outpatient Other:   NA  Micheal Likenshristopher T Kiah Vanalstine, MD 02/20/2017, 12:19 PM

## 2017-02-21 ENCOUNTER — Encounter (HOSPITAL_COMMUNITY): Payer: Self-pay | Admitting: Nurse Practitioner

## 2017-02-21 ENCOUNTER — Other Ambulatory Visit: Payer: Self-pay

## 2017-02-21 ENCOUNTER — Emergency Department (HOSPITAL_COMMUNITY)
Admission: EM | Admit: 2017-02-21 | Discharge: 2017-02-21 | Disposition: A | Discharge status: Home / Self Care | Payer: Medicare Other | Attending: Emergency Medicine | Admitting: Emergency Medicine

## 2017-02-21 DIAGNOSIS — F250 Schizoaffective disorder, bipolar type: Secondary | ICD-10-CM | POA: Diagnosis not present

## 2017-02-21 DIAGNOSIS — Z91040 Latex allergy status: Secondary | ICD-10-CM | POA: Diagnosis not present

## 2017-02-21 DIAGNOSIS — Z79899 Other long term (current) drug therapy: Secondary | ICD-10-CM | POA: Insufficient documentation

## 2017-02-21 DIAGNOSIS — F79 Unspecified intellectual disabilities: Secondary | ICD-10-CM | POA: Diagnosis not present

## 2017-02-21 DIAGNOSIS — R4689 Other symptoms and signs involving appearance and behavior: Secondary | ICD-10-CM | POA: Diagnosis present

## 2017-02-21 LAB — ETHANOL

## 2017-02-21 LAB — COMPREHENSIVE METABOLIC PANEL
ALK PHOS: 53 U/L (ref 38–126)
ALT: 10 U/L — AB (ref 17–63)
AST: 14 U/L — AB (ref 15–41)
Albumin: 4.3 g/dL (ref 3.5–5.0)
Anion gap: 5 (ref 5–15)
BILIRUBIN TOTAL: 0.4 mg/dL (ref 0.3–1.2)
BUN: 13 mg/dL (ref 6–20)
CALCIUM: 9.7 mg/dL (ref 8.9–10.3)
CO2: 29 mmol/L (ref 22–32)
CREATININE: 0.81 mg/dL (ref 0.61–1.24)
Chloride: 108 mmol/L (ref 101–111)
Glucose, Bld: 91 mg/dL (ref 65–99)
Potassium: 4.3 mmol/L (ref 3.5–5.1)
Sodium: 142 mmol/L (ref 135–145)
TOTAL PROTEIN: 7.5 g/dL (ref 6.5–8.1)

## 2017-02-21 LAB — RAPID URINE DRUG SCREEN, HOSP PERFORMED
AMPHETAMINES: NOT DETECTED
Barbiturates: NOT DETECTED
Benzodiazepines: POSITIVE — AB
Cocaine: NOT DETECTED
Opiates: NOT DETECTED
Tetrahydrocannabinol: NOT DETECTED

## 2017-02-21 LAB — CBC
HCT: 36.1 % — ABNORMAL LOW (ref 39.0–52.0)
Hemoglobin: 11.9 g/dL — ABNORMAL LOW (ref 13.0–17.0)
MCH: 29.8 pg (ref 26.0–34.0)
MCHC: 33 g/dL (ref 30.0–36.0)
MCV: 90.3 fL (ref 78.0–100.0)
PLATELETS: 227 10*3/uL (ref 150–400)
RBC: 4 MIL/uL — AB (ref 4.22–5.81)
RDW: 13.1 % (ref 11.5–15.5)
WBC: 8.8 10*3/uL (ref 4.0–10.5)

## 2017-02-21 LAB — DIFFERENTIAL
BASOS ABS: 0 10*3/uL (ref 0.0–0.1)
Basophils Relative: 0 %
EOS ABS: 0.1 10*3/uL (ref 0.0–0.7)
EOS PCT: 1 %
LYMPHS ABS: 3.3 10*3/uL (ref 0.7–4.0)
Lymphocytes Relative: 39 %
MONO ABS: 0.6 10*3/uL (ref 0.1–1.0)
MONOS PCT: 7 %
NEUTROS ABS: 4.4 10*3/uL (ref 1.7–7.7)
NEUTROS PCT: 53 %

## 2017-02-21 LAB — ACETAMINOPHEN LEVEL: Acetaminophen (Tylenol), Serum: <10 ug/mL — ABNORMAL LOW (ref 10–30)

## 2017-02-21 LAB — SALICYLATE LEVEL: Salicylate Lvl: <7 mg/dL (ref 2.8–30.0)

## 2017-02-21 NOTE — Discharge Instructions (Signed)
Follow-up with your psychiatrist, and primary care provider, as planned.

## 2017-02-21 NOTE — ED Triage Notes (Signed)
Patient brought in by EMS for behavorial issues. They facility couldn't handle his acting out behavior. He is having hyper-religous thoughts and talking in third person. Patient is having auditory hallucinations. Patient is not violent.

## 2017-02-21 NOTE — Discharge Summary (Signed)
Physician Discharge Summary Note  Patient:  Jack [name] is an 31 y.o., male  MRN:  [id number]  DOB:  [birth year]  Patient phone:  [phone] (home)   Patient address:   [address] [place] [address],   Total Time spent with patient: Greater than 30 minutes  Date of Admission:  01/29/2017 Date of Discharge: 02-20-17  Reason for Admission: Worsening symptoms of Schizoaffective disorder with hyper-religiosity.  Principal Problem: Schizoaffective disorder, bipolar type [facility])  Discharge Diagnoses: Patient Active Problem List   Diagnosis Date Noted  . Bipolar affective disorder, manic, severe (HCC) [F31.13] 01/29/2017  . Agitation [R45.1] 03/09/2015  . Bipolar 1 disorder, manic, moderate (HCC) [F31.12] 03/08/2015  . Schizoaffective disorder, bipolar type (HCC) [F25.0] 01/10/2012  . Intellectual disability [F79] 01/10/2012   Past Psychiatric History: Bipolar disorder, manic episodes.  Past Medical History:  Past Medical History:  Diagnosis Date  . Bipolar 1 disorder (HCC)   . Intellectual disability   . Mental retardation   . Schizophrenia, schizo-affective (HCC)   . Seizures (HCC)    History reviewed. No pertinent surgical history.  Family History: History reviewed. No pertinent family history.  Family Psychiatric  History: See H&P.  Social History:  Social History   Substance and Sexual Activity  Alcohol Use No     Social History   Substance and Sexual Activity  Drug Use No    Social History   Socioeconomic History  . Marital status: Single    Spouse name: None  . Number of children: None  . Years of education: None  . Highest education level: None  Social Needs  . Financial resource strain: None  . Food insecurity - worry: None  . Food insecurity - inability: None  . Transportation needs - medical: None  . Transportation needs - non-medical: None  Occupational History  . None  Tobacco Use  . Smoking status: Never Smoker  .  Smokeless tobacco: Never Used  Substance and Sexual Activity  . Alcohol use: No  . Drug use: No  . Sexual activity: Not Currently  Other Topics Concern  . None  Social History Narrative  . None   Hospital Course: Jack [name] was admitted to the [facility] for worsening symptoms Schizoaffective, bipolar-type, manic episodes & crisis management due severe mania, restlessness & hyper-religiosity. Jack [name] was in serious need for mood stabilization treatments.   After his admission admission assessment, Jack [name]'s presenting symptoms were identified.The medication regimen  targeting those symptoms were initiated. Jack [name] was medicated & discharged on; Clozapine 100 mg bid for mood control, Clozapine 350 mg Q hs for mood control, Depakote DR 2000 mg for mood control/seizures, Gabapentin 300 mg for agitation, Hydroxyzine 50 mg prn for anxiety, Lithium Carbonate CR 900 mg for mood stabilization & Lorazepam 1 mg for anxiety. Jack [name] presented other significant pre-existing medical problems that required treatment or monitoring. Jack [name] received other medication regimen those health issues. Jack [name] tolerated his treatment regimen without any adverse effects reported. Jack [name] was enrolled & participated in the group counseling sessions being offered & held on this unit. Jack [name] learned coping skills.  As his treatment progressed, it took quite some time, however, improvement was monitored & noted by observation of his gradual reports of symptom reduction. His emotional & mental status were also monitored by daily self-inventory assessment reports completed by him & the clinical staff. Jack [name] was evaluated daily by the treatment team for mood stability & plans for continued recovery after discharge. Jack [name] was recommended for further  continued psychiatric treatment upon discharge by referring & scheduling him an outpatient psychiatric clinic for follow-up visits & medication managment as listed below.     Upon discharge, Jack [name] was both mentally and medically stable denying  SIHI,, auditory/visual/tactile hallucinations, delusional thoughts & or paranoia.  Jack [name] left [facility] in no apparent distress with all personal belongings. His medications were called in at his pharmacy. Transportation per mother.  Physical Findings: AIMS: Facial and Oral Movements Muscles of Facial Expression: None, normal Lips and Perioral Area: None, normal Jaw: None, normal Tongue: None, normal,Extremity Movements Upper (arms, wrists, hands, fingers): None, normal Lower (legs, knees, ankles, toes): None, normal, Trunk Movements Neck, shoulders, hips: None, normal, Overall Severity Severity of abnormal movements (highest score from questions above): None, normal Incapacitation due to abnormal movements: None, normal Patient's awareness of abnormal movements (rate only patient's report): No Awareness, Dental Status Current problems with teeth and/or dentures?: No Does patient usually wear dentures?: No  CIWA:    COWS:     Musculoskeletal: Strength & Muscle Tone: within normal limits Gait & Station: normal Patient leans: N/A  Psychiatric Specialty Exam: Physical Exam  Constitutional: Jack [name] appears well-developed.  HENT:  Head: Normocephalic.  Eyes: Pupils are equal, round, and reactive to light.  Neck: Normal range of motion.  Cardiovascular: Normal rate.  Respiratory: Effort normal.  GI: Soft.  Genitourinary:  Genitourinary Comments: Deferred  Musculoskeletal: Normal range of motion.  Neurological: Jack [name] is alert.  Skin: Skin is warm.    Review of Systems  Constitutional: Negative.   HENT: Negative.   Eyes: Negative.   Respiratory: Negative.   Cardiovascular: Negative.   Gastrointestinal: Negative.   Genitourinary: Negative.   Musculoskeletal: Negative.   Skin: Negative.   Neurological: Negative.   Endo/Heme/Allergies: Negative.   Psychiatric/Behavioral: Positive for depression (Stabilization with medication prior to discharge) and hallucinations (Hx. of hallucinations,  delusions & paranoia). Negative for memory loss, substance abuse and suicidal ideas. The patient has insomnia (Stabilized with medication prior to discharge). The patient is not nervous/anxious.     Blood pressure (!) 155/87, pulse 90, temperature 98.2 F (36.8 C), temperature source Oral, resp. rate 20, height 5\' 11"  (1.803 m), weight 81.6 kg (180 lb), SpO2 99 %.Body mass index is 25.1 kg/m.  See Md;s SRA.   Have you used any form of tobacco in the last 30 days? (Cigarettes, Smokeless Tobacco, Cigars, and/or Pipes): No  Has this patient used any form of tobacco in the last 30 days? (Cigarettes, Smokeless Tobacco, Cigars, and/or Pipes): N/A  Blood Alcohol level:  Lab Results  Component Value Date   ETH <10 02/21/2017   ETH <10 01/27/2017   Metabolic Disorder Labs:  Lab Results  Component Value Date   HGBA1C 5.0 01/31/2017   MPG 96.8 01/31/2017   Lab Results  Component Value Date   PROLACTIN 10.4 01/31/2017   Lab Results  Component Value Date   CHOL 128 01/31/2017   TRIG 105 01/31/2017   HDL 47 01/31/2017   CHOLHDL 2.7 01/31/2017   VLDL 21 01/31/2017   LDLCALC 60 01/31/2017   See Psychiatric Specialty Exam and Suicide Risk Assessment completed by Attending Physician prior to discharge.  Discharge destination:  Home  Is patient on multiple antipsychotic therapies at discharge:  No   Has Patient had three or more failed trials of antipsychotic monotherapy by history:  No  Recommended Plan for Multiple Antipsychotic Therapies: NA  Allergies as of 02/20/2017      Reactions   Iodinated Diagnostic Agents  Reaction unknown--per [facility].     Latex    Reaction unknown--per [facility].     Shellfish-derived Products    Reaction unknown--per [facility].        Medication List    STOP taking these medications   haloperidol 10 MG tablet Commonly known as:  HALDOL   magnesium oxide 400 MG tablet Commonly known as:  MAG-OX Replaced by:  magnesium  oxide 400 (241.3 Mg) MG tablet   trihexyphenidyl 2 MG tablet Commonly known as:  ARTANE     TAKE these medications     Indication  cloZAPine 100 MG tablet Commonly known as:  CLOZARIL Take 1 tablet (100 mg total) by mouth 2 (two) times daily. For mood control What changed:    when to take this  additional instructions  Another medication with the same name was removed. Continue taking this medication, and follow the directions you see here.  Indication:  Mood control   clozapine 50 MG tablet Commonly known as:  CLOZARIL Take 7 tablets (350 mg total) by mouth at bedtime. For mood control What changed:    medication strength  how much to take  additional instructions  Another medication with the same name was removed. Continue taking this medication, and follow the directions you see here.  Indication:  Mood control   divalproex 500 MG DR tablet Commonly known as:  DEPAKOTE Take 4 tablets (2,000 mg total) by mouth daily. For mood stabilization/seizures What changed:    how much to take  when to take this  additional instructions  Indication:  Mood stabilization/epilepsy   DOK 100 MG capsule Generic drug:  docusate sodium TK 1 Capsul PO BID AS NEEDED FOR CONSTIPATION What changed:  See the new instructions.  Indication:  Constipation   gabapentin 300 MG capsule Commonly known as:  NEURONTIN Take 1 capsule (300 mg total) by mouth 3 (three) times daily. For agitation  Indication:  Agitation   hydrOXYzine 50 MG tablet Commonly known as:  ATARAX/VISTARIL Take 1 tablet (50 mg total) by mouth every 6 (six) hours as needed for anxiety.  Indication:  Feeling Anxious   lithium carbonate 450 MG CR tablet Commonly known as:  ESKALITH Take 2 tablets (900 mg total) by mouth daily. For mood stabilization What changed:    medication strength  See the new instructions.  Another medication with the same name was removed. Continue taking this medication, and follow  the directions you see here.  Indication:  Mood stabilization   LORazepam 1 MG tablet Commonly known as:  ATIVAN Take 1 Tablet  PO TID for anxiety What changed:  See the new instructions.  Indication:  Anxiousness associated with Depression   magnesium oxide 400 (241.3 Mg) MG tablet Commonly known as:  MAG-OX Take 1 tablet (400 mg total) by mouth daily. For constipation Replaces:  magnesium oxide 400 MG tablet  Indication:  Constipation      Follow-up Information    [facility] Follow up on 05/11/2017.   Specialty:  Behavioral Health Why:  Thursday at 10;30 with Dr Vincent PeyerEksir Contact information: [address] [place] [id number] [phone]       [facility] Follow up on 03/08/2017.   Why:  Wednesday at 11:20 for your hospital follow up appointment Contact information: [address] [place] [address] [phone]          Follow-up recommendations: Activity:  As tolerated Diet: As recommended by  your primary care doctor. Keep all scheduled follow-up appointments as recommended.  Comments: Patient is instructed prior to discharge to: Take all medications as prescribed by his/her mental healthcare provider. Report any adverse effects and or reactions from the medicines to his/her outpatient provider promptly. Patient has been instructed & cautioned: To not engage in alcohol and or illegal drug use while on prescription medicines. In the event of worsening symptoms, patient is instructed to call the crisis hotline, 911 and or go to the nearest ED for appropriate evaluation and treatment of symptoms. To follow-up with his/her primary care provider for your other medical issues, concerns and or health care needs.   Signed: Armandina StammerAgnes Nwoko, NP, PMHNP, FNP-BC. 02/21/2017, 3:57 PM    Patient seen, Suicide Assessment Completed.  Disposition Plan Reviewed   Subjective Data: Jack [name] is a 31 y/o with history of  schizoaffective disorder bipolar type and intellectual disability who was admitted with worsening symptoms of psychosis, agitation, religious preoccupation/delusions, andmania. Pt has grandiose delusionsthat Jack [name] isa"reverend"chosenby God toinherit a church. Pt also has belief that Jack [name] is able to communicate withthe spirit of people,and Jack [name] appears to respond to internal stimuli by having conversations with himself.Jack [name] was restarted on previous medications of depakote, lithium, and clozapine. Hisdoseswereincreased, and Jack [name] has been monitored on the inpatient psychiatry unit.Jack [name] was also started on scheduled Ativan to address symptoms of agitation. Pt has been improving incrementally while on the inpatient unit.  Today upon evaluation, pt reports Jack [name] is doing well and Jack [name] states, Jack [name] has "No problems at all." Jack [name] denies SI/HI/AH/VH, but Jack [name] does appear to respond to the spirit of someone during the interview (which Jack [name] confirms is what Jack [name] is doing) after appearing to respond to internal stimuli. Pt reports Jack [name] is sleeping well and his appetite is good. Jack [name] is tolerating his current medication regimen without difficulty or side effects. Staff member, Julieanne CottonJosephine, from pt's group home came to visit and reported that pt is doing well and appears stable for discharge back to home. We discussed finding opportunities for Jack [name] to have some of the activities Jack [name] enjoys such as going to [employer] and [place] discussed about how they will follow up on that request. Pt also would like to switch his group home house, and there may be another home run by the same staff that is available for Jack [name] to switch to. Pt was able to engage in safety planning including [facility] if Jack [name] feels unable to maintain his own safety or the safety of others. Pt had no further questions, comments, or concerns.   Plan Of Care/Follow-up recommendations:   - Discharge to outpatient level of care (ACT team)  - Schizoaffective  disorder bipolar type:  - Continue clozapine 100 mg po BID 100mg  + 350mg  qhs. - Continuelithium 900mg  qDaily at 1700 (level 0.93 on 11/24) - Continuedepakote 2000mg  qDaily at 1700 (level 107 on 11/29)  -Continuegabapentin 300mg  TID for agitation - Continue ativan 1mg  TID at 0800, 1700, and 2200  Activity:  as tolerated Diet:  normal Tests:  depakote, lithium, and clozapine levels as outpatient Other:  NA  Micheal Likenshristopher T Andrew Soria, MD

## 2017-02-21 NOTE — Progress Notes (Addendum)
1:39pm: [facility] to inform her again that CSW has spoken with doctor and doctor has expressed that at this time pt has no further needs to be at the hospital. CSW informed Julieanne CottonJosephine that pt would be returning to the facility whether pt is picked up by the facility staff or by pt's mother being that facility has not given pt a 30 day discharge notice. CSW spoke with mother and mother expressed that she would reach back out to evening CSW for a further update on what needs to be done for pt. At this time CSW is awaiting call from [place]. CSW continuesto follow for needs.   [facility] and was informed that pt is not well per group home director. Group Home director expressed that pt can not come back to the group home because pt isn't well. CSW explained that if pt has not received a 30 day discharge then pt technically has to return to the facility when ready for discharge. CSW was asked to have pt's mother come pick pt up as mother is aware of pt's behavior. CSW reached out Gearldine BienenstockChristy Smith [phone] as group home staff informed CSW that this is pt's legal guardian. CSW has reached back out to [facility] to come and get pt today. CSW was asked to call back when Director returns.     Claude MangesKierra S. Bryahna Lesko, MSW, LCSW-A Emergency Department Clinical Social Worker [phone]

## 2017-02-21 NOTE — Progress Notes (Signed)
CSW left voice message for group home. CSW stated CSW will be calling pt's mother about returning pt to group home.  CSW spoke with medical director, Dr Lorain ChildesAaronson. Dr. Lorain ChildesAaronson stated that if group home continues refusing to allow pt to return, CSW will send police to the group home and start a state level investigation against the group home.   CSW will continue to try to reach a representative from the [facility], [phone]. Julieanne CottonJosephine is the supervisor and can be reached at [phone].   Montine CircleKelsy Tajae Rybicki, Silverio LayLCSWA [facility]  [phone]

## 2017-02-21 NOTE — Progress Notes (Addendum)
CS[facility] [place]. Julieanne CottonJosephine stated that pt can return to the group home via pt's mother. CSW called pt's mother. Pt mother explained that she will be taking the pt home until December 26th and then will return pt to the group home. Pt's mother explained that Julieanne CottonJosephine informed her that the group home will not have any staff until after Christmas.   Plan: Pt's mother will be pick up pt around 5 today. CSW notified pt's RN.   Montine CircleKelsy Raylie Maddison, Silverio LayLCSWA [facility]  [phone]

## 2017-02-21 NOTE — Progress Notes (Signed)
CSW spoke with representative from [facility] at [phone]. CSW explained that pt is cleared medically and psychiatrically cleared to return to the group home. Group home representative provided CSW with director's phone number, [phone]. CSW called and left voice message for supervisor.   Montine CircleKelsy Raymel Cull, Silverio LayLCSWA [facility]  [phone]

## 2017-02-21 NOTE — ED Provider Notes (Addendum)
[facility]-EMERGENCY DEPT Provider Note   CSN: [id number] Arrival date & time: 02/21/17  1016     History   Chief Complaint No chief complaint on file.   HPI Jack [name] is a 31 y.o. male.  Patient presents from his group home, by EMS, after police were called, because he was not being cooperative with staff members.  The patient is unable to give a here history.  He seems concerned about medications being inappropriate.  Yesterday, the patient was discharged after a prolonged stay at the behavioral health [facility].  The psychiatrist who saw him yesterday felt that he had showed incremental improvement with medication adjustment, and was therefore stable for discharge.  The patient told the psychiatrist that he wanted to change to a different group home, however the patient was sent back to the group home, where he was prior to the admission on 01/29/17.  Level 5 caveat- psychosis  HPI  Past Medical History:  Diagnosis Date  . Bipolar 1 disorder (HCC)   . Intellectual disability   . Mental retardation   . Schizophrenia, schizo-affective (HCC)   . Seizures [facility])     Patient Active Problem List   Diagnosis Date Noted  . Bipolar affective disorder, manic, severe (HCC) 01/29/2017  . Agitation 03/09/2015  . Bipolar 1 disorder, manic, moderate (HCC) 03/08/2015  . Schizoaffective disorder, bipolar type (HCC) 01/10/2012  . Intellectual disability 01/10/2012    History reviewed. No pertinent surgical history.     Home Medications    Prior to Admission medications   Medication Sig Start Date End Date Taking? Authorizing Provider  cloZAPine (CLOZARIL) 100 MG tablet Take 1 tablet (100 mg total) by mouth 2 (two) times daily. For mood control 02/20/17  Yes Nwoko, Nicole Kindred I, NP  cloZAPine (CLOZARIL) 50 MG tablet Take 7 tablets (350 mg total) by mouth at bedtime. For mood control 02/20/17  Yes Armandina Stammer I, NP  divalproex (DEPAKOTE) 500 MG DR tablet Take  4 tablets (2,000 mg total) by mouth daily. For mood stabilization/seizures 02/20/17  Yes Armandina Stammer I, NP  gabapentin (NEURONTIN) 300 MG capsule Take 1 capsule (300 mg total) by mouth 3 (three) times daily. For agitation 02/20/17  Yes Armandina Stammer I, NP  hydrOXYzine (ATARAX/VISTARIL) 50 MG tablet Take 1 tablet (50 mg total) by mouth every 6 (six) hours as needed for anxiety. 02/20/17  Yes Armandina Stammer I, NP  lithium carbonate (ESKALITH) 450 MG CR tablet Take 2 tablets (900 mg total) by mouth daily. For mood stabilization 02/20/17  Yes Armandina Stammer I, NP  LORazepam (ATIVAN) 1 MG tablet Take 1 Tablet  PO TID for anxiety 02/20/17  Yes Nwoko, Agnes I, NP  magnesium oxide (MAG-OX) 400 (241.3 Mg) MG tablet Take 1 tablet (400 mg total) by mouth daily. For constipation 02/21/17  Yes Nwoko, Nicole Kindred I, NP  DOK 100 MG capsule TK 1 Capsul PO BID AS NEEDED FOR CONSTIPATION 02/20/17   Sanjuana Kava, NP    Family History History reviewed. No pertinent family history.  Social History Social History   Tobacco Use  . Smoking status: Never Smoker  . Smokeless tobacco: Never Used  Substance Use Topics  . Alcohol use: No  . Drug use: No     Allergies   Iodinated diagnostic agents; Latex; and Shellfish-derived products   Review of Systems Review of Systems  All other systems reviewed and are negative.    Physical Exam Updated Vital Signs BP 125/64   Pulse  81   Temp 98.5 F (36.9 C) (Oral)   Resp 18   Ht 6\' 1"  (1.854 m)   Wt 77.1 kg (170 lb)   SpO2 99%   BMI 22.43 kg/m   Physical Exam  Constitutional: He appears well-developed and well-nourished. No distress.  HENT:  Head: Normocephalic and atraumatic.  Right Ear: External ear normal.  Left Ear: External ear normal.  Eyes: Conjunctivae and EOM are normal. Pupils are equal, round, and reactive to light.  Neck: Normal range of motion and phonation normal. Neck supple.  Cardiovascular: Normal rate.  Pulmonary/Chest: Effort normal. He  exhibits no bony tenderness.  Musculoskeletal: Normal range of motion.  Neurological: He is alert. No cranial nerve deficit or sensory deficit. He exhibits normal muscle tone. Coordination normal.  He is confused and unaware of recent history.  No dysarthria or aphasia.  Skin: Skin is warm, dry and intact.  Psychiatric:  Anxious, somewhat agitated.  Pressured speech is present.  Appears to be responding to internal stimuli, talking, when no one is in the room.  Nursing note and vitals reviewed.    ED Treatments / Results  Labs (all labs ordered are listed, but only abnormal results are displayed) Labs Reviewed  COMPREHENSIVE METABOLIC PANEL - Abnormal; Notable for the following components:      Result Value   AST 14 (*)    ALT 10 (*)    All other components within normal limits  ACETAMINOPHEN LEVEL - Abnormal; Notable for the following components:   Acetaminophen (Tylenol), Serum <10 (*)    All other components within normal limits  CBC - Abnormal; Notable for the following components:   RBC 4.00 (*)    Hemoglobin 11.9 (*)    HCT 36.1 (*)    All other components within normal limits  RAPID URINE DRUG SCREEN, HOSP PERFORMED - Abnormal; Notable for the following components:   Benzodiazepines POSITIVE (*)    All other components within normal limits  ETHANOL  SALICYLATE LEVEL  DIFFERENTIAL    EKG  EKG Interpretation None       Radiology No results found.  Procedures Procedures (including critical care time)  Medications Ordered in ED Medications - No data to display   Initial Impression / Assessment and Plan / ED Course  I have reviewed the triage vital signs and the nursing notes.  Pertinent labs & imaging results that were available during my care of the patient were reviewed by me and considered in my medical decision making (see chart for details).  Clinical Course as of Feb 21 1700  Tue Feb 21, 2017  1305 Presence of a prescribed medication is normal  Benzodiazepines: (!) POSITIVE [EW]  1306 Slightly low Hemoglobin: (!) 11.9 [EW]  1306 Normal Sodium: 142 [EW]  1306 Normal Creatinine: 0.81 [EW]  1306 Normal  [EW]  1306 Normal Salicylate Lvl: <7.0 [EW]  1306 At this time the patient is medically cleared.  [EW]  1659 Contact number for the patient's group home is [phone].  Employee there who knows the patient is Wilburt FinlayLauren Jones.  [EW]    Clinical Course User Index [EW] Mancel BaleWentz, Jetty Berland, MD     Patient Vitals for the past 24 hrs:  BP Temp Temp src Pulse Resp SpO2 Height Weight  02/21/17 1024 - - - - - - 6\' 1"  (1.854 m) 77.1 kg (170 lb)  02/21/17 1022 125/64 98.5 F (36.9 C) Oral 81 18 99 % - -    Social work consult-they communicated with group  home providers, who were concerned that the patient "is not well."    Final Clinical Impressions(s) / ED Diagnoses   Final diagnoses:  Schizoaffective disorder, bipolar type (HCC)  Intellectual disability    Patient with chronic psychiatric illness, discharged from the psychiatric hospital yesterday after a prolonged treatment, with improvement on current medications.  Patient appears to be at his baseline, the same as at discharge yesterday, he is therefore stable to return to his group home.  Nursing Notes Reviewed/ Care Coordinated Applicable Imaging Reviewed Interpretation of Laboratory Data incorporated into ED treatment  The patient appears reasonably screened and/or stabilized for discharge and I doubt any other medical condition or other [facility] requiring further screening, evaluation, or treatment in the ED at this time prior to discharge.  Plan: Home Medications-continue current medications; Home Treatments-rest; return here if the recommended treatment, does not improve the symptoms; Recommended follow up-PCP and psychiatry follow-up as needed    ED Discharge Orders    None       Mancel BaleWentz, Jivan Symanski, MD 02/21/17 1441    Mancel BaleWentz, Slade [place]point, MD 02/21/17 [phone]

## 2017-05-11 ENCOUNTER — Encounter (HOSPITAL_COMMUNITY): Payer: Self-pay | Admitting: Psychiatry

## 2017-05-11 ENCOUNTER — Ambulatory Visit (INDEPENDENT_AMBULATORY_CARE_PROVIDER_SITE_OTHER): Payer: Medicare Other | Admitting: Psychiatry

## 2017-05-11 DIAGNOSIS — F79 Unspecified intellectual disabilities: Secondary | ICD-10-CM | POA: Diagnosis not present

## 2017-05-11 DIAGNOSIS — F3113 Bipolar disorder, current episode manic without psychotic features, severe: Secondary | ICD-10-CM

## 2017-05-11 DIAGNOSIS — F250 Schizoaffective disorder, bipolar type: Secondary | ICD-10-CM

## 2017-05-11 NOTE — Progress Notes (Signed)
[facility] MD/PA/NP OP Progress Note  05/11/2017 10:29 AM Jack [name]  MRN:  [id number]  Chief Complaint: Psych ED follow-up  HPI: Jack [name] presents for psychiatric follow-up after several emergency department visits recently.   He is known to this Probation officer from prior ED care. Scheduled to follow-up with this writer to make sure that the patient has the appropriate level of care arranged after hospitalization.  Follow-up consultation at the request of mom.  He was inpatient in December and his dose of clozapine was increased.   He is followed by [facility] behavioral for laboratory studies and for psychiatric medication management.  His group home caretaker is present today with him and brought him to the visit.    Reports that Jack [name] has been stable, while he does continue with baseline hyper religious beliefs, he is able to sleep in the evening, easily redirected, and the clozapine dose has been tolerated without any issue. There have not been any safety issues or violence in the group home.  Patient has been followed at [facility] for many years, and we will continue to follow there for his psychiatric care.  He continues to live at the group home and participate in [facility] programming daily.  He also will be beginning peers support in the coming months.  I suggested that they consider dose increase in the fall given that he tends to have worse mood instability in the fall and winter months.  I wrote down some recommendations for the group home to consider and discuss with [facility].  Visit Diagnosis:    ICD-10-CM   1. Bipolar affective disorder, manic, severe ([place]) F31.13   2. Intellectual disability F79   3. Schizoaffective disorder, bipolar type ([place]) F25.0     Past Medical History:  Past Medical History:  Diagnosis Date  . Bipolar 1 disorder ([place])   . Intellectual disability   . Mental retardation   . Schizophrenia, schizo-affective ([place])   . Seizures ([place])    No past surgical history on  file.  Family Psychiatric History: n/a  Family History: No family history on file.  Social History:  Social History   Socioeconomic History  . Marital status: Single    Spouse name: Not on file  . Number of children: Not on file  . Years of education: Not on file  . Highest education level: Not on file  Social Needs  . Financial resource strain: Not on file  . Food insecurity - worry: Not on file  . Food insecurity - inability: Not on file  . Transportation needs - medical: Not on file  . Transportation needs - non-medical: Not on file  Occupational History  . Not on file  Tobacco Use  . Smoking status: Never Smoker  . Smokeless tobacco: Never Used  Substance and Sexual Activity  . Alcohol use: No  . Drug use: No  . Sexual activity: Not Currently  Other Topics Concern  . Not on file  Social History Narrative  . Not on file    Allergies:  Allergies  Allergen Reactions  . Iodinated Diagnostic Agents     Reaction unknown--per [facility].    . Latex     Reaction unknown--per [facility].    [employer]     Reaction unknown--per [facility].      Metabolic Disorder Labs: Lab Results  Component Value Date   HGBA1C 5.0 01/31/2017   MPG 96.8 01/31/2017   Lab Results  Component Value Date  PROLACTIN 10.4 01/31/2017   Lab Results  Component Value Date   CHOL 128 01/31/2017   TRIG 105 01/31/2017   HDL 47 01/31/2017   CHOLHDL 2.7 01/31/2017   VLDL 21 01/31/2017   LDLCALC 60 01/31/2017   Lab Results  Component Value Date   TSH 1.318 01/31/2017   TSH 2.15 03/20/2013    Therapeutic Level Labs: Lab Results  Component Value Date   LITHIUM 0.93 01/28/2017   LITHIUM 0.37 (L) 03/07/2015   Lab Results  Component Value Date   VALPROATE 107 (H) 02/02/2017   VALPROATE 45 (L) 01/30/2017   No components found for:  CBMZ  Current Medications: Current Outpatient Medications  Medication Sig Dispense Refill  . cloZAPine  (CLOZARIL) 100 MG tablet Take 1 tablet (100 mg total) by mouth 2 (two) times daily. For mood control 60 tablet 0  . cloZAPine (CLOZARIL) 50 MG tablet Take 7 tablets (350 mg total) by mouth at bedtime. For mood control 210 tablet 0  . divalproex (DEPAKOTE) 500 MG DR tablet Take 4 tablets (2,000 mg total) by mouth daily. For mood stabilization/seizures 120 tablet 0  . DOK 100 MG capsule TK 1 Capsul PO BID AS NEEDED FOR CONSTIPATION 30 capsule 0  . gabapentin (NEURONTIN) 300 MG capsule Take 1 capsule (300 mg total) by mouth 3 (three) times daily. For agitation 90 capsule 0  . hydrOXYzine (ATARAX/VISTARIL) 50 MG tablet Take 1 tablet (50 mg total) by mouth every 6 (six) hours as needed for anxiety. 60 tablet 0  . lithium carbonate (ESKALITH) 450 MG CR tablet Take 2 tablets (900 mg total) by mouth daily. For mood stabilization 60 tablet 0  . LORazepam (ATIVAN) 1 MG tablet Take 1 Tablet  PO TID for anxiety 10 tablet 0  . magnesium oxide (MAG-OX) 400 (241.3 Mg) MG tablet Take 1 tablet (400 mg total) by mouth daily. For constipation 30 tablet 0   No current facility-administered medications for this visit.      Musculoskeletal: Strength & Muscle Tone: within normal limits Gait & Station: normal Patient leans: N/A  Psychiatric Specialty Exam: ROS  There were no vitals taken for this visit.There is no height or weight on file to calculate BMI.  General Appearance: Casual and Fairly Groomed  Eye Contact:  Fair  Speech:  Garbled, Normal Rate and Slow  Volume:  Normal  Mood:  Euthymic  Affect:  Congruent and Restricted  Thought Process:  Coherent, Irrelevant and Descriptions of Associations: Circumstantial  Orientation:  Full (Time, Place, and Person)  Thought Content: religious preoccupations   Suicidal Thoughts:  No  Homicidal Thoughts:  No  Memory:  Immediate;   Fair  Judgement:  Impaired  Insight:  Lacking  Psychomotor Activity:  Normal  Concentration:  Concentration: Poor  Recall:  Poor   Fund of Knowledge: Poor  Language: Poor  Akathisia:  Negative  Handed:  Right  AIMS (if indicated): not done  Assets:  Financial Resources/Insurance Housing Social Support  ADL's:  Impaired  Cognition: Impaired,  Moderate  Sleep:  Fair   Screenings: AIMS     Admission (Discharged) from 01/29/2017 in [facility]  AIMS Total Score  0       Assessment and Plan:  Jack [name] is a 33 year old male with intellectual disability and schizoaffective disorder, who lives in [facility] group home.  His group home caretaker brought him today for psychiatric follow-up visit after multiple ED admissions.  The patient continues in psychiatric care with  his primary providers at [facility] behavioral, and they continue medication management and laboratory studies for clozapine management.  Patient does appear to be more stable after dose increase in clozapine which was initiated in the emergency department, and he has been able to behave safely with himself and others at his group home.  He has multiple resources including PSR, group home support, and will have peer supports starting later this year.  No acute safety issues at this time, no further follow-up in this clinic indicated, he is welcome to return if they have any concerns that are not being met by the care at [facility].  1. Bipolar affective disorder, manic, severe ([place])   2. Intellectual disability   3. Schizoaffective disorder, bipolar type ([place])     Status of current problems: stable  Labs Ordered: No orders of the defined types were placed in this encounter.   Labs Reviewed: n/a  Collateral Obtained/Records Reviewed: n/a  Plan:  Continue medication management and laboratory studies with [facility] behavioral As needed follow-up in office If the patient continues to have multiple emergency department visits, I would suggest consideration of act team  I spent 15 minutes with the patient in direct  face-to-face clinical care.  Greater than 50% of this time was spent in counseling and coordination of care with the patient.    Aundra Dubin, MD 05/11/2017, 10:29 AM

## 2017-08-24 DIAGNOSIS — F840 Autistic disorder: Secondary | ICD-10-CM | POA: Diagnosis present

## 2018-03-25 ENCOUNTER — Emergency Department (HOSPITAL_BASED_OUTPATIENT_CLINIC_OR_DEPARTMENT_OTHER)
Admission: EM | Admit: 2018-03-25 | Discharge: 2018-03-25 | Disposition: A | Discharge status: Home / Self Care | Payer: Medicare Other | Attending: Emergency Medicine | Admitting: Emergency Medicine

## 2018-03-25 ENCOUNTER — Encounter (HOSPITAL_BASED_OUTPATIENT_CLINIC_OR_DEPARTMENT_OTHER): Payer: Self-pay | Admitting: *Deleted

## 2018-03-25 ENCOUNTER — Other Ambulatory Visit: Payer: Self-pay

## 2018-03-25 DIAGNOSIS — L089 Local infection of the skin and subcutaneous tissue, unspecified: Secondary | ICD-10-CM | POA: Diagnosis not present

## 2018-03-25 DIAGNOSIS — L539 Erythematous condition, unspecified: Secondary | ICD-10-CM | POA: Diagnosis not present

## 2018-03-25 DIAGNOSIS — Z79899 Other long term (current) drug therapy: Secondary | ICD-10-CM | POA: Insufficient documentation

## 2018-03-25 DIAGNOSIS — Z91040 Latex allergy status: Secondary | ICD-10-CM | POA: Insufficient documentation

## 2018-03-25 DIAGNOSIS — L0291 Cutaneous abscess, unspecified: Secondary | ICD-10-CM | POA: Diagnosis present

## 2018-03-25 DIAGNOSIS — L723 Sebaceous cyst: Secondary | ICD-10-CM | POA: Diagnosis not present

## 2018-03-25 MED ORDER — LIDOCAINE-EPINEPHRINE (PF) 2 %-1:200000 IJ SOLN
10.0000 mL | Freq: Once | INTRAMUSCULAR | Status: AC
  Administered 2018-03-25: 10 mL
  Filled 2018-03-25 (×2): qty 10

## 2018-03-25 NOTE — ED Provider Notes (Signed)
[facility] EMERGENCY DEPARTMENT Provider Note   CSN: [id number] Arrival date & time: 03/25/18  1820     History   Chief Complaint Chief Complaint  Patient presents with  . Abscess    HPI Jack [name] is a 33 y.o. male.  Patient with history of bipolar disorder, schizophrenia, presents the emergency department with swelling over the right clavicle area with overlying erythema.  Area is tender to palpation.  Mother is with patient at bedside.  She is not sure how long the area has been there.  He does have a history of boils in the axillary areas.  No nausea, vomiting, diarrhea.  No fevers.  No treatments prior to arrival.  Mother states that PCP office is closed tomorrow and she did not want to wait until Tuesday to be seen.  Onset of symptoms gradual.  Course is constant.     Past Medical History:  Diagnosis Date  . Bipolar 1 disorder (HCC)   . Intellectual disability   . Mental retardation   . Schizophrenia, schizo-affective (HCC)   . Seizures [facility](HCC)     Patient Active Problem List   Diagnosis Date Noted  . Bipolar affective disorder, manic, severe (HCC) 01/29/2017  . Agitation 03/09/2015  . Bipolar 1 disorder, manic, moderate (HCC) 03/08/2015  . Schizoaffective disorder, bipolar type (HCC) 01/10/2012  . Intellectual disability 01/10/2012    History reviewed. No pertinent surgical history.      Home Medications    Prior to Admission medications   Medication Sig Start Date End Date Taking? Authorizing Provider  cloZAPine (CLOZARIL) 100 MG tablet Take 1 tablet (100 mg total) by mouth 2 (two) times daily. For mood control 02/20/17   Armandina StammerNwoko, Agnes I, NP  cloZAPine (CLOZARIL) 50 MG tablet Take 7 tablets (350 mg total) by mouth at bedtime. For mood control 02/20/17   Armandina StammerNwoko, Agnes I, NP  divalproex (DEPAKOTE) 500 MG DR tablet Take 4 tablets (2,000 mg total) by mouth daily. For mood stabilization/seizures 02/20/17   Armandina StammerNwoko, Agnes I, NP  DOK 100 MG capsule TK 1  Capsul PO BID AS NEEDED FOR CONSTIPATION 02/20/17   Armandina StammerNwoko, Agnes I, NP  gabapentin (NEURONTIN) 300 MG capsule Take 1 capsule (300 mg total) by mouth 3 (three) times daily. For agitation 02/20/17   Armandina StammerNwoko, Agnes I, NP  hydrOXYzine (ATARAX/VISTARIL) 50 MG tablet Take 1 tablet (50 mg total) by mouth every 6 (six) hours as needed for anxiety. 02/20/17   Armandina StammerNwoko, Agnes I, NP  lithium carbonate (ESKALITH) 450 MG CR tablet Take 2 tablets (900 mg total) by mouth daily. For mood stabilization 02/20/17   Armandina StammerNwoko, Agnes I, NP  LORazepam (ATIVAN) 1 MG tablet Take 1 Tablet  PO TID for anxiety 02/20/17   Armandina StammerNwoko, Agnes I, NP  magnesium oxide (MAG-OX) 400 (241.3 Mg) MG tablet Take 1 tablet (400 mg total) by mouth daily. For constipation 02/21/17   Sanjuana KavaNwoko, Agnes I, NP    Family History No family history on file.  Social History Social History   Tobacco Use  . Smoking status: Never Smoker  . Smokeless tobacco: Never Used  Substance Use Topics  . Alcohol use: No  . Drug use: No     Allergies   Iodinated diagnostic agents; Latex; and Shellfish-derived products   Review of Systems Review of Systems  Constitutional: Negative for fever.  Gastrointestinal: Negative for nausea and vomiting.  Skin: Positive for color change.       Positive for abscess  Hematological: Negative  for adenopathy.     Physical Exam Updated Vital Signs BP 127/90 (BP Location: Left Arm)   Pulse (!) 108   Temp 98.3 F (36.8 C) (Oral)   Resp 18   Ht 5\' 11"  (1.803 m)   Wt 81.6 kg   SpO2 100%   BMI 25.10 kg/m   Physical Exam Vitals signs and nursing note reviewed.  Constitutional:      Appearance: He is well-developed.  HENT:     Head: Normocephalic and atraumatic.  Eyes:     Conjunctiva/sclera: Conjunctivae normal.  Neck:     Musculoskeletal: Normal range of motion and neck supple.  Pulmonary:     Effort: No respiratory distress.  Skin:    General: Skin is warm and dry.     Comments: 3 cm area of swelling,  nonpulsatile, overlying the right clavicular area with moderate fluctuance.  Borders are soft.  Mild overlying erythema.  No extension of cellulitis.  Tenderness to palpation.  Neurological:     Mental Status: He is alert.      ED Treatments / Results  Labs (all labs ordered are listed, but only abnormal results are displayed) Labs Reviewed - No data to display  EKG None  Radiology No results found.  Procedures .Marland Kitchen.Incision and Drainage Date/Time: 03/25/2018 11:24 PM Performed by: Renne CriglerGeiple, Cherity Blickenstaff, PA-C Authorized by: Renne CriglerGeiple, Cinque Begley, PA-C   Consent:    Consent obtained:  Verbal   Consent given by:  Patient and parent   Risks discussed:  Pain, bleeding, incomplete drainage and infection   Alternatives discussed:  No treatment Location:    Type:  Cyst   Size:  3cm   Location:  Trunk   Trunk location:  Chest Pre-procedure details:    Skin preparation:  Betadine Anesthesia (see MAR for exact dosages):    Anesthesia method:  Local infiltration   Local anesthetic:  Lidocaine 2% WITH epi Procedure details:    Incision types:  Stab incision   Scalpel blade:  11   Wound management:  Probed and deloculated   Drainage:  Purulent   Drainage amount:  Copious   Wound treatment:  Wound left open   Packing materials:  None Post-procedure details:    Patient tolerance of procedure:  Tolerated well, no immediate complications   (including critical care time)  Medications Ordered in ED Medications  lidocaine-EPINEPHrine (XYLOCAINE W/EPI) 2 %-1:200000 (PF) injection 10 mL (has no administration in time range)     Initial Impression / Assessment and Plan / ED Course  I have reviewed the triage vital signs and the nursing notes.  Pertinent labs & imaging results that were available during my care of the patient were reviewed by me and considered in my medical decision making (see chart for details).     Patient seen and examined.  Unclear if this is a boil.  Will attempt  incision and drainage to decompress.  Mother aware that if we do not obtain much drainage, he may need to follow-up with PCP for consideration of biopsy.  Vital signs reviewed and are as follows: BP 127/90 (BP Location: Left Arm)   Pulse (!) 108   Temp 98.3 F (36.8 C) (Oral)   Resp 18   Ht 5\' 11"  (1.803 m)   Wt 81.6 kg   SpO2 100%   BMI 25.10 kg/m   Patient did well.  Purulent drainage obtained.  Material consistent with an infected sebaceous cyst.  The patient was urged to return to the Emergency Department urgently  with worsening pain, swelling, expanding erythema especially if it streaks away from the affected area, fever, or if they have any other concerns.   The patient was urged to return to the Emergency Department or go to their PCP in 48 hours for wound recheck if the area is not significantly improved.  The patient verbalized understanding and stated agreement with this plan.      Final Clinical Impressions(s) / ED Diagnoses   Final diagnoses:  Infected sebaceous cyst   Patient with infected sebaceous cyst overlying the right clavicular area.  Successful I&D performed.  No systemic symptoms of illness.  Well drained, do not feel antibiotics are indicated at this time.  ED Discharge Orders    None       Renne Crigler, Cordelia Poche 03/25/18 2326    Rolan Bucco, MD 03/25/18 [phone]

## 2018-03-25 NOTE — Discharge Instructions (Signed)
Please read and follow all provided instructions.  Your diagnoses today include:  1. Infected sebaceous cyst     Tests performed today include:  Vital signs. See below for your results today.   Medications prescribed:   None  Take any prescribed medications only as directed.   Home care instructions:   Follow any educational materials contained in this packet  Follow-up instructions: Return to the Emergency Department in 48 hours for a recheck if your symptoms are not significantly improved.  Return instructions:  Return to the Emergency Department if you have:  Fever  Worsening symptoms  Worsening pain  Worsening swelling  Redness of the skin that moves away from the affected area, especially if it streaks away from the affected area   Any other emergent concerns  Your vital signs today were: BP 127/90 (BP Location: Left Arm)    Pulse (!) 108    Temp 98.3 F (36.8 C) (Oral)    Resp 18    Ht 5\' 11"  (1.803 m)    Wt 81.6 kg    SpO2 100%    BMI 25.10 kg/m  If your blood pressure (BP) was elevated above 135/85 this visit, please have this repeated by your doctor within one month. --------------

## 2019-05-21 ENCOUNTER — Encounter (HOSPITAL_COMMUNITY): Payer: Self-pay | Admitting: Emergency Medicine

## 2019-05-21 ENCOUNTER — Other Ambulatory Visit: Payer: Self-pay

## 2019-05-21 ENCOUNTER — Emergency Department (HOSPITAL_COMMUNITY)
Admission: EM | Admit: 2019-05-21 | Discharge: 2019-05-21 | Disposition: A | Discharge status: Home / Self Care | Payer: Medicare Other | Attending: Emergency Medicine | Admitting: Emergency Medicine

## 2019-05-21 DIAGNOSIS — Z79899 Other long term (current) drug therapy: Secondary | ICD-10-CM | POA: Diagnosis not present

## 2019-05-21 DIAGNOSIS — L02214 Cutaneous abscess of groin: Secondary | ICD-10-CM | POA: Diagnosis not present

## 2019-05-21 DIAGNOSIS — L0291 Cutaneous abscess, unspecified: Secondary | ICD-10-CM | POA: Diagnosis present

## 2019-05-21 DIAGNOSIS — Z91040 Latex allergy status: Secondary | ICD-10-CM | POA: Insufficient documentation

## 2019-05-21 MED ORDER — SULFAMETHOXAZOLE-TRIMETHOPRIM 800-160 MG PO TABS
1.0000 | ORAL_TABLET | Freq: Once | ORAL | Status: AC
  Administered 2019-05-21: 1 via ORAL
  Filled 2019-05-21: qty 1

## 2019-05-21 MED ORDER — SULFAMETHOXAZOLE-TRIMETHOPRIM 800-160 MG PO TABS: 1.0000 | ORAL_TABLET | Freq: Two times a day (BID) | ORAL | 0 refills | Status: AC

## 2019-05-21 MED ORDER — LIDOCAINE-EPINEPHRINE 2 %-1:100000 IJ SOLN
20.0000 mL | Freq: Once | INTRAMUSCULAR | Status: AC
  Administered 2019-05-21: 20 mL
  Filled 2019-05-21: qty 1

## 2019-05-21 NOTE — ED Provider Notes (Signed)
[name]-EMERGENCY DEPT Provider Note   CSN: [id number] Arrival date & time: 05/21/19  1041     History Chief Complaint  Patient presents with  . Recurrent Skin Infections    Jack [name] is a 34 y.o. male.  Patient is a 34 year old with bipolar disorder and intellectual disability coming from group home with group home caregiver for abscess to the groin area.  Patient is a poor historian.  Caregiver states that patient began to complain of pain in the groin area for about 2 weeks.  Patient reports that he has 2 areas in the groin.  Reports one is on the scrotum and has been there for several months and is not bothersome.  Reports that there is a new spot in the inguinal area on the right side which is painful and swollen        Past Medical History:  Diagnosis Date  . Bipolar 1 disorder (HCC)   . Intellectual disability   . Mental retardation   . Schizophrenia, schizo-affective (HCC)   . Seizures [facility])     Patient Active Problem List   Diagnosis Date Noted  . Bipolar affective disorder, manic, severe (HCC) 01/29/2017  . Agitation 03/09/2015  . Bipolar 1 disorder, manic, moderate (HCC) 03/08/2015  . Schizoaffective disorder, bipolar type (HCC) 01/10/2012  . Intellectual disability 01/10/2012    History reviewed. No pertinent surgical history.     No family history on file.  Social History   Tobacco Use  . Smoking status: Never Smoker  . Smokeless tobacco: Never Used  Substance Use Topics  . Alcohol use: No  . Drug use: No    Home Medications Prior to Admission medications   Medication Sig Start Date End Date Taking? Authorizing Provider  cloZAPine (CLOZARIL) 100 MG tablet Take 1 tablet (100 mg total) by mouth 2 (two) times daily. For mood control Patient taking differently: Take 100-350 mg by mouth in the morning, at noon, and at bedtime. Take 1 tablet (100 mg) BID & Take 3.5 tablets (350 mg) in the evening. For mood control  02/20/17  Yes Armandina Stammer I, NP  divalproex (DEPAKOTE) 500 MG DR tablet Take 4 tablets (2,000 mg total) by mouth daily. For mood stabilization/seizures 02/20/17  Yes Armandina Stammer I, NP  gabapentin (NEURONTIN) 300 MG capsule Take 1 capsule (300 mg total) by mouth 3 (three) times daily. For agitation 02/20/17  Yes Armandina Stammer I, NP  hydrOXYzine (ATARAX/VISTARIL) 50 MG tablet Take 1 tablet (50 mg total) by mouth every 6 (six) hours as needed for anxiety. Patient taking differently: Take 50 mg by mouth in the morning, at noon, in the evening, and at bedtime.  02/20/17  Yes Armandina Stammer I, NP  hydrOXYzine (VISTARIL) 50 MG capsule Take 50 mg by mouth in the morning, at noon, in the evening, and at bedtime.  05/14/19  Yes [provider]  lithium carbonate (ESKALITH) 450 MG CR tablet Take 2 tablets (900 mg total) by mouth daily. For mood stabilization 02/20/17  Yes Nwoko, Nicole Kindred I, NP  magnesium oxide (MAG-OX) 400 (241.3 Mg) MG tablet Take 1 tablet (400 mg total) by mouth daily. For constipation 02/21/17  Yes Armandina Stammer I, NP  oxybutynin (DITROPAN) 5 MG tablet Take 10 mg by mouth at bedtime.  05/17/19  Yes [provider]  cloZAPine (CLOZARIL) 50 MG tablet Take 7 tablets (350 mg total) by mouth at bedtime. For mood control Patient not taking: Reported on 05/21/2019 02/20/17   Nwoko,  Nelda Marseille, NP  DOK 100 MG capsule TK 1 Capsul PO BID AS NEEDED FOR CONSTIPATION Patient not taking: Reported on 05/21/2019 02/20/17   Armandina Stammer I, NP  LORazepam (ATIVAN) 1 MG tablet Take 1 Tablet  PO TID for anxiety Patient not taking: Reported on 05/21/2019 02/20/17   Armandina Stammer I, NP  sulfamethoxazole-trimethoprim (BACTRIM DS) 800-160 MG tablet Take 1 tablet by mouth 2 (two) times daily for 7 days. 05/21/19 05/28/19  Arlyn Dunning, PA-C    Allergies    Iodinated diagnostic agents, Latex, and Shellfish-derived products  Review of Systems   Review of Systems  Constitutional: Negative for appetite change.   Gastrointestinal: Negative for abdominal pain.  Genitourinary: Negative for penile pain, penile swelling, scrotal swelling and testicular pain.  Skin: Positive for color change and rash.  Allergic/Immunologic: Negative for immunocompromised state.  Hematological: Does not bruise/bleed easily.    Physical Exam Updated Vital Signs BP (!) 130/92   Pulse 99   Temp 98.5 F (36.9 C) (Oral)   Resp 18   SpO2 98%   Physical Exam Vitals and nursing note reviewed. Exam conducted with a chaperone present.  Constitutional:      General: He is not in acute distress.    Appearance: Normal appearance. He is not ill-appearing, toxic-appearing or diaphoretic.  HENT:     Head: Normocephalic.  Eyes:     Conjunctiva/sclera: Conjunctivae normal.  Pulmonary:     Effort: Pulmonary effort is normal.  Genitourinary:    Penis: Normal.      Testes: Normal.    Skin:    General: Skin is dry.  Neurological:     Mental Status: He is alert.  Psychiatric:        Mood and Affect: Mood normal.     ED Results / Procedures / Treatments   Labs (all labs ordered are listed, but only abnormal results are displayed) Labs Reviewed - No data to display  EKG None  Radiology No results found.  Procedures .Marland KitchenIncision and Drainage  Date/Time: 05/21/2019 1:01 PM Performed by: Arlyn Dunning, PA-C Authorized by: Arlyn Dunning, PA-C   Consent:    Consent obtained:  Verbal   Consent given by:  Patient and guardian   Risks discussed:  Bleeding, incomplete drainage, pain and damage to other organs   Alternatives discussed:  No treatment Universal protocol:    Procedure explained and questions answered to patient or proxy's satisfaction: yes     Relevant documents present and verified: yes     Test results available and properly labeled: yes     Imaging studies available: yes     Required blood products, implants, devices, and special equipment available: yes     Site/side marked: yes      Immediately prior to procedure a time out was called: yes     Patient identity confirmed:  Verbally with patient Location:    Type:  Abscess   Size:  3cm   Location:  Trunk (right groin) Pre-procedure details:    Skin preparation:  Chloraprep Anesthesia (see MAR for exact dosages):    Anesthesia method:  Local infiltration   Local anesthetic:  Lidocaine 1% WITH epi Procedure type:    Complexity:  Simple Procedure details:    Incision types:  Single straight   Incision depth:  Subcutaneous   Scalpel blade:  11   Wound management:  Irrigated with saline and extensive cleaning   Drainage:  Purulent   Drainage amount:  Moderate Post-procedure  details:    Patient tolerance of procedure:  Tolerated well, no immediate complications   (including critical care time)  Medications Ordered in ED Medications  lidocaine-EPINEPHrine (XYLOCAINE W/EPI) 2 %-1:100000 (with pres) injection 20 mL (20 mLs Infiltration Given 05/21/19 1225)  sulfamethoxazole-trimethoprim (BACTRIM DS) 800-160 MG per tablet 1 tablet (1 tablet Oral Given 05/21/19 1224)    ED Course  I have reviewed the triage vital signs and the nursing notes.  Pertinent labs & imaging results that were available during my care of the patient were reviewed by me and considered in my medical decision making (see chart for details).    MDM Rules/Calculators/A&P                      Based on review of vitals, medical screening exam, lab work and/or imaging, there does not appear to be an acute, emergent etiology for the patient's symptoms. Counseled pt on good return precautions and encouraged both PCP and ED follow-up as needed.  Prior to discharge, I also discussed incidental imaging findings with patient in detail and advised appropriate, recommended follow-up in detail.  Clinical Impression: 1. Abscess of groin, right     Disposition: Discharge  Prior to providing a prescription for a controlled substance, I independently  reviewed the patient's recent prescription history on the [facility]. The patient had no recent or regular prescriptions and was deemed appropriate for a brief, less than 3 day prescription of narcotic for acute analgesia.  This note was prepared with assistance of Systems analyst. Occasional wrong-word or sound-a-like substitutions may have occurred due to the inherent limitations of voice recognition software.  Final Clinical Impression(s) / ED Diagnoses Final diagnoses:  Abscess of groin, right    Rx / DC Orders ED Discharge Orders         Ordered    sulfamethoxazole-trimethoprim (BACTRIM DS) 800-160 MG tablet  2 times daily     05/21/19 1301           Kristine Royal 05/21/19 1303    Davonna Belling, MD 05/21/19 1500

## 2019-05-21 NOTE — ED Triage Notes (Signed)
Pt brought in by caregiver for boil under testicle area.

## 2019-05-21 NOTE — Discharge Instructions (Signed)
Thank you for allowing me to care for you today. Please return to the emergency department if you have new or worsening symptoms. Take your medications as instructed.  ° °

## 2019-08-29 ENCOUNTER — Other Ambulatory Visit: Payer: Self-pay

## 2019-08-29 ENCOUNTER — Encounter (HOSPITAL_COMMUNITY): Payer: Self-pay

## 2019-08-29 ENCOUNTER — Emergency Department (HOSPITAL_COMMUNITY)
Admission: EM | Admit: 2019-08-29 | Discharge: 2019-08-29 | Disposition: A | Discharge status: Home / Self Care | Payer: Medicare Other | Attending: Emergency Medicine | Admitting: Emergency Medicine

## 2019-08-29 DIAGNOSIS — R000 Tachycardia, unspecified: Secondary | ICD-10-CM | POA: Insufficient documentation

## 2019-08-29 DIAGNOSIS — Z5321 Procedure and treatment not carried out due to patient leaving prior to being seen by health care provider: Secondary | ICD-10-CM | POA: Diagnosis not present

## 2019-08-29 DIAGNOSIS — L02214 Cutaneous abscess of groin: Secondary | ICD-10-CM | POA: Insufficient documentation

## 2019-08-29 LAB — CBC WITH DIFFERENTIAL/PLATELET
Abs Immature Granulocytes: 0.03 10*3/uL (ref 0.00–0.07)
Basophils Absolute: 0 10*3/uL (ref 0.0–0.1)
Basophils Relative: 1 %
Eosinophils Absolute: 0.1 10*3/uL (ref 0.0–0.5)
Eosinophils Relative: 1 %
HCT: 39.8 % (ref 39.0–52.0)
Hemoglobin: 12.5 g/dL — ABNORMAL LOW (ref 13.0–17.0)
Immature Granulocytes: 1 %
Lymphocytes Relative: 24 %
Lymphs Abs: 1.6 10*3/uL (ref 0.7–4.0)
MCH: 28.7 pg (ref 26.0–34.0)
MCHC: 31.4 g/dL (ref 30.0–36.0)
MCV: 91.3 fL (ref 80.0–100.0)
Monocytes Absolute: 0.4 10*3/uL (ref 0.1–1.0)
Monocytes Relative: 5 %
Neutro Abs: 4.6 10*3/uL (ref 1.7–7.7)
Neutrophils Relative %: 68 %
Platelets: 249 10*3/uL (ref 150–400)
RBC: 4.36 MIL/uL (ref 4.22–5.81)
RDW: 13.7 % (ref 11.5–15.5)
WBC: 6.7 10*3/uL (ref 4.0–10.5)
nRBC: 0 % (ref 0.0–0.2)

## 2019-08-29 LAB — COMPREHENSIVE METABOLIC PANEL
ALT: 10 U/L (ref 0–44)
AST: 12 U/L — ABNORMAL LOW (ref 15–41)
Albumin: 3.9 g/dL (ref 3.5–5.0)
Alkaline Phosphatase: 68 U/L (ref 38–126)
Anion gap: 7 (ref 5–15)
BUN: 6 mg/dL (ref 6–20)
CO2: 27 mmol/L (ref 22–32)
Calcium: 9.2 mg/dL (ref 8.9–10.3)
Chloride: 108 mmol/L (ref 98–111)
Creatinine, Ser: 1 mg/dL (ref 0.61–1.24)
GFR calc Af Amer: >60 mL/min (ref >60)
GFR calc non Af Amer: >60 mL/min (ref >60)
Glucose, Bld: 78 mg/dL (ref 70–99)
Potassium: 4.1 mmol/L (ref 3.5–5.1)
Sodium: 142 mmol/L (ref 135–145)
Total Bilirubin: 0.6 mg/dL (ref 0.3–1.2)
Total Protein: 6.8 g/dL (ref 6.5–8.1)

## 2019-08-29 MED ORDER — SODIUM CHLORIDE 0.9% FLUSH: 3.0000 mL | Freq: Once | INTRAVENOUS | Status: DC

## 2019-08-29 NOTE — ED Notes (Signed)
Pt decided to leave the emergency department 

## 2019-08-29 NOTE — ED Triage Notes (Signed)
Patient arrived by [facility] from his adult daycare. Patient found to be tachy and has had abscess in groin in past and appears to have returned. patient denies pain, alert to baseline.

## 2020-08-14 ENCOUNTER — Emergency Department (HOSPITAL_COMMUNITY)
Admission: EM | Admit: 2020-08-14 | Discharge: 2020-08-15 | Disposition: A | Discharge status: Home / Self Care | Payer: Medicare Other | Attending: Emergency Medicine | Admitting: Emergency Medicine

## 2020-08-14 ENCOUNTER — Encounter (HOSPITAL_COMMUNITY): Payer: Self-pay

## 2020-08-14 DIAGNOSIS — R451 Restlessness and agitation: Secondary | ICD-10-CM | POA: Diagnosis not present

## 2020-08-14 DIAGNOSIS — Y900 Blood alcohol level of less than 20 mg/100 ml: Secondary | ICD-10-CM | POA: Diagnosis not present

## 2020-08-14 DIAGNOSIS — Z91040 Latex allergy status: Secondary | ICD-10-CM | POA: Insufficient documentation

## 2020-08-14 DIAGNOSIS — F250 Schizoaffective disorder, bipolar type: Secondary | ICD-10-CM | POA: Diagnosis present

## 2020-08-14 DIAGNOSIS — R569 Unspecified convulsions: Secondary | ICD-10-CM | POA: Insufficient documentation

## 2020-08-14 DIAGNOSIS — F79 Unspecified intellectual disabilities: Secondary | ICD-10-CM | POA: Insufficient documentation

## 2020-08-14 DIAGNOSIS — F209 Schizophrenia, unspecified: Secondary | ICD-10-CM

## 2020-08-14 LAB — CBC WITH DIFFERENTIAL/PLATELET
Abs Immature Granulocytes: 0.02 10*3/uL (ref 0.00–0.07)
Basophils Absolute: 0 10*3/uL (ref 0.0–0.1)
Basophils Relative: 1 %
Eosinophils Absolute: 0.2 10*3/uL (ref 0.0–0.5)
Eosinophils Relative: 2 %
HCT: 43.1 % (ref 39.0–52.0)
Hemoglobin: 14.1 g/dL (ref 13.0–17.0)
Immature Granulocytes: 0 %
Lymphocytes Relative: 32 %
Lymphs Abs: 2.7 10*3/uL (ref 0.7–4.0)
MCH: 27.8 pg (ref 26.0–34.0)
MCHC: 32.7 g/dL (ref 30.0–36.0)
MCV: 84.8 fL (ref 80.0–100.0)
Monocytes Absolute: 0.5 10*3/uL (ref 0.1–1.0)
Monocytes Relative: 6 %
Neutro Abs: 5.1 10*3/uL (ref 1.7–7.7)
Neutrophils Relative %: 59 %
Platelets: 229 10*3/uL (ref 150–400)
RBC: 5.08 MIL/uL (ref 4.22–5.81)
RDW: 13.7 % (ref 11.5–15.5)
WBC: 8.5 10*3/uL (ref 4.0–10.5)
nRBC: 0 % (ref 0.0–0.2)

## 2020-08-14 LAB — ETHANOL: Alcohol, Ethyl (B): <10 mg/dL (ref <10)

## 2020-08-14 LAB — COMPREHENSIVE METABOLIC PANEL
ALT: 16 U/L (ref 0–44)
AST: 22 U/L (ref 15–41)
Albumin: 3.8 g/dL (ref 3.5–5.0)
Alkaline Phosphatase: 76 U/L (ref 38–126)
Anion gap: 10 (ref 5–15)
BUN: 12 mg/dL (ref 6–20)
CO2: 24 mmol/L (ref 22–32)
Calcium: 9.2 mg/dL (ref 8.9–10.3)
Chloride: 105 mmol/L (ref 98–111)
Creatinine, Ser: 1.2 mg/dL (ref 0.61–1.24)
GFR, Estimated: >60 mL/min (ref >60)
Glucose, Bld: 99 mg/dL (ref 70–99)
Potassium: 4.1 mmol/L (ref 3.5–5.1)
Sodium: 139 mmol/L (ref 135–145)
Total Bilirubin: 0.7 mg/dL (ref 0.3–1.2)
Total Protein: 6.7 g/dL (ref 6.5–8.1)

## 2020-08-14 LAB — LITHIUM LEVEL: Lithium Lvl: 0.23 mmol/L — ABNORMAL LOW (ref 0.60–1.20)

## 2020-08-14 LAB — VALPROIC ACID LEVEL: Valproic Acid Lvl: 70 ug/mL (ref 50.0–100.0)

## 2020-08-14 MED ORDER — DIVALPROEX SODIUM 250 MG PO DR TAB
2000.0000 mg | DELAYED_RELEASE_TABLET | Freq: Every day | ORAL | Status: DC
  Administered 2020-08-15: 2000 mg via ORAL
  Filled 2020-08-14: qty 8

## 2020-08-14 MED ORDER — LITHIUM CARBONATE ER 450 MG PO TBCR
900.0000 mg | EXTENDED_RELEASE_TABLET | Freq: Every day | ORAL | Status: DC
  Administered 2020-08-15: 900 mg via ORAL
  Filled 2020-08-14: qty 2

## 2020-08-14 MED ORDER — MAGNESIUM OXIDE -MG SUPPLEMENT 400 (240 MG) MG PO TABS
400.0000 mg | ORAL_TABLET | Freq: Every day | ORAL | Status: DC
  Administered 2020-08-15: 400 mg via ORAL
  Filled 2020-08-14 (×2): qty 1

## 2020-08-14 MED ORDER — HYDROXYZINE HCL 25 MG PO TABS
50.0000 mg | ORAL_TABLET | Freq: Four times a day (QID) | ORAL | Status: DC
  Administered 2020-08-14 – 2020-08-15 (×3): 50 mg via ORAL
  Filled 2020-08-14 (×3): qty 2

## 2020-08-14 MED ORDER — DIVALPROEX SODIUM 250 MG PO DR TAB: 2000.0000 mg | DELAYED_RELEASE_TABLET | Freq: Every day | ORAL | Status: DC

## 2020-08-14 NOTE — ED Provider Notes (Signed)
Lithium level is subtherapeutic and valproic acid is within normal limits.  CBC and CMP are within normal limits.  Patient is medically clear.   Gwyneth Sprout, MD 08/14/20 [phone]

## 2020-08-14 NOTE — ED Provider Notes (Addendum)
[facility] EMERGENCY DEPARTMENT Provider Note   CSN: [id number] Arrival date & time: 08/14/20  1249     History Chief Complaint  Patient presents with   Psychiatric Evaluation    Jack [name] is a 35 y.o. male. Level 5 caveat due to psychiatric disorder. HPI Reportedly sent in from daycare for psychiatric evaluation.  Has schizophrenia and bipolar disorder history.  Patient really cannot provide any history.  Reportedly had had a recent decrease in medications.    Past Medical History:  Diagnosis Date   Bipolar 1 disorder (HCC)    Intellectual disability    Mental retardation    Schizophrenia, schizo-affective (HCC)    Seizures (HCC)     Patient Active Problem List   Diagnosis Date Noted   Bipolar affective disorder, manic, severe (HCC) 01/29/2017   Agitation 03/09/2015   Bipolar 1 disorder, manic, moderate (HCC) 03/08/2015   Schizoaffective disorder, bipolar type (HCC) 01/10/2012   Intellectual disability 01/10/2012    History reviewed. No pertinent surgical history.     History reviewed. No pertinent family history.  Social History   Tobacco Use   Smoking status: Never   Smokeless tobacco: Never  Vaping Use   Vaping Use: Never used  Substance Use Topics   Alcohol use: No   Drug use: No    Home Medications Prior to Admission medications   Medication Sig Start Date End Date Taking? Authorizing Provider  cloZAPine (CLOZARIL) 100 MG tablet Take 1 tablet (100 mg total) by mouth 2 (two) times daily. For mood control Patient taking differently: Take 100-350 mg by mouth in the morning, at noon, and at bedtime. Take 1 tablet (100 mg) BID & Take 3.5 tablets (350 mg) in the evening. For mood control 02/20/17   Armandina Stammer I, NP  cloZAPine (CLOZARIL) 50 MG tablet Take 7 tablets (350 mg total) by mouth at bedtime. For mood control Patient not taking: Reported on 05/21/2019 02/20/17   Armandina Stammer I, NP  divalproex (DEPAKOTE) 500 MG DR tablet  Take 4 tablets (2,000 mg total) by mouth daily. For mood stabilization/seizures 02/20/17   Armandina Stammer I, NP  DOK 100 MG capsule TK 1 Capsul PO BID AS NEEDED FOR CONSTIPATION Patient not taking: Reported on 05/21/2019 02/20/17   Armandina Stammer I, NP  gabapentin (NEURONTIN) 300 MG capsule Take 1 capsule (300 mg total) by mouth 3 (three) times daily. For agitation 02/20/17   Armandina Stammer I, NP  hydrOXYzine (ATARAX/VISTARIL) 50 MG tablet Take 1 tablet (50 mg total) by mouth every 6 (six) hours as needed for anxiety. Patient taking differently: Take 50 mg by mouth in the morning, at noon, in the evening, and at bedtime.  02/20/17   Armandina Stammer I, NP  hydrOXYzine (VISTARIL) 50 MG capsule Take 50 mg by mouth in the morning, at noon, in the evening, and at bedtime.  05/14/19   [provider]  lithium carbonate (ESKALITH) 450 MG CR tablet Take 2 tablets (900 mg total) by mouth daily. For mood stabilization 02/20/17   Armandina Stammer I, NP  LORazepam (ATIVAN) 1 MG tablet Take 1 Tablet  PO TID for anxiety Patient not taking: Reported on 05/21/2019 02/20/17   Armandina Stammer I, NP  magnesium oxide (MAG-OX) 400 (241.3 Mg) MG tablet Take 1 tablet (400 mg total) by mouth daily. For constipation 02/21/17   Armandina Stammer I, NP  oxybutynin (DITROPAN) 5 MG tablet Take 10 mg by mouth at bedtime.  05/17/19   [provider]    Allergies    Iodinated diagnostic agents, Latex, and Shellfish-derived products  Review of Systems   Review of Systems  Unable to perform ROS: Psychiatric disorder   Physical Exam Updated Vital Signs BP 103/72 (BP Location: Right Arm)   Pulse 95   Temp 98.5 F (36.9 C) (Oral)   Resp 13   SpO2 98%   Physical Exam Vitals and nursing note reviewed.  HENT:     Head: Normocephalic.     Nose: Nose normal.  Eyes:     General: No scleral icterus. Cardiovascular:     Rate and Rhythm: Regular rhythm.  Pulmonary:     Breath sounds: No wheezing or rhonchi.  Abdominal:      Tenderness: There is no abdominal tenderness.  Musculoskeletal:        General: No tenderness.  Skin:    General: Skin is warm.     Capillary Refill: Capillary refill takes less than 2 seconds.  Neurological:     Mental Status: He is alert.     Comments: In bed.  Will answer some questions after putting his cross to his chest.  Moving all extremities.  Psychiatric:     Comments: Pressured speech.  Quickly changes topics.  Difficulty get history from.    ED Results / Procedures / Treatments   Labs (all labs ordered are listed, but only abnormal results are displayed) Labs Reviewed  VALPROIC ACID LEVEL  LITHIUM LEVEL  CBC WITH DIFFERENTIAL/PLATELET  COMPREHENSIVE METABOLIC PANEL  RAPID URINE DRUG SCREEN, HOSP PERFORMED  ETHANOL    EKG None  Radiology No results found.  Procedures Procedures   Medications Ordered in ED Medications - No data to display  ED Course  I have reviewed the triage vital signs and the nursing notes.  Pertinent labs & imaging results that were available during my care of the patient were reviewed by me and considered in my medical decision making (see chart for details).    MDM Rules/Calculators/A&P                          Patient reportedly brought in for psychiatric evaluation.  History of schizophrenia.  Patient really cannot provide any history at this time.  I am unsure what is baseline is however..  Somewhat focused on religious aspects and is holding up his hands as if he is watching movie or TV show.  Have not been able to reach patient's mother.  No get lab work.  Reportedly had recent change in medications.  We will also have TTS evaluation.  Care turned over to Dr. Anitra Lauth. Final Clinical Impression(s) / ED Diagnoses Final diagnoses:  Schizophrenia, unspecified type [facility])    Rx / DC Orders ED Discharge Orders     None        Benjiman Core, MD 08/14/20 1514    Benjiman Core, MD 08/14/20 1557

## 2020-08-14 NOTE — ED Triage Notes (Signed)
Lives at [facility] and was at daycare today. Pt meds were recently reduced. Sent here for psych eval. No agitation observed. Pt has a schedule with his pcp on Monday for meds eval. Hx of schizoaffective DO and bipolar type.

## 2020-08-14 NOTE — ED Notes (Signed)
Patient refusing to speak to writer at this time. Will continue to monitor. Patient does continue to speak to the air and is clearly having auditory hallucinations at this time.

## 2020-08-14 NOTE — ED Notes (Signed)
Lunch tray ordered 

## 2020-08-14 NOTE — BH Assessment (Addendum)
Clinician attempted to see patient.  Patient held up his hands and said to go away.  Pt said 'blah-blah" and waved clinician away.  Will attempt again via teleassessment.

## 2020-08-14 NOTE — ED Notes (Signed)
Pt brought in by EMS for psych eval. Pt was at [facility] day care today ran by [facility] where pt lives at a group home. Reports meds were decreased recently from 6 meds to 2 meds. Pt has been labile with word salad. Talking about his religious beliefs but jumps from one topic to another. Hard to redirect at this time. Safety precautions maintained. Changed into scrubs and 1 belongings bag locked in locker. Pt was wanded by security. Waiting for his mother who is on the way to [facility] for collateral information. Hx of schizophrenia, bipolar DO and intellectual disability. Will continue to monitor.

## 2020-08-14 NOTE — ED Notes (Signed)
Lunch tray ordered at this time.

## 2020-08-15 DIAGNOSIS — F250 Schizoaffective disorder, bipolar type: Secondary | ICD-10-CM | POA: Diagnosis not present

## 2020-08-15 MED ORDER — ZIPRASIDONE MESYLATE 20 MG IM SOLR
20.0000 mg | Freq: Once | INTRAMUSCULAR | Status: AC
  Administered 2020-08-15: 20 mg via INTRAMUSCULAR
  Filled 2020-08-15: qty 20

## 2020-08-15 MED ORDER — DIPHENHYDRAMINE HCL 50 MG/ML IJ SOLN
50.0000 mg | Freq: Once | INTRAMUSCULAR | Status: AC
  Administered 2020-08-15: 50 mg via INTRAMUSCULAR
  Filled 2020-08-15: qty 1

## 2020-08-15 NOTE — ED Provider Notes (Signed)
Patient has been evaluated by psychiatry and it is felt that he can be safely discharged.  Upon reevaluation patient is smiling, clapping and singing.   Gilda Crease, MD 08/15/20 1102

## 2020-08-15 NOTE — ED Notes (Signed)
Patient belongings given to mother upon discharge.

## 2020-08-15 NOTE — ED Notes (Signed)
Patient refusing vitals at this time. Preoccupied with hand clapping and singing.

## 2020-08-15 NOTE — ED Notes (Signed)
This tech and the nurse attempted to get pt's vitals before discharge, he became very irate and started cursing. He refused to let either of [place] get vitals.

## 2020-08-15 NOTE — BH Assessment (Signed)
Per Ophelia Shoulder, PMHNP patient does not meet in patient care criteria. She will send a few days worth of current medications. April, RN and patient's mother/ legal guardian, [name], notified.  Get prescription filled at [facility] on [address]. Mother will be there to pick up patient aorund 12:00
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
## 2021-03-08 NOTE — H&P (Signed)
HISTORY AND PHYSICAL  ZECHARIAH BISSONNETTE is a 36 y.o. male patient with CC:  No diagnosis found.  Past Medical History:  Diagnosis Date   Bipolar 1 disorder (HCC)    Intellectual disability    Mental retardation    Schizophrenia, schizo-affective (HCC)    Seizures (HCC)     No current facility-administered medications for this encounter.   Current Outpatient Medications  Medication Sig Dispense Refill   cloZAPine (CLOZARIL) 100 MG tablet Take 1 tablet (100 mg total) by mouth 2 (two) times daily. For mood control (Patient taking differently: Take 100-350 mg by mouth See admin instructions. Take 100 mg in the morning and afternoon and 350 mg at bedtime) 60 tablet 0   divalproex (DEPAKOTE) 500 MG DR tablet Take 4 tablets (2,000 mg total) by mouth daily. For mood stabilization/seizures 120 tablet 0   gabapentin (NEURONTIN) 300 MG capsule Take 1 capsule (300 mg total) by mouth 3 (three) times daily. For agitation 90 capsule 0   hydrOXYzine (ATARAX/VISTARIL) 50 MG tablet Take 1 tablet (50 mg total) by mouth every 6 (six) hours as needed for anxiety. (Patient taking differently: Take 50 mg by mouth in the morning, at noon, in the evening, and at bedtime.) 60 tablet 0   lithium carbonate (ESKALITH) 450 MG CR tablet Take 2 tablets (900 mg total) by mouth daily. For mood stabilization 60 tablet 0   magnesium oxide (MAG-OX) 400 (241.3 Mg) MG tablet Take 1 tablet (400 mg total) by mouth daily. For constipation 30 tablet 0   cloZAPine (CLOZARIL) 50 MG tablet Take 7 tablets (350 mg total) by mouth at bedtime. For mood control (Patient not taking: Reported on 05/21/2019) 210 tablet 0   DOK 100 MG capsule TK 1 Capsul PO BID AS NEEDED FOR CONSTIPATION (Patient not taking: Reported on 05/21/2019) 30 capsule 0   LORazepam (ATIVAN) 1 MG tablet Take 1 Tablet  PO TID for anxiety (Patient not taking: Reported on 05/21/2019) 10 tablet 0   Allergies  Allergen Reactions   Iodinated Contrast Media Other (See Comments)     Reaction unknown--per [facility].     Latex Rash   Shellfish-Derived Products Rash   Active Problems:   * No active hospital problems. *  Vitals: There were no vitals taken for this visit. Lab results:No results found for this or any previous visit (from the past 24 hour(s)). Radiology Results: No results found. General appearance: alert, cooperative, and no distress Head: Normocephalic, without obvious abnormality, atraumatic Eyes: negative Nose: Nares normal. Septum midline. Mucosa normal. No drainage or sinus tenderness. Throat: Multiple dental caries. No purulence, fluctuance, edema, trismus. Pharynx clear.  Neck: no adenopathy Resp: clear to auscultation bilaterally Cardio: regular rate and rhythm, S1, S2 normal, no murmur, click, rub or gallop  Assessment:Multiple non-restorable teeth secondary to dental caries # 2, 3, 4, 5, 12, 13, 14, 15, 18, 20, 21, 28, 29, 30, impacted teeth #1, 16.   Plan: Multiple dental extractions with alveoloplasty. GA, Day surgery   Ocie Doyne 03/08/2021

## 2021-03-10 ENCOUNTER — Encounter (HOSPITAL_COMMUNITY): Payer: Self-pay | Admitting: Oral Surgery

## 2021-03-10 ENCOUNTER — Other Ambulatory Visit: Payer: Self-pay

## 2021-03-10 NOTE — Progress Notes (Signed)
PCP - [facility]  Cardiologist - Denies  EP- Denies  Endocrine- Denies  Pulm- Denies  Chest x-ray - Denies  EKG - Denies  Stress Test - Denies  ECHO - Denies  Cardiac Cath - Denies  AICD-na PM-na LOOP-na  Dialysis- Denies  Sleep Study - Denies CPAP - Denies  LABS- 03/12/21: CBC, BMP  ASA- Denies  ERAS- No  HA1C- Denies  Anesthesia- No  Interview done with the mom Lorene Dy, and she denies the pt having chest pain, sob, or fever during the pre-op phone call. She also states that the pt lives at a group home called [facility], but he will recover at her home after the procedure. All instructions explained to [place], with a verbal understanding of the material including: as of today, stop taking all Aspirin (unless instructed by your doctor) and Other Aspirin containing products, Vitamins, Fish oils, and Herbal medications. Also stop all NSAIDS i.e. Advil, Ibuprofen, Motrin, Aleve, Anaprox, Naproxen, BC, Goody Powders, and all Supplements. Lorene Dy also instructed for them to wear a mask and social distance if they go out. The opportunity to ask questions was provided.    Coronavirus Screening  Have you experienced the following symptoms:  Cough yes/no: No Fever (>100.23F)  yes/no: No Runny nose yes/no: No Sore throat yes/no: No Difficulty breathing/shortness of breath  yes/no: No  Have you or a family member traveled in the last 14 days and where? yes/no: No   If the patient indicates "YES" to the above questions, their PAT will be rescheduled to limit the exposure to others and, the surgeon will be notified. THE PATIENT WILL NEED TO BE ASYMPTOMATIC FOR 14 DAYS.   If the patient is not experiencing any of these symptoms, the PAT nurse will instruct them to NOT bring anyone with them to their appointment since they may have these symptoms or traveled as well.   Please remind your patients and families that hospital visitation restrictions are in effect  and the importance of the restrictions.

## 2021-03-11 NOTE — Anesthesia Preprocedure Evaluation (Addendum)
Anesthesia Evaluation  Patient identified by MRN, date of birth, ID band Patient awake    Reviewed: Allergy & Precautions, H&P , NPO status , Patient's Chart, lab work & pertinent test results  Airway Mallampati: II  TM Distance: >3 FB Neck ROM: Full    Dental no notable dental hx. (+) Poor Dentition   Pulmonary neg pulmonary ROS,    Pulmonary exam normal breath sounds clear to auscultation       Cardiovascular Exercise Tolerance: Good negative cardio ROS Normal cardiovascular exam Rhythm:Regular Rate:Normal     Neuro/Psych Seizures -,  PSYCHIATRIC DISORDERS Bipolar Disorder Schizophrenia MR negative neurological ROS  negative psych ROS   GI/Hepatic negative GI ROS, Neg liver ROS,   Endo/Other  negative endocrine ROS  Renal/GU negative Renal ROS  negative genitourinary   Musculoskeletal negative musculoskeletal ROS (+)   Abdominal   Peds negative pediatric ROS (+)  Hematology negative hematology ROS (+)   Anesthesia Other Findings Bipolar 1 disorder, manic, moderate  Agitation Bipolar affective disorder, manic, severe  Schizoaffective disorder, bipolar type  Intellectual disability    Reproductive/Obstetrics negative OB ROS                            Anesthesia Physical Anesthesia Plan  ASA: 3  Anesthesia Plan: General   Post-op Pain Management: Ofirmev IV (intra-op)   Induction: Intravenous  PONV Risk Score and Plan: 2 and Ondansetron, Dexamethasone and Treatment may vary due to age or medical condition  Airway Management Planned: Oral ETT  Additional Equipment: None  Intra-op Plan:   Post-operative Plan: Extubation in OR  Informed Consent: I have reviewed the patients History and Physical, chart, labs and discussed the procedure including the risks, benefits and alternatives for the proposed anesthesia with the patient or authorized representative who has indicated  his/her understanding and acceptance.       Plan Discussed with: Anesthesiologist and CRNA  Anesthesia Plan Comments: (  )       Anesthesia Quick Evaluation

## 2021-03-12 ENCOUNTER — Ambulatory Visit (HOSPITAL_COMMUNITY): Payer: Medicare HMO | Admitting: Certified Registered Nurse Anesthetist

## 2021-03-12 ENCOUNTER — Encounter (HOSPITAL_COMMUNITY): Payer: Self-pay | Admitting: Oral Surgery

## 2021-03-12 ENCOUNTER — Ambulatory Visit (HOSPITAL_COMMUNITY)
Admission: RE | Admit: 2021-03-12 | Discharge: 2021-03-12 | Disposition: A | Discharge status: Home / Self Care | Payer: Medicare HMO | Attending: Oral Surgery | Admitting: Oral Surgery

## 2021-03-12 ENCOUNTER — Encounter (HOSPITAL_COMMUNITY)
Admission: RE | Disposition: A | Discharge status: Home / Self Care | Payer: Self-pay | Source: Home / Self Care | Attending: Oral Surgery

## 2021-03-12 DIAGNOSIS — F209 Schizophrenia, unspecified: Secondary | ICD-10-CM | POA: Insufficient documentation

## 2021-03-12 DIAGNOSIS — F319 Bipolar disorder, unspecified: Secondary | ICD-10-CM | POA: Diagnosis not present

## 2021-03-12 DIAGNOSIS — K029 Dental caries, unspecified: Secondary | ICD-10-CM | POA: Diagnosis not present

## 2021-03-12 DIAGNOSIS — K011 Impacted teeth: Secondary | ICD-10-CM | POA: Insufficient documentation

## 2021-03-12 HISTORY — PX: TOOTH EXTRACTION: SHX859

## 2021-03-12 LAB — BASIC METABOLIC PANEL
Anion gap: 9 (ref 5–15)
BUN: 6 mg/dL (ref 6–20)
CO2: 24 mmol/L (ref 22–32)
Calcium: 8.9 mg/dL (ref 8.9–10.3)
Chloride: 105 mmol/L (ref 98–111)
Creatinine, Ser: 1.07 mg/dL (ref 0.61–1.24)
GFR, Estimated: >60 mL/min (ref >60)
Glucose, Bld: 84 mg/dL (ref 70–99)
Potassium: 4.9 mmol/L (ref 3.5–5.1)
Sodium: 138 mmol/L (ref 135–145)

## 2021-03-12 LAB — CBC
HCT: 39.8 % (ref 39.0–52.0)
Hemoglobin: 12.8 g/dL — ABNORMAL LOW (ref 13.0–17.0)
MCH: 29 pg (ref 26.0–34.0)
MCHC: 32.2 g/dL (ref 30.0–36.0)
MCV: 90 fL (ref 80.0–100.0)
Platelets: 160 10*3/uL (ref 150–400)
RBC: 4.42 MIL/uL (ref 4.22–5.81)
RDW: 14.9 % (ref 11.5–15.5)
WBC: 6.4 10*3/uL (ref 4.0–10.5)
nRBC: 0 % (ref 0.0–0.2)

## 2021-03-12 SURGERY — DENTAL RESTORATION/EXTRACTIONS
Anesthesia: General

## 2021-03-12 MED ORDER — LIDOCAINE 2% (20 MG/ML) 5 ML SYRINGE
INTRAMUSCULAR | Status: DC | PRN
Start: 2021-03-12 — End: 2021-03-12
  Administered 2021-03-12: 100 mg via INTRAVENOUS

## 2021-03-12 MED ORDER — LIDOCAINE 2% (20 MG/ML) 5 ML SYRINGE
INTRAMUSCULAR | Status: AC
  Filled 2021-03-12: qty 5

## 2021-03-12 MED ORDER — OXYCODONE-ACETAMINOPHEN 5-325 MG PO TABS: 1.0000 | ORAL_TABLET | ORAL | 0 refills | Status: DC | PRN

## 2021-03-12 MED ORDER — ACETAMINOPHEN 10 MG/ML IV SOLN
INTRAVENOUS | Status: DC | PRN
  Administered 2021-03-12: 1000 mg via INTRAVENOUS

## 2021-03-12 MED ORDER — ACETAMINOPHEN 10 MG/ML IV SOLN
INTRAVENOUS | Status: AC
  Filled 2021-03-12: qty 100

## 2021-03-12 MED ORDER — LIDOCAINE-EPINEPHRINE 2 %-1:100000 IJ SOLN
INTRAMUSCULAR | Status: AC
  Filled 2021-03-12: qty 1

## 2021-03-12 MED ORDER — MEPERIDINE HCL 25 MG/ML IJ SOLN: 6.2500 mg | INTRAMUSCULAR | Status: DC | PRN

## 2021-03-12 MED ORDER — NALOXONE HCL 0.4 MG/ML IJ SOLN
INTRAMUSCULAR | Status: DC | PRN
  Administered 2021-03-12 (×2): 40 ug via INTRAVENOUS

## 2021-03-12 MED ORDER — DEXAMETHASONE SODIUM PHOSPHATE 10 MG/ML IJ SOLN
INTRAMUSCULAR | Status: AC
  Filled 2021-03-12: qty 1

## 2021-03-12 MED ORDER — LACTATED RINGERS IV SOLN: INTRAVENOUS | Status: DC

## 2021-03-12 MED ORDER — ACETAMINOPHEN 325 MG PO TABS: 325.0000 mg | ORAL_TABLET | ORAL | Status: DC | PRN

## 2021-03-12 MED ORDER — ONDANSETRON HCL 4 MG/2ML IJ SOLN
INTRAMUSCULAR | Status: DC | PRN
  Administered 2021-03-12: 4 mg via INTRAVENOUS

## 2021-03-12 MED ORDER — ROCURONIUM BROMIDE 10 MG/ML (PF) SYRINGE
PREFILLED_SYRINGE | INTRAVENOUS | Status: AC
  Filled 2021-03-12: qty 10

## 2021-03-12 MED ORDER — PHENYLEPHRINE 40 MCG/ML (10ML) SYRINGE FOR IV PUSH (FOR BLOOD PRESSURE SUPPORT)
PREFILLED_SYRINGE | INTRAVENOUS | Status: DC | PRN
  Administered 2021-03-12: 40 ug via INTRAVENOUS
  Administered 2021-03-12 (×2): 80 ug via INTRAVENOUS

## 2021-03-12 MED ORDER — ROCURONIUM BROMIDE 10 MG/ML (PF) SYRINGE
PREFILLED_SYRINGE | INTRAVENOUS | Status: DC | PRN
  Administered 2021-03-12: 60 mg via INTRAVENOUS

## 2021-03-12 MED ORDER — SUGAMMADEX SODIUM 200 MG/2ML IV SOLN
INTRAVENOUS | Status: DC | PRN
  Administered 2021-03-12: 200 mg via INTRAVENOUS

## 2021-03-12 MED ORDER — MIDAZOLAM HCL 2 MG/2ML IJ SOLN
INTRAMUSCULAR | Status: AC
  Filled 2021-03-12: qty 2

## 2021-03-12 MED ORDER — FENTANYL CITRATE (PF) 250 MCG/5ML IJ SOLN
INTRAMUSCULAR | Status: DC | PRN
  Administered 2021-03-12: 100 ug via INTRAVENOUS

## 2021-03-12 MED ORDER — CHLORHEXIDINE GLUCONATE 0.12 % MT SOLN
OROMUCOSAL | Status: AC
  Administered 2021-03-12: 15 mL via OROMUCOSAL
  Filled 2021-03-12: qty 15

## 2021-03-12 MED ORDER — ONDANSETRON HCL 4 MG/2ML IJ SOLN: 4.0000 mg | Freq: Once | INTRAMUSCULAR | Status: DC | PRN

## 2021-03-12 MED ORDER — KETAMINE HCL 50 MG/5ML IJ SOSY
PREFILLED_SYRINGE | INTRAMUSCULAR | Status: AC
  Filled 2021-03-12: qty 5

## 2021-03-12 MED ORDER — OXYMETAZOLINE HCL 0.05 % NA SOLN
NASAL | Status: DC | PRN
  Administered 2021-03-12: 2 via NASAL

## 2021-03-12 MED ORDER — CHLORHEXIDINE GLUCONATE 0.12 % MT SOLN: 15.0000 mL | Freq: Once | OROMUCOSAL | Status: AC

## 2021-03-12 MED ORDER — DEXMEDETOMIDINE (PRECEDEX) IN NS 20 MCG/5ML (4 MCG/ML) IV SYRINGE
PREFILLED_SYRINGE | INTRAVENOUS | Status: DC | PRN
  Administered 2021-03-12: 12 ug via INTRAVENOUS

## 2021-03-12 MED ORDER — DEXMEDETOMIDINE (PRECEDEX) IN NS 20 MCG/5ML (4 MCG/ML) IV SYRINGE
PREFILLED_SYRINGE | INTRAVENOUS | Status: AC
  Filled 2021-03-12: qty 5

## 2021-03-12 MED ORDER — AMOXICILLIN 500 MG PO CAPS: 500.0000 mg | ORAL_CAPSULE | Freq: Three times a day (TID) | ORAL | 0 refills | Status: DC

## 2021-03-12 MED ORDER — FENTANYL CITRATE (PF) 250 MCG/5ML IJ SOLN
INTRAMUSCULAR | Status: AC
  Filled 2021-03-12: qty 5

## 2021-03-12 MED ORDER — ORAL CARE MOUTH RINSE: 15.0000 mL | Freq: Once | OROMUCOSAL | Status: AC

## 2021-03-12 MED ORDER — PROPOFOL 10 MG/ML IV BOLUS
INTRAVENOUS | Status: DC | PRN
  Administered 2021-03-12: 200 mg via INTRAVENOUS

## 2021-03-12 MED ORDER — NALOXONE HCL 0.4 MG/ML IJ SOLN
INTRAMUSCULAR | Status: AC
  Filled 2021-03-12: qty 1

## 2021-03-12 MED ORDER — PROPOFOL 10 MG/ML IV BOLUS
INTRAVENOUS | Status: AC
  Filled 2021-03-12: qty 20

## 2021-03-12 MED ORDER — OXYMETAZOLINE HCL 0.05 % NA SOLN
NASAL | Status: AC
  Filled 2021-03-12: qty 30

## 2021-03-12 MED ORDER — ACETAMINOPHEN 160 MG/5ML PO SOLN: 325.0000 mg | ORAL | Status: DC | PRN

## 2021-03-12 MED ORDER — OXYCODONE HCL 5 MG PO TABS: 5.0000 mg | ORAL_TABLET | Freq: Once | ORAL | Status: DC | PRN

## 2021-03-12 MED ORDER — FENTANYL CITRATE (PF) 100 MCG/2ML IJ SOLN: 25.0000 ug | INTRAMUSCULAR | Status: DC | PRN

## 2021-03-12 MED ORDER — MIDAZOLAM HCL 2 MG/2ML IJ SOLN
INTRAMUSCULAR | Status: DC | PRN
  Administered 2021-03-12 (×2): 2 mg via INTRAVENOUS

## 2021-03-12 MED ORDER — LIDOCAINE-EPINEPHRINE 2 %-1:100000 IJ SOLN
INTRAMUSCULAR | Status: DC | PRN
Start: 2021-03-12 — End: 2021-03-12
  Administered 2021-03-12: 20 mL via INTRADERMAL

## 2021-03-12 MED ORDER — 0.9 % SODIUM CHLORIDE (POUR BTL) OPTIME
TOPICAL | Status: DC | PRN
  Administered 2021-03-12: 1000 mL

## 2021-03-12 MED ORDER — DEXAMETHASONE SODIUM PHOSPHATE 10 MG/ML IJ SOLN
INTRAMUSCULAR | Status: DC | PRN
  Administered 2021-03-12: 10 mg via INTRAVENOUS

## 2021-03-12 MED ORDER — ONDANSETRON HCL 4 MG/2ML IJ SOLN
INTRAMUSCULAR | Status: AC
  Filled 2021-03-12: qty 2

## 2021-03-12 MED ORDER — OXYCODONE HCL 5 MG/5ML PO SOLN: 5.0000 mg | Freq: Once | ORAL | Status: DC | PRN

## 2021-03-12 MED ORDER — CEFAZOLIN SODIUM-DEXTROSE 2-4 GM/100ML-% IV SOLN
INTRAVENOUS | Status: AC
  Filled 2021-03-12: qty 100

## 2021-03-12 MED ORDER — KETAMINE HCL 10 MG/ML IJ SOLN
INTRAMUSCULAR | Status: DC | PRN
  Administered 2021-03-12 (×2): 25 mg via INTRAVENOUS

## 2021-03-12 MED ORDER — CEFAZOLIN SODIUM-DEXTROSE 2-4 GM/100ML-% IV SOLN
2.0000 g | INTRAVENOUS | Status: AC
  Administered 2021-03-12: 2 g via INTRAVENOUS

## 2021-03-12 SURGICAL SUPPLY — 31 items
BAG COUNTER SPONGE SURGICOUNT (BAG) ×1 IMPLANT
BAG SPNG CNTER NS LX DISP (BAG) ×1
BLADE SURG 15 STRL LF DISP TIS (BLADE) ×1 IMPLANT
BLADE SURG 15 STRL SS (BLADE) ×2
BUR CROSS CUT FISSURE 1.6 (BURR) ×2 IMPLANT
BUR EGG ELITE 4.0 (BURR) ×2 IMPLANT
CANISTER SUCT 3000ML PPV (MISCELLANEOUS) ×2 IMPLANT
COVER SURGICAL LIGHT HANDLE (MISCELLANEOUS) ×2 IMPLANT
GAUZE PACKING FOLDED 2  STR (GAUZE/BANDAGES/DRESSINGS) ×2
GAUZE PACKING FOLDED 2 STR (GAUZE/BANDAGES/DRESSINGS) ×1 IMPLANT
GLOVE SURG ENC MOIS LTX SZ8 (GLOVE) ×2 IMPLANT
GOWN STRL REUS W/ TWL LRG LVL3 (GOWN DISPOSABLE) ×1 IMPLANT
GOWN STRL REUS W/ TWL XL LVL3 (GOWN DISPOSABLE) ×1 IMPLANT
GOWN STRL REUS W/TWL LRG LVL3 (GOWN DISPOSABLE) ×2
GOWN STRL REUS W/TWL XL LVL3 (GOWN DISPOSABLE) ×2
IV NS 1000ML (IV SOLUTION) ×2
IV NS 1000ML BAXH (IV SOLUTION) IMPLANT
KIT BASIN OR (CUSTOM PROCEDURE TRAY) ×2 IMPLANT
KIT TURNOVER KIT B (KITS) ×2 IMPLANT
NDL HYPO 25GX1X1/2 BEV (NEEDLE) ×2 IMPLANT
NEEDLE HYPO 25GX1X1/2 BEV (NEEDLE) ×2 IMPLANT
NS IRRIG 1000ML POUR BTL (IV SOLUTION) ×2 IMPLANT
PAD ARMBOARD 7.5X6 YLW CONV (MISCELLANEOUS) ×3 IMPLANT
SLEEVE IRRIGATION ELITE 7 (MISCELLANEOUS) ×2 IMPLANT
SPONGE SURGIFOAM ABS GEL 12-7 (HEMOSTASIS) IMPLANT
SUT CHROMIC 3 0 PS 2 (SUTURE) ×3 IMPLANT
SYR BULB IRRIG 60ML STRL (SYRINGE) ×2 IMPLANT
SYR CONTROL 10ML LL (SYRINGE) ×2 IMPLANT
TRAY ENT MC OR (CUSTOM PROCEDURE TRAY) ×2 IMPLANT
TUBING IRRIGATION (MISCELLANEOUS) ×2 IMPLANT
YANKAUER SUCT BULB TIP NO VENT (SUCTIONS) ×2 IMPLANT

## 2021-03-12 NOTE — Op Note (Signed)
03/12/2021  8:38 AM  PATIENT:  Tomasa Hose  36 y.o. male  PRE-OPERATIVE DIAGNOSIS:  NON-RESTORABLE TEETH #  2, 3, 4, 5, 12, 13, 14, 15,  18, 20, 21, 28, 29, 30; IMPACTED TEETH #1, 16.  POST-OPERATIVE DIAGNOSIS:  SAME  PROCEDURE:  Procedure(s): EXTRACTION TEETH # 1, 2, 3, 4, 5, 12, 13, 14, 15, 16, 18, 20, 21, 28, 29, 30; ALVEOLOPLASTY RIGHT AND LEFT MAXILLA, RIGHT MANDIBLE  SURGEON:  Surgeon(s): Ocie Doyne, DMD  ANESTHESIA:   local and general  EBL:  minimal  DRAINS: none   SPECIMEN:  No Specimen  COUNTS:  YES  PLAN OF CARE: Discharge to home after PACU  PATIENT DISPOSITION:  PACU - hemodynamically stable.   PROCEDURE DETAILS: Dictation # [id number]  Georgia Lopes, DMD 03/12/2021 8:38 AM

## 2021-03-12 NOTE — Anesthesia Postprocedure Evaluation (Signed)
Anesthesia Post Note  Patient: Jack [name]  Procedure(s) Performed: DENTAL RESTORATION/EXTRACTIONS     Patient location during evaluation: PACU Anesthesia Type: General Level of consciousness: awake and alert Pain management: pain level controlled Vital Signs Assessment: post-procedure vital signs reviewed and stable Respiratory status: spontaneous breathing, nonlabored ventilation, respiratory function stable and patient connected to nasal cannula oxygen Cardiovascular status: blood pressure returned to baseline and stable Postop Assessment: no apparent nausea or vomiting Anesthetic complications: no   No notable events documented.  Last Vitals:  Vitals:   03/12/21 1015 03/12/21 1030  BP: 126/90 (!) 133/93  Pulse: 74 73  Resp: 14 14  Temp:    SpO2: 97% 98%    Last Pain:  Vitals:   03/12/21 1015  TempSrc:   PainSc: Asleep                 [name]

## 2021-03-12 NOTE — Anesthesia Procedure Notes (Signed)
Procedure Name: Intubation Date/Time: 03/12/2021 7:34 AM Performed by: Reece Agar, CRNA Pre-anesthesia Checklist: Patient identified, Emergency Drugs available, Suction available and Patient being monitored Patient Re-evaluated:Patient Re-evaluated prior to induction Oxygen Delivery Method: Circle System Utilized Preoxygenation: Pre-oxygenation with 100% oxygen Induction Type: IV induction Ventilation: Mask ventilation without difficulty and Nasal airway inserted- appropriate to patient size Laryngoscope Size: Mac and 4 Grade View: Grade I Nasal Tubes: Nasal Rae, Nasal prep performed, Magill forceps- large, utilized and Right Number of attempts: 1 Placement Confirmation: ETT inserted through vocal cords under direct vision, positive ETCO2 and breath sounds checked- equal and bilateral Secured at: 27 cm Tube secured with: Tape Dental Injury: Teeth and Oropharynx as per pre-operative assessment

## 2021-03-12 NOTE — Transfer of Care (Signed)
Immediate Anesthesia Transfer of Care Note  Patient: Jack [name]  Procedure(s) Performed: DENTAL RESTORATION/EXTRACTIONS  Patient Location: PACU  Anesthesia Type:General  Level of Consciousness: drowsy  Airway & Oxygen Therapy: Patient Spontanous Breathing and Patient connected to face mask oxygen  Post-op Assessment: Report given to RN and Post -op Vital signs reviewed and stable  Post vital signs: Reviewed and stable  Last Vitals:  Vitals Value Taken Time  BP 139/87 03/12/21 0915  Temp    Pulse 78 03/12/21 0918  Resp 12 03/12/21 0918  SpO2 100 % 03/12/21 0918  Vitals shown include unvalidated device data.  Last Pain:  Vitals:   03/12/21 0635  TempSrc:   PainSc: 0-No pain      Patients Stated Pain Goal: 0 (03/10/21 1614)  Complications: No notable events documented.

## 2021-03-12 NOTE — H&P (Signed)
H&P documentation  -History and Physical Reviewed  -Patient has been re-examined  -No change in the plan of care  Kazuo Durnil  

## 2021-03-12 NOTE — Op Note (Signed)
Jack [name], Jack [name] MEDICAL RECORD NO: [id number] ACCOUNT NO: [id number] DATE OF BIRTH: Nov 25, 1985 FACILITY: [facility] LOCATION: [facility] PHYSICIAN: Georgia Lopes, DDS  Operative Report   DATE OF PROCEDURE: 03/12/2021  PREOPERATIVE DIAGNOSES:  Nonrestorable teeth numbers 2, 3, 4, 5, 12, 13, 14, 15, 18, 20, 21, 28, 29, 30; impacted teeth numbers 1 and 16.  POSTOPERATIVE DIAGNOSES:  Nonrestorable teeth numbers 2, 3, 4, 5, 12, 13, 14, 15, 18, 20, 21, 28, 29, 30; impacted teeth numbers 1 and 16.  PROCEDURE:  Extraction teeth 1, 2, 3, 4, 5, 12, 13, 14, 15, 16, 18, 20, 21, 28, 29, 30; alveoloplasty right and left maxilla and right mandible.  SURGEON:  Ocie Doyne, MD  ANESTHESIA:  General, nasal intubation.  Dr. Tacy Dura attending.  DESCRIPTION OF PROCEDURE:  The patient was taken to the operating room and placed on the table in supine position.  General anesthesia was administered.  A nasal endotracheal tube was placed and secured.  The eyes were protected.  The patient was draped  for surgery.  Timeout was performed.  The posterior pharynx was suctioned and a throat pack was placed.  2% lidocaine, 1:100,000 epinephrine was infiltrated in an inferior alveolar block on the right and left sides and in buccal and palatal infiltration  in the maxilla around the teeth to be removed.  A bite block was placed on the right side of the mouth.  A sweetheart retractor was used to retract the tongue.  A #15 blade was used to make an incision around tooth #18 and around teeth numbers 20 and 21.   The periosteum was reflected from around these teeth.  Tooth #18 was elevated with a 301 elevator and removed with the dental forceps.  Teeth numbers 20 and 21 fractured upon attempted removal after elevating them, necessitating reflection of the full  thickness flap, removal of circumferential bone and then elevating the roots of the teeth until the teeth were removed. The sockets were then curetted, irrigated and the  left mandible was closed with 3-0 chromic.  Attention was turned to the left  maxilla.  A 15 blade was used to make an incision overlying the area of tooth #16, carried forward in the buccal and palatal sulcus of teeth numbers 15, 14, 13 and 12.  The periosteum was reflected and then the Stryker handpiece was used to remove  interproximal bone between the teeth and separating the roots of teeth numbers 14 and 15 and the teeth and roots were elevated and removed from the mouth with dental forceps.  Then, the flap was reflected overlying tooth #16, which was impacted in the  bone.  Bone was removed using a Stryker handpiece, and then the tooth was removed with the 301 elevator.  The sockets were then curetted, because of the need for removal of bone and the decay and periodontal disease, the bony contour was irregular,  necessitating alveoloplasty so that the bone would be smooth for denture fabrication.  This was done using the egg bur followed by the bone file and then the area was irrigated and closed with 3-0 chromic.  The bite block was repositioned to the other  side of the mouth and the right side was operated, 15 blade used to make an incision around teeth numbers 1, 2, 3, 4, 5 in the maxilla and around teeth numbers 28, 29 and 30 in the mandible.  Periosteum was reflected.  The teeth were elevated,  interproximal bone was removed from between teeth  numbers 28, 29 and 30 and between teeth numbers 2, 3, 4 and 5.  Then, teeth were elevated and removed with dental forceps.  Then, bone was removed overlying tooth #1.  The tooth was removed using a 301  elevator.  Then, the sockets were curetted.  The periosteum was reflected to expose the alveolar crest, which was irregular in contour.  Therefore, alveoplasty was performed and required, using the egg bur.  Then, the area was irrigated and closed with  3-0 chromic.  The oral cavity was then irrigated and suctioned.  Additional local anesthesia was  administered and then the throat pack was removed.  The patient was left under the care of anesthesia for extubation and transport to recovery room with  plans for discharge home through day surgery.  EBL was minimum.  COMPLICATIONS:  None. There were no specimens.   SHW D: 03/12/2021 8:44:34 am T: 03/12/2021 9:15:00 am  JOB: [id number]/ [id number]

## 2021-03-12 NOTE — Progress Notes (Signed)
Patient stable but slow to arouse, called Dr. Tacy Dura which advised to monitor as he didn't think Narcan was needed.

## 2021-03-13 ENCOUNTER — Encounter (HOSPITAL_COMMUNITY): Payer: Self-pay | Admitting: Oral Surgery

## 2021-04-06 ENCOUNTER — Other Ambulatory Visit: Payer: Self-pay

## 2021-04-06 ENCOUNTER — Observation Stay (HOSPITAL_COMMUNITY)
Admission: EM | Admit: 2021-04-06 | Discharge: 2021-04-08 | Disposition: A | Discharge status: Home / Self Care | Payer: Medicare HMO | Attending: Internal Medicine | Admitting: Internal Medicine

## 2021-04-06 ENCOUNTER — Encounter (HOSPITAL_COMMUNITY): Payer: Self-pay

## 2021-04-06 ENCOUNTER — Emergency Department (HOSPITAL_COMMUNITY): Payer: Medicare HMO

## 2021-04-06 DIAGNOSIS — B9620 Unspecified Escherichia coli [E. coli] as the cause of diseases classified elsewhere: Secondary | ICD-10-CM | POA: Diagnosis not present

## 2021-04-06 DIAGNOSIS — Z20822 Contact with and (suspected) exposure to covid-19: Secondary | ICD-10-CM | POA: Diagnosis not present

## 2021-04-06 DIAGNOSIS — N3289 Other specified disorders of bladder: Secondary | ICD-10-CM | POA: Insufficient documentation

## 2021-04-06 DIAGNOSIS — L03314 Cellulitis of groin: Secondary | ICD-10-CM | POA: Diagnosis not present

## 2021-04-06 DIAGNOSIS — L0291 Cutaneous abscess, unspecified: Secondary | ICD-10-CM

## 2021-04-06 DIAGNOSIS — Z79899 Other long term (current) drug therapy: Secondary | ICD-10-CM | POA: Diagnosis not present

## 2021-04-06 DIAGNOSIS — G9341 Metabolic encephalopathy: Secondary | ICD-10-CM | POA: Diagnosis not present

## 2021-04-06 DIAGNOSIS — K5900 Constipation, unspecified: Secondary | ICD-10-CM | POA: Insufficient documentation

## 2021-04-06 DIAGNOSIS — A419 Sepsis, unspecified organism: Secondary | ICD-10-CM

## 2021-04-06 DIAGNOSIS — M6281 Muscle weakness (generalized): Secondary | ICD-10-CM | POA: Insufficient documentation

## 2021-04-06 DIAGNOSIS — N419 Inflammatory disease of prostate, unspecified: Secondary | ICD-10-CM

## 2021-04-06 DIAGNOSIS — F250 Schizoaffective disorder, bipolar type: Secondary | ICD-10-CM | POA: Diagnosis present

## 2021-04-06 DIAGNOSIS — N390 Urinary tract infection, site not specified: Secondary | ICD-10-CM | POA: Diagnosis not present

## 2021-04-06 DIAGNOSIS — F79 Unspecified intellectual disabilities: Secondary | ICD-10-CM

## 2021-04-06 DIAGNOSIS — Z91040 Latex allergy status: Secondary | ICD-10-CM | POA: Insufficient documentation

## 2021-04-06 DIAGNOSIS — L0390 Cellulitis, unspecified: Secondary | ICD-10-CM | POA: Diagnosis present

## 2021-04-06 DIAGNOSIS — R5383 Other fatigue: Secondary | ICD-10-CM | POA: Diagnosis present

## 2021-04-06 LAB — CBC WITH DIFFERENTIAL/PLATELET
Abs Immature Granulocytes: 0.12 10*3/uL — ABNORMAL HIGH (ref 0.00–0.07)
Basophils Absolute: 0.1 10*3/uL (ref 0.0–0.1)
Basophils Relative: 0 %
Eosinophils Absolute: 0.1 10*3/uL (ref 0.0–0.5)
Eosinophils Relative: 0 %
HCT: 37.6 % — ABNORMAL LOW (ref 39.0–52.0)
Hemoglobin: 12.6 g/dL — ABNORMAL LOW (ref 13.0–17.0)
Immature Granulocytes: 1 %
Lymphocytes Relative: 8 %
Lymphs Abs: 1.9 10*3/uL (ref 0.7–4.0)
MCH: 29.4 pg (ref 26.0–34.0)
MCHC: 33.5 g/dL (ref 30.0–36.0)
MCV: 87.9 fL (ref 80.0–100.0)
Monocytes Absolute: 2.1 10*3/uL — ABNORMAL HIGH (ref 0.1–1.0)
Monocytes Relative: 9 %
Neutro Abs: 19.3 10*3/uL — ABNORMAL HIGH (ref 1.7–7.7)
Neutrophils Relative %: 82 %
Platelets: 179 10*3/uL (ref 150–400)
RBC: 4.28 MIL/uL (ref 4.22–5.81)
RDW: 15.8 % — ABNORMAL HIGH (ref 11.5–15.5)
WBC: 23.5 10*3/uL — ABNORMAL HIGH (ref 4.0–10.5)
nRBC: 0 % (ref 0.0–0.2)

## 2021-04-06 LAB — URINALYSIS, ROUTINE W REFLEX MICROSCOPIC
Bacteria, UA: NONE SEEN
Bilirubin Urine: NEGATIVE
Glucose, UA: NEGATIVE mg/dL
Ketones, ur: 5 mg/dL — AB
Nitrite: NEGATIVE
Protein, ur: 100 mg/dL — AB
Specific Gravity, Urine: 1.021 (ref 1.005–1.030)
WBC, UA: >50 WBC/hpf — ABNORMAL HIGH (ref 0–5)
pH: 5 (ref 5.0–8.0)

## 2021-04-06 LAB — BASIC METABOLIC PANEL
Anion gap: 9 (ref 5–15)
BUN: 19 mg/dL (ref 6–20)
CO2: 27 mmol/L (ref 22–32)
Calcium: 9.7 mg/dL (ref 8.9–10.3)
Chloride: 97 mmol/L — ABNORMAL LOW (ref 98–111)
Creatinine, Ser: 0.97 mg/dL (ref 0.61–1.24)
GFR, Estimated: >60 mL/min (ref >60)
Glucose, Bld: 130 mg/dL — ABNORMAL HIGH (ref 70–99)
Potassium: 4.1 mmol/L (ref 3.5–5.1)
Sodium: 133 mmol/L — ABNORMAL LOW (ref 135–145)

## 2021-04-06 LAB — RESP PANEL BY RT-PCR (FLU A&B, COVID) ARPGX2
Influenza A by PCR: NEGATIVE
Influenza B by PCR: NEGATIVE
SARS Coronavirus 2 by RT PCR: NEGATIVE

## 2021-04-06 LAB — TROPONIN I (HIGH SENSITIVITY): Troponin I (High Sensitivity): <2 ng/L (ref <18)

## 2021-04-06 LAB — LACTIC ACID, PLASMA: Lactic Acid, Venous: 1.4 mmol/L (ref 0.5–1.9)

## 2021-04-06 MED ORDER — LACTATED RINGERS IV SOLN: INTRAVENOUS | Status: DC

## 2021-04-06 MED ORDER — CLINDAMYCIN PHOSPHATE 600 MG/50ML IV SOLN
600.0000 mg | Freq: Once | INTRAVENOUS | Status: AC
  Administered 2021-04-06: 600 mg via INTRAVENOUS
  Filled 2021-04-06: qty 50

## 2021-04-06 MED ORDER — SODIUM CHLORIDE 0.9 % IV SOLN
2.0000 g | Freq: Once | INTRAVENOUS | Status: AC
  Administered 2021-04-06: 2 g via INTRAVENOUS
  Filled 2021-04-06: qty 2

## 2021-04-06 MED ORDER — HYDROCORTISONE SOD SUC (PF) 250 MG IJ SOLR
200.0000 mg | Freq: Once | INTRAMUSCULAR | Status: AC
  Administered 2021-04-06: 200 mg via INTRAVENOUS
  Filled 2021-04-06: qty 200

## 2021-04-06 MED ORDER — LACTATED RINGERS IV BOLUS (SEPSIS)
1000.0000 mL | Freq: Once | INTRAVENOUS | Status: AC
  Administered 2021-04-06: 1000 mL via INTRAVENOUS

## 2021-04-06 MED ORDER — LACTATED RINGERS IV BOLUS (SEPSIS)
500.0000 mL | Freq: Once | INTRAVENOUS | Status: AC
  Administered 2021-04-06: 500 mL via INTRAVENOUS

## 2021-04-06 MED ORDER — VANCOMYCIN HCL 1750 MG/350ML IV SOLN
1750.0000 mg | Freq: Once | INTRAVENOUS | Status: AC
  Administered 2021-04-06: 1750 mg via INTRAVENOUS
  Filled 2021-04-06: qty 350

## 2021-04-06 MED ORDER — DIPHENHYDRAMINE HCL 50 MG/ML IJ SOLN
50.0000 mg | Freq: Once | INTRAMUSCULAR | Status: AC
  Administered 2021-04-06: 50 mg via INTRAVENOUS
  Filled 2021-04-06: qty 1

## 2021-04-06 MED ORDER — LORAZEPAM 2 MG/ML IJ SOLN
1.0000 mg | INTRAMUSCULAR | Status: DC | PRN
  Filled 2021-04-06: qty 1

## 2021-04-06 MED ORDER — DIPHENHYDRAMINE HCL 25 MG PO CAPS
50.0000 mg | ORAL_CAPSULE | Freq: Once | ORAL | Status: AC
  Filled 2021-04-06: qty 2

## 2021-04-06 MED ORDER — VANCOMYCIN HCL IN DEXTROSE 1-5 GM/200ML-% IV SOLN: 1000.0000 mg | Freq: Once | INTRAVENOUS | Status: DC

## 2021-04-06 NOTE — ED Provider Notes (Addendum)
[facility]-EMERGENCY DEPT Provider Note   CSN: [id number] Arrival date & time: 04/06/21  1829     History  No chief complaint on file.   Jack [name] is a 36 y.o. male.  HPI  36 year old male with a history of autism spectrum disorder who presents due to change in mental status.  Per caregiver and parents, the patient had become more lethargic today, not at his baseline mental status.  He had dental work around 3 weeks ago and has been otherwise well-appearing recently.  He developed a "boil" on his right thigh/groin/inguinal crease area which he will not let anyone touch.  He has had no fevers or chills at home.  He does endorse some groin pain.  Home Medications Prior to Admission medications   Medication Sig Start Date End Date Taking? Authorizing Provider  amoxicillin (AMOXIL) 500 MG capsule Take 1 capsule (500 mg total) by mouth 3 (three) times daily. 03/12/21   Ocie Doyne, DMD  cloZAPine (CLOZARIL) 100 MG tablet Take 1 tablet (100 mg total) by mouth 2 (two) times daily. For mood control Patient taking differently: Take 100-350 mg by mouth See admin instructions. Take 100 mg in the morning and afternoon and 350 mg at bedtime 02/20/17   Armandina Stammer I, NP  cloZAPine (CLOZARIL) 50 MG tablet Take 7 tablets (350 mg total) by mouth at bedtime. For mood control Patient not taking: Reported on 05/21/2019 02/20/17   Armandina Stammer I, NP  divalproex (DEPAKOTE) 500 MG DR tablet Take 4 tablets (2,000 mg total) by mouth daily. For mood stabilization/seizures 02/20/17   Armandina Stammer I, NP  DOK 100 MG capsule TK 1 Capsul PO BID AS NEEDED FOR CONSTIPATION 02/20/17   Armandina Stammer I, NP  gabapentin (NEURONTIN) 300 MG capsule Take 1 capsule (300 mg total) by mouth 3 (three) times daily. For agitation 02/20/17   Armandina Stammer I, NP  hydrOXYzine (ATARAX/VISTARIL) 50 MG tablet Take 1 tablet (50 mg total) by mouth every 6 (six) hours as needed for anxiety. Patient taking  differently: Take 50 mg by mouth in the morning, at noon, in the evening, and at bedtime. 02/20/17   Armandina Stammer I, NP  lithium carbonate (ESKALITH) 450 MG CR tablet Take 2 tablets (900 mg total) by mouth daily. For mood stabilization 02/20/17   Armandina Stammer I, NP  LORazepam (ATIVAN) 1 MG tablet Take 1 Tablet  PO TID for anxiety 02/20/17   Armandina Stammer I, NP  magnesium oxide (MAG-OX) 400 (241.3 Mg) MG tablet Take 1 tablet (400 mg total) by mouth daily. For constipation 02/21/17   Armandina Stammer I, NP  oxyCODONE-acetaminophen (PERCOCET) 5-325 MG tablet Take 1 tablet by mouth every 4 (four) hours as needed. 03/12/21   Ocie Doyne, DMD      Allergies    Iodinated contrast media, Latex, and Shellfish-derived products    Review of Systems   Review of Systems  All other systems reviewed and are negative.  Physical Exam Updated Vital Signs BP 122/89    Pulse 90    Temp 98.9 F (37.2 C) (Oral)    Resp 16    Ht 5\' 11"  (1.803 m)    Wt 77.1 kg    SpO2 100%    BMI 23.71 kg/m  Physical Exam Vitals and nursing note reviewed. Exam conducted with a chaperone present.  Constitutional:      General: He is not in acute distress. HENT:     Head: Normocephalic and atraumatic.  Mouth/Throat:     Mouth: Mucous membranes are dry.  Eyes:     Conjunctiva/sclera: Conjunctivae normal.     Pupils: Pupils are equal, round, and reactive to light.  Cardiovascular:     Rate and Rhythm: Regular rhythm. Tachycardia present.  Pulmonary:     Effort: Pulmonary effort is normal. No respiratory distress.  Abdominal:     General: There is no distension.     Tenderness: There is no guarding.  Genitourinary:    Testes:        Right: Tenderness and swelling present.     Comments: Right groin/inguinal crease area of induration and significant tenderness to palpation that extends to the right side of the scrotum.  The patient is able to lift his scrotum without significant pain but endorses tenderness and pain on  palpation of the posterior/superior aspect of the scrotum. Musculoskeletal:        General: No deformity or signs of injury.     Cervical back: Neck supple.  Skin:    Findings: No lesion or rash.  Neurological:     General: No focal deficit present.     Mental Status: He is alert. Mental status is at baseline.    ED Results / Procedures / Treatments   Labs (all labs ordered are listed, but only abnormal results are displayed) Labs Reviewed  BASIC METABOLIC PANEL - Abnormal; Notable for the following components:      Result Value   Sodium 133 (*)    Chloride 97 (*)    Glucose, Bld 130 (*)    All other components within normal limits  CBC WITH DIFFERENTIAL/PLATELET - Abnormal; Notable for the following components:   WBC 23.5 (*)    Hemoglobin 12.6 (*)    HCT 37.6 (*)    RDW 15.8 (*)    Neutro Abs 19.3 (*)    Monocytes Absolute 2.1 (*)    Abs Immature Granulocytes 0.12 (*)    All other components within normal limits  URINALYSIS, ROUTINE W REFLEX MICROSCOPIC - Abnormal; Notable for the following components:   APPearance CLOUDY (*)    Hgb urine dipstick SMALL (*)    Ketones, ur 5 (*)    Protein, ur 100 (*)    Leukocytes,Ua LARGE (*)    WBC, UA >50 (*)    All other components within normal limits  RESP PANEL BY RT-PCR (FLU A&B, COVID) ARPGX2  CULTURE, BLOOD (SINGLE)  CULTURE, BLOOD (ROUTINE X 2)  CULTURE, BLOOD (ROUTINE X 2)  URINE CULTURE  LACTIC ACID, PLASMA  LACTIC ACID, PLASMA  TROPONIN I (HIGH SENSITIVITY)  TROPONIN I (HIGH SENSITIVITY)    EKG None  Radiology DG Chest 2 View  Result Date: 04/06/2021 CLINICAL DATA:  Cough, lethargy EXAM: CHEST - 2 VIEW COMPARISON:  05/28/2010 FINDINGS: Frontal and lateral views of the chest demonstrate an unremarkable cardiac silhouette. No acute airspace disease, effusion, or pneumothorax. No acute bony abnormalities. Moderate retained stool within the transverse colon. IMPRESSION: 1. No acute intrathoracic process.  Electronically Signed   By: Sharlet SalinaMichael  Brown M.D.   On: 04/06/2021 19:23    Procedures .Critical Care Performed by: Ernie AvenaLawsing, Alayasia Breeding, MD Authorized by: Ernie AvenaLawsing, Jamillia Closson, MD   Critical care provider statement:    Critical care time (minutes):  30   Critical care was necessary to treat or prevent imminent or life-threatening deterioration of the following conditions:  Sepsis   Critical care was time spent personally by me on the following activities:  Development of treatment plan  with patient or surrogate, discussions with consultants, evaluation of patient's response to treatment, examination of patient, ordering and review of laboratory studies, ordering and review of radiographic studies, ordering and performing treatments and interventions, pulse oximetry, re-evaluation of patient's condition and review of old charts    Medications Ordered in ED Medications  lactated ringers infusion (has no administration in time range)  lactated ringers bolus 1,000 mL (1,000 mLs Intravenous New Bag/Given 04/06/21 2105)    And  lactated ringers bolus 1,000 mL (has no administration in time range)    And  lactated ringers bolus 500 mL (has no administration in time range)  ceFEPIme (MAXIPIME) 2 g in sodium chloride 0.9 % 100 mL IVPB (2 g Intravenous New Bag/Given 04/06/21 2104)  clindamycin (CLEOCIN) IVPB 600 mg (has no administration in time range)  hydrocortisone sodium succinate (SOLU-CORTEF) injection 200 mg (has no administration in time range)  diphenhydrAMINE (BENADRYL) capsule 50 mg (has no administration in time range)    Or  diphenhydrAMINE (BENADRYL) injection 50 mg (has no administration in time range)  vancomycin (VANCOREADY) IVPB 1750 mg/350 mL (has no administration in time range)    ED Course/ Medical Decision Making/ A&P Clinical Course as of 04/06/21 2108  Tue Apr 06, 2021  2047 WBC(!): 23.5 [JL]  2047 Pulse Rate(!): 110 [JL]  2100 WBC, UA(!): >50 [JL]  2100 Leukocytes,Ua(!): LARGE  [JL]    Clinical Course User Index [JL] Ernie AvenaLawsing, Oliana Gowens, MD                           Medical Decision Making Amount and/or Complexity of Data Reviewed Labs: ordered. Decision-making details documented in ED Course. Radiology: ordered.  Risk Prescription drug management.   36 year old male with a history of autism spectrum disorder who presents due to change in mental status.  Per caregiver and parents, the patient had become more lethargic today, not at his baseline mental status.  He had dental work around 3 weeks ago and has been otherwise well-appearing recently.  He developed a "boil" on his right thigh/groin/inguinal crease area which he will not let anyone touch.  He has had no fevers or chills at home.  He does endorse some groin pain.  On arrival, the patient was afebrile, tachycardic P1 10, sinus tachycardia noted on cardiac telemetry, hemodynamically stable BP 117/86, saturating 99% on room air.  The patient presents with concern for a right groin area of induration and pain.  Differential diagnosis includes groin cellulitis, scrotal cellulitis, groin abscess, necrotizing fasciitis.  Given the patient's tachycardia, initial leukocytosis 23.5 from triage labs, concern for sepsis on my evaluation so code sepsis was initiated.  IV access was obtained the patient was administered 30 cc/kg of IV fluids lactated Ringer's.  He was covered with broad-spectrum antibiotics to include vancomycin, cefepime and IV clindamycin to cover for possible toxin formation in the event that he has necrotizing fasciitis of the groin/Fournier's gangrene.  Will obtain CT abdomen pelvis to evaluate for Fournier's gangrene or other infectious process of the abdomen/groin.  Laboratory work-up initiated to include blood cultures x2, urinalysis/urine culture, lactic acid, COVID-19 and influenza PCR testing which was collected and resulted negative, CBC which revealed a leukocytosis to 23.5, anemia to 12.6, BMP with  mild hyponatremia to 133, mild hyperglycemia to 130, no evidence of AKI, urinalysis without clear evidence of UTI with large leukocytes, greater than 50 WBCs, negative nitrites.  No bacteria seen.  Of note, the patient has  a contrast allergy and required premedication prior to contrast administration for CT abdomen pelvis.  On reassessment, the patient appears symptomatically improved following volume resuscitation.  He was no longer tachycardic.  He appeared hemodynamically stable.  CT abdomen pelvis was still pending.  Plan at time of signout to follow-up patient CT abdomen pelvis with a plan to discuss further management pending results.  Signout given to Dr. Pilar Plate at 0000.   Final Clinical Impression(s) / ED Diagnoses Final diagnoses:  None    Rx / DC Orders ED Discharge Orders     None         Ernie Avena, MD 04/07/21 [facility]    Ernie Avena, MD 04/07/21 [phone]

## 2021-04-06 NOTE — ED Triage Notes (Signed)
"  Has several mental health issues and high functioning autism, lives with mother, call came out today for patient being lethargic, mother worried because he has had dental work recently and a boil on his thigh he won't let anyone touch" per EMS

## 2021-04-06 NOTE — ED Notes (Signed)
Pt NAD in bed with mother at bedside. Pt has high functioning autism and cannot relate what is wrong. BIB mother because she noticed he has been lethargic all day. Pt also has swelling, redness and tenderness to lower abd and groin. Left leg redness noted as well.

## 2021-04-06 NOTE — ED Provider Triage Note (Signed)
Emergency Medicine Provider Triage Evaluation Note  Jack [name] , a 36 y.o. male  was evaluated in triage.  Pt sent to the emergency department for evaluation of feeling unwell.  Patient does have high functioning autism and is at a group home.  Patient was sent as he was having some intermittent vomiting and cough.  No fevers or chills.  Patient did recently have 16 teeth extracted. Also complaining of a wound over his leg.   Review of Systems  Positive:  Negative: See above   Physical Exam  BP 117/86 (BP Location: Left Arm)    Pulse (!) 110    Temp 98.9 F (37.2 C) (Oral)    Resp 18    SpO2 99%  Gen:   Awake, no distress   Resp:  Normal effort  MSK:   Moves extremities without difficulty  Other:  Erythematous rash to the right groin.  Medical Decision Making  Medically screening exam initiated at 6:50 PM.  Appropriate orders placed.  Tomasa Hose was informed that the remainder of the evaluation will be completed by another provider, this initial triage assessment does not replace that evaluation, and the importance of remaining in the ED until their evaluation is complete.     Honor Loh [place], [place] 04/06/21 1851

## 2021-04-06 NOTE — Progress Notes (Signed)
A consult was received from an ED physician for cefepime and vancomycin per pharmacy dosing.  The patient's profile has been reviewed for ht/wt/allergies/indication/available labs.    A one time order has been placed for cefepime 2 g and vancomycin 1750 mg IV.  Further antibiotics/pharmacy consults should be ordered by admitting physician if indicated.                       Thank you, Selinda Eon, PharmD, BCPS Clinical Pharmacist [place] Please utilize Amion for appropriate phone number to reach the unit pharmacist [facility]) 04/06/2021 9:02 PM

## 2021-04-07 ENCOUNTER — Encounter (HOSPITAL_COMMUNITY): Payer: Self-pay

## 2021-04-07 ENCOUNTER — Emergency Department (HOSPITAL_COMMUNITY): Payer: Medicare HMO

## 2021-04-07 DIAGNOSIS — L0390 Cellulitis, unspecified: Secondary | ICD-10-CM | POA: Diagnosis present

## 2021-04-07 DIAGNOSIS — G9341 Metabolic encephalopathy: Secondary | ICD-10-CM | POA: Diagnosis not present

## 2021-04-07 DIAGNOSIS — L03818 Cellulitis of other sites: Secondary | ICD-10-CM | POA: Diagnosis not present

## 2021-04-07 DIAGNOSIS — F250 Schizoaffective disorder, bipolar type: Secondary | ICD-10-CM

## 2021-04-07 DIAGNOSIS — N390 Urinary tract infection, site not specified: Secondary | ICD-10-CM | POA: Diagnosis not present

## 2021-04-07 LAB — BASIC METABOLIC PANEL
Anion gap: 5 (ref 5–15)
BUN: 15 mg/dL (ref 6–20)
CO2: 25 mmol/L (ref 22–32)
Calcium: 9.2 mg/dL (ref 8.9–10.3)
Chloride: 107 mmol/L (ref 98–111)
Creatinine, Ser: 0.7 mg/dL (ref 0.61–1.24)
GFR, Estimated: >60 mL/min (ref >60)
Glucose, Bld: 127 mg/dL — ABNORMAL HIGH (ref 70–99)
Potassium: 4.6 mmol/L (ref 3.5–5.1)
Sodium: 137 mmol/L (ref 135–145)

## 2021-04-07 LAB — CBC
HCT: 32.7 % — ABNORMAL LOW (ref 39.0–52.0)
Hemoglobin: 10.8 g/dL — ABNORMAL LOW (ref 13.0–17.0)
MCH: 28.9 pg (ref 26.0–34.0)
MCHC: 33 g/dL (ref 30.0–36.0)
MCV: 87.4 fL (ref 80.0–100.0)
Platelets: 161 10*3/uL (ref 150–400)
RBC: 3.74 MIL/uL — ABNORMAL LOW (ref 4.22–5.81)
RDW: 15.9 % — ABNORMAL HIGH (ref 11.5–15.5)
WBC: 16.9 10*3/uL — ABNORMAL HIGH (ref 4.0–10.5)
nRBC: 0 % (ref 0.0–0.2)

## 2021-04-07 LAB — TROPONIN I (HIGH SENSITIVITY): Troponin I (High Sensitivity): 3 ng/L (ref <18)

## 2021-04-07 LAB — HIV ANTIBODY (ROUTINE TESTING W REFLEX): HIV Screen 4th Generation wRfx: NONREACTIVE

## 2021-04-07 LAB — MAGNESIUM: Magnesium: 2.2 mg/dL (ref 1.7–2.4)

## 2021-04-07 MED ORDER — MAGNESIUM OXIDE -MG SUPPLEMENT 400 (240 MG) MG PO TABS
400.0000 mg | ORAL_TABLET | Freq: Every day | ORAL | Status: DC
  Administered 2021-04-07 – 2021-04-08 (×2): 400 mg via ORAL
  Filled 2021-04-07 (×2): qty 1

## 2021-04-07 MED ORDER — SODIUM CHLORIDE 0.9 % IV SOLN
2.0000 g | INTRAVENOUS | Status: DC
  Administered 2021-04-07 – 2021-04-08 (×2): 2 g via INTRAVENOUS
  Filled 2021-04-07 (×2): qty 20

## 2021-04-07 MED ORDER — CLOZAPINE 100 MG PO TABS
100.0000 mg | ORAL_TABLET | ORAL | Status: DC
  Administered 2021-04-07 – 2021-04-08 (×4): 100 mg via ORAL
  Filled 2021-04-07 (×5): qty 1

## 2021-04-07 MED ORDER — DOCUSATE SODIUM 100 MG PO CAPS
100.0000 mg | ORAL_CAPSULE | Freq: Two times a day (BID) | ORAL | Status: DC
  Administered 2021-04-07 – 2021-04-08 (×3): 100 mg via ORAL
  Filled 2021-04-07 (×3): qty 1

## 2021-04-07 MED ORDER — CLOZAPINE 100 MG PO TABS: 100.0000 mg | ORAL_TABLET | ORAL | Status: DC

## 2021-04-07 MED ORDER — LORAZEPAM 1 MG PO TABS: 1.0000 mg | ORAL_TABLET | Freq: Three times a day (TID) | ORAL | Status: DC

## 2021-04-07 MED ORDER — ENOXAPARIN SODIUM 40 MG/0.4ML IJ SOSY
40.0000 mg | PREFILLED_SYRINGE | INTRAMUSCULAR | Status: DC
  Administered 2021-04-07 – 2021-04-08 (×2): 40 mg via SUBCUTANEOUS
  Filled 2021-04-07 (×2): qty 0.4

## 2021-04-07 MED ORDER — LACTATED RINGERS IV SOLN: INTRAVENOUS | Status: AC

## 2021-04-07 MED ORDER — DIPHENHYDRAMINE HCL 50 MG/ML IJ SOLN
50.0000 mg | Freq: Once | INTRAMUSCULAR | Status: AC
  Administered 2021-04-07: 50 mg via INTRAVENOUS
  Filled 2021-04-07: qty 1

## 2021-04-07 MED ORDER — DIVALPROEX SODIUM 250 MG PO DR TAB
2000.0000 mg | DELAYED_RELEASE_TABLET | Freq: Every day | ORAL | Status: DC
  Administered 2021-04-07 – 2021-04-08 (×2): 2000 mg via ORAL
  Filled 2021-04-07: qty 8
  Filled 2021-04-07: qty 4

## 2021-04-07 MED ORDER — ACETAMINOPHEN 650 MG RE SUPP: 650.0000 mg | Freq: Four times a day (QID) | RECTAL | Status: DC | PRN

## 2021-04-07 MED ORDER — POLYETHYLENE GLYCOL 3350 17 G PO PACK: 17.0000 g | PACK | Freq: Every day | ORAL | Status: DC | PRN

## 2021-04-07 MED ORDER — BISACODYL 5 MG PO TBEC: 5.0000 mg | DELAYED_RELEASE_TABLET | Freq: Every day | ORAL | Status: DC | PRN

## 2021-04-07 MED ORDER — ACETAMINOPHEN 325 MG PO TABS: 650.0000 mg | ORAL_TABLET | Freq: Four times a day (QID) | ORAL | Status: DC | PRN

## 2021-04-07 MED ORDER — CLOZAPINE 25 MG PO TABS
350.0000 mg | ORAL_TABLET | Freq: Every day | ORAL | Status: DC
  Administered 2021-04-07: 350 mg via ORAL
  Filled 2021-04-07 (×2): qty 2

## 2021-04-07 MED ORDER — GABAPENTIN 300 MG PO CAPS
300.0000 mg | ORAL_CAPSULE | Freq: Three times a day (TID) | ORAL | Status: DC
  Administered 2021-04-07 – 2021-04-08 (×5): 300 mg via ORAL
  Filled 2021-04-07 (×5): qty 1

## 2021-04-07 MED ORDER — HYDROXYZINE HCL 25 MG PO TABS: 50.0000 mg | ORAL_TABLET | Freq: Four times a day (QID) | ORAL | Status: DC | PRN

## 2021-04-07 MED ORDER — IOHEXOL 300 MG/ML  SOLN
100.0000 mL | Freq: Once | INTRAMUSCULAR | Status: AC | PRN
  Administered 2021-04-07: 100 mL via INTRAVENOUS

## 2021-04-07 MED ORDER — LITHIUM CARBONATE ER 450 MG PO TBCR
900.0000 mg | EXTENDED_RELEASE_TABLET | Freq: Every day | ORAL | Status: DC
  Administered 2021-04-07 – 2021-04-08 (×2): 900 mg via ORAL
  Filled 2021-04-07 (×2): qty 2

## 2021-04-07 NOTE — Hospital Course (Signed)
istory significant for schizoaffective disorder, intellectual disability, dental restorations and extractions on March 12, 2021, and recurrent boils in the right groin presents with 2 days history of lethargy right groin pain, not eating from group home.  In ED-afebrile, saturating well on room air, tachycardic to [id number], and with systolic blood pressure 98 and higher.  LABS NA-133 and CBC features a leukocytosis 23,500.  Lactic acid was normal.  Urinalysis with sterile pyuria and large leukocytes.  Cxr neg ,CT of the abdomen and pelvis notable for heterogenous enhancement of the prostate, mildly thickened urinary bladder wall, and moderate colonic stool burden.  Blood and urine cultures were collected and the patient was given 2.5 L of LR, cefepime, clindamycin, and vancomycin.   Patient was admitted for cellulitis on right groin and UTI

## 2021-04-07 NOTE — ED Provider Notes (Signed)
°  Provider Note MRN:  [id number]  Arrival date & time: 04/07/21    ED Course and Medical Decision Making  Assumed care from Dr. Armandina Gemma at shift change.  Concern for soft tissue sepsis, has a cellulitis to the inguinal region.  Markedly elevated WBC count, awaiting CT scan prior to admission.  CT is without evidence of necrotizing infection, admitted to medicine.  Procedures  Final Clinical Impressions(s) / ED Diagnoses     ICD-10-CM   1. Sepsis, due to unspecified organism, unspecified whether acute organ dysfunction present [facility])  A41.9       ED Discharge Orders     None       Discharge Instructions   None     Barth Kirks. [place] Small, [place] [email or web address]    Maudie Flakes, MD 04/07/21 [phone]

## 2021-04-07 NOTE — ED Notes (Signed)
Patient transported to CT 

## 2021-04-07 NOTE — Progress Notes (Signed)
° ° °  Briefly, 36 y.o. male with medical history significant for schizoaffective disorder, intellectual disability, dental restorations and extractions on March 12, 2021, and recurrent boils in the right groin presents with 2 days history of lethargy right groin pain, not eating from group home.  In ED-afebrile, saturating well on room air, tachycardic to [id number], and with systolic blood pressure 98 and higher.  LABS NA-133 and CBC features a leukocytosis 23,500.  Lactic acid was normal.  Urinalysis with sterile pyuria and large leukocytes.  Cxr neg ,CT of the abdomen and pelvis notable for heterogenous enhancement of the prostate, mildly thickened urinary bladder wall, and moderate colonic stool burden.  Blood and urine cultures were collected and the patient was given 2.5 L of LR, cefepime, clindamycin, and vancomycin.   Patient was admitted for cellulitis versus UTI  Overnight WBC improving. Hemodynamically stable afebrile  Alert awake oriented x3, mother at bedside Rt groin not much pain ful but still vERY tender to touch, has area of induration erythema swelling   A/P Acute metabolic encephalopathy/lethargy.  Resolved  Right groin cellulitis UTI: Continue with current antibiotics follow culture sensitivity report from blood and urine continue right groin wound care  Signs of active disorder/intellectual disability/living in group home: Resume home meds  poc discussed with patient's mother at the bedside

## 2021-04-07 NOTE — H&P (Signed)
History and Physical    Jack [name] [id number] DOB: 1985/12/15 DOA: 04/06/2021  PCP: Arthur Holms, NP   Patient coming from: Home   Chief Complaint: Lethargy, right groin pain   HPI: Jack [name] is a pleasant 36 y.o. male with medical history significant for schizoaffective disorder, intellectual disability, dental restorations and extractions on March 12, 2021, and recurrent boils in the right groin who presents the emergency department with 2 days of lethargy and right groin pain.  Patient's mother is at the bedside and assists with the history, noting that she was told by the group home that the patient had not been eating for the past 1 to 2 days.  When she went to see him, he appeared quite lethargic and generally unwell.  He was complaining of pain in his right groin.  He had been doing well following the recent dental procedure and had been eating okay up until a couple days ago.  In the ED, the patient denies any mouth pain but reports pain in the right groin and gestures to the perineum and suprapubic region as sites of pain.  History is somewhat difficult due to the patient's chronic issues and while he did not initially complain of dysuria, he says "yes it burns a lot" when asked specifically.   ED Course: Upon arrival to the ED, patient is found to be afebrile, saturating well on room air, tachycardic to [id number], and with systolic blood pressure 98 and higher.  Chemistry panel notable for sodium 133 and CBC features a leukocytosis 23,500.  Lactic acid was normal.  Urinalysis with sterile pyuria and large leukocytes.  Chest x-ray negative for acute findings.  CT of the abdomen and pelvis notable for heterogenous enhancement of the prostate, mildly thickened urinary bladder wall, and moderate colonic stool burden.  Blood and urine cultures were collected and the patient was given 2.5 L of LR, cefepime, clindamycin, and vancomycin.  He received pretreatment for contrast allergy in the  ED.  Review of Systems:  All other systems reviewed and apart from HPI, are negative.  Past Medical History:  Diagnosis Date   Bipolar 1 disorder ([place])    Intellectual disability    Mental retardation    Schizophrenia, schizo-affective ([place])    Seizures ([place])     Past Surgical History:  Procedure Laterality Date   APPENDECTOMY     TOOTH EXTRACTION N/A 03/12/2021   Procedure: DENTAL RESTORATION/EXTRACTIONS;  Surgeon: Diona Browner, DMD;  Location: [facility];  Service: Oral Surgery;  Laterality: N/A;    Social History:   reports that he has never smoked. He has never been exposed to tobacco smoke. He has never used smokeless tobacco. He reports that he does not drink alcohol and does not use drugs.  Allergies  Allergen Reactions   Iodinated Contrast Media Other (See Comments)    Reaction unknown--per [facility].     Latex Rash   Shellfish-Derived Products Rash    History reviewed. No pertinent family history.   Prior to Admission medications   Medication Sig Start Date End Date Taking? Authorizing Provider  amoxicillin (AMOXIL) 500 MG capsule Take 1 capsule (500 mg total) by mouth 3 (three) times daily. 03/12/21   Diona Browner, DMD  cloZAPine (CLOZARIL) 100 MG tablet Take 1 tablet (100 mg total) by mouth 2 (two) times daily. For mood control Patient taking differently: Take 100-350 mg by mouth See admin instructions. Take 100 mg in the morning and afternoon and 350 mg at  bedtime 02/20/17   Lindell Spar I, NP  cloZAPine (CLOZARIL) 50 MG tablet Take 7 tablets (350 mg total) by mouth at bedtime. For mood control Patient not taking: Reported on 05/21/2019 02/20/17   Lindell Spar I, NP  divalproex (DEPAKOTE) 500 MG DR tablet Take 4 tablets (2,000 mg total) by mouth daily. For mood stabilization/seizures 02/20/17   Lindell Spar I, NP  DOK 100 MG capsule TK 1 Capsul PO BID AS NEEDED FOR CONSTIPATION 02/20/17   Lindell Spar I, NP  gabapentin (NEURONTIN) 300 MG capsule Take 1 capsule  (300 mg total) by mouth 3 (three) times daily. For agitation 02/20/17   Lindell Spar I, NP  hydrOXYzine (ATARAX/VISTARIL) 50 MG tablet Take 1 tablet (50 mg total) by mouth every 6 (six) hours as needed for anxiety. Patient taking differently: Take 50 mg by mouth in the morning, at noon, in the evening, and at bedtime. 02/20/17   Lindell Spar I, NP  lithium carbonate (ESKALITH) 450 MG CR tablet Take 2 tablets (900 mg total) by mouth daily. For mood stabilization 02/20/17   Lindell Spar I, NP  LORazepam (ATIVAN) 1 MG tablet Take 1 Tablet  PO TID for anxiety 02/20/17   Lindell Spar I, NP  magnesium oxide (MAG-OX) 400 (241.3 Mg) MG tablet Take 1 tablet (400 mg total) by mouth daily. For constipation 02/21/17   Lindell Spar I, NP  oxyCODONE-acetaminophen (PERCOCET) 5-325 MG tablet Take 1 tablet by mouth every 4 (four) hours as needed. 03/12/21   Diona Browner, DMD    Physical Exam: Vitals:   04/07/21 0100 04/07/21 0115 04/07/21 0130 04/07/21 0347  BP: 114/76 108/73 108/70 108/71  Pulse: 82 83 84 86  Resp:   15 16  Temp:      TempSrc:      SpO2: 98% 97% 97% 98%  Weight:      Height:        Constitutional: NAD, calm  Eyes: PERTLA, lids and conjunctivae normal ENMT: Mucous membranes are moist. Posterior pharynx clear of any exudate or lesions.   Neck: supple, no masses  Respiratory: no wheezing, no crackles. No accessory muscle use.  Cardiovascular: S1 & S2 heard, regular rate and rhythm. No extremity edema.   Abdomen: No distension, soft, suprapubic tenderness, no rebound pain or guarding. Bowel sounds active.  Musculoskeletal: no clubbing / cyanosis. No joint deformity upper and lower extremities.   Skin: induration and tenderness in right groin. Warm, dry, well-perfused. Neurologic: CN 2-12 grossly intact. Moving all extremities. Alert and oriented.  Psychiatric: Calm. Cooperative.    Labs and Imaging on Admission: I have personally reviewed following labs and imaging  studies  CBC: Recent Labs  Lab 04/06/21 1857  WBC 23.5*  NEUTROABS 19.3*  HGB 12.6*  HCT 37.6*  MCV 87.9  PLT 0000000   Basic Metabolic Panel: Recent Labs  Lab 04/06/21 1857  NA 133*  K 4.1  CL 97*  CO2 27  GLUCOSE 130*  BUN 19  CREATININE 0.97  CALCIUM 9.7   GFR: Estimated Creatinine Clearance: 115.4 mL/min (by C-G formula based on SCr of 0.97 mg/dL). Liver Function Tests: No results for input(s): AST, ALT, ALKPHOS, BILITOT, PROT, ALBUMIN in the last 168 hours. No results for input(s): LIPASE, AMYLASE in the last 168 hours. No results for input(s): AMMONIA in the last 168 hours. Coagulation Profile: No results for input(s): INR, PROTIME in the last 168 hours. Cardiac Enzymes: No results for input(s): CKTOTAL, CKMB, CKMBINDEX, TROPONINI in the last 168 hours. BNP (  last 3 results) No results for input(s): PROBNP in the last 8760 hours. HbA1C: No results for input(s): HGBA1C in the last 72 hours. CBG: No results for input(s): GLUCAP in the last 168 hours. Lipid Profile: No results for input(s): CHOL, HDL, LDLCALC, TRIG, CHOLHDL, LDLDIRECT in the last 72 hours. Thyroid Function Tests: No results for input(s): TSH, T4TOTAL, FREET4, T3FREE, THYROIDAB in the last 72 hours. Anemia Panel: No results for input(s): VITAMINB12, FOLATE, FERRITIN, TIBC, IRON, RETICCTPCT in the last 72 hours. Urine analysis:    Component Value Date/Time   COLORURINE YELLOW 04/06/2021 1856   APPEARANCEUR CLOUDY (A) 04/06/2021 1856   APPEARANCEUR Clear 08/22/2013 2058   LABSPEC 1.021 04/06/2021 1856   LABSPEC 1.016 08/22/2013 2058   PHURINE 5.0 04/06/2021 1856   GLUCOSEU NEGATIVE 04/06/2021 1856   GLUCOSEU Negative 08/22/2013 2058   HGBUR SMALL (A) 04/06/2021 1856   BILIRUBINUR NEGATIVE 04/06/2021 1856   BILIRUBINUR Negative 08/22/2013 2058   KETONESUR 5 (A) 04/06/2021 1856   PROTEINUR 100 (A) 04/06/2021 1856   UROBILINOGEN 0.2 01/02/2011 1857   NITRITE NEGATIVE 04/06/2021 1856    LEUKOCYTESUR LARGE (A) 04/06/2021 1856   LEUKOCYTESUR Negative 08/22/2013 2058   Sepsis Labs: @LABRCNTIP (procalcitonin:4,lacticidven:4) ) Recent Results (from the past 240 hour(s))  Resp Panel by RT-PCR (Flu A&B, Covid) Urine, Clean Catch     Status: None   Collection Time: 04/06/21  6:57 PM   Specimen: Urine, Clean Catch; Nasopharyngeal(NP) swabs in vial transport medium  Result Value Ref Range Status   SARS Coronavirus 2 by RT PCR NEGATIVE NEGATIVE Final    Comment: (NOTE) SARS-CoV-2 target nucleic acids are NOT DETECTED.  The SARS-CoV-2 RNA is generally detectable in upper respiratory specimens during the acute phase of infection. The lowest concentration of SARS-CoV-2 viral copies this assay can detect is 138 copies/mL. A negative result does not preclude SARS-Cov-2 infection and should not be used as the sole basis for treatment or other patient management decisions. A negative result may occur with  improper specimen collection/handling, submission of specimen other than nasopharyngeal swab, presence of viral mutation(s) within the areas targeted by this assay, and inadequate number of viral copies(<138 copies/mL). A negative result must be combined with clinical observations, patient history, and epidemiological information. The expected result is Negative.  Fact Sheet for Patients:  EntrepreneurPulse.com.au  Fact Sheet for Healthcare Providers:  IncredibleEmployment.be  This test is no t yet approved or cleared by the [place] FDA and  has been authorized for detection and/or diagnosis of SARS-CoV-2 by FDA under an Emergency Use Authorization (EUA). This EUA will remain  in effect (meaning this test can be used) for the duration of the COVID-19 declaration under Section 564(b)(1) of the Act, 21 U.S.C.section 360bbb-3(b)(1), unless the authorization is terminated  or revoked sooner.       Influenza A by PCR NEGATIVE NEGATIVE  Final   Influenza B by PCR NEGATIVE NEGATIVE Final    Comment: (NOTE) The Xpert Xpress SARS-CoV-2/FLU/RSV plus assay is intended as an aid in the diagnosis of influenza from Nasopharyngeal swab specimens and should not be used as a sole basis for treatment. Nasal washings and aspirates are unacceptable for Xpert Xpress SARS-CoV-2/FLU/RSV testing.  Fact Sheet for Patients: EntrepreneurPulse.com.au  Fact Sheet for Healthcare Providers: IncredibleEmployment.be  This test is not yet approved or cleared by the [place] FDA and has been authorized for detection and/or diagnosis of SARS-CoV-2 by FDA under an Emergency Use Authorization (EUA). This EUA will remain in effect (meaning this  test can be used) for the duration of the COVID-19 declaration under Section 564(b)(1) of the Act, 21 U.S.C. section 360bbb-3(b)(1), unless the authorization is terminated or revoked.  Performed at [facility], [place] [address], [place], [place] [address]   Blood culture (routine x 2)     Status: None (Preliminary result)   Collection Time: 04/06/21  9:00 PM   Specimen: Left Antecubital; Blood  Result Value Ref Range Status   Specimen Description   Final    LEFT ANTECUBITAL BLOOD Performed at [facility], [place] [address] [place], [place] [address]    Special Requests   Final    BOTTLES DRAWN AEROBIC AND ANAEROBIC Blood Culture adequate volume Performed at [place] [address] [place], [place] [address]    Culture PENDING  Incomplete   Report Status PENDING  Incomplete  Blood culture (routine x 2)     Status: None (Preliminary result)   Collection Time: 04/06/21  9:00 PM   Specimen: Right Antecubital; Blood  Result Value Ref Range Status   Specimen Description   Final    RIGHT ANTECUBITAL BLOOD Performed at [facility], [place] [address], [place], [place] [address]    Special Requests   Final     BOTTLES DRAWN AEROBIC AND ANAEROBIC Blood Culture adequate volume Performed at [place] [address], [place],  [address]    Culture PENDING  Incomplete   Report Status PENDING  Incomplete     Radiological Exams on Admission: DG Chest 2 View  Result Date: 04/06/2021 CLINICAL DATA:  Cough, lethargy EXAM: CHEST - 2 VIEW COMPARISON:  05/28/2010 FINDINGS: Frontal and lateral views of the chest demonstrate an unremarkable cardiac silhouette. No acute airspace disease, effusion, or pneumothorax. No acute bony abnormalities. Moderate retained stool within the transverse colon. IMPRESSION: 1. No acute intrathoracic process. Electronically Signed   By: Randa Ngo M.D.   On: 04/06/2021 19:23   CT ABDOMEN PELVIS W CONTRAST  Result Date: 04/07/2021 CLINICAL DATA:  Redness/swelling to lower abdomen and groin, leukocytosis, sepsis, concern for Fournier's gangrene. EXAM: CT ABDOMEN AND PELVIS WITH CONTRAST TECHNIQUE: Multidetector CT imaging of the abdomen and pelvis was performed using the standard protocol following bolus administration of intravenous contrast. RADIATION DOSE REDUCTION: This exam was performed according to the departmental dose-optimization program which includes automated exposure control, adjustment of the mA and/or kV according to patient size and/or use of iterative reconstruction technique. CONTRAST:  130mL OMNIPAQUE IOHEXOL 300 MG/ML  SOLN COMPARISON:  None. FINDINGS: Motion degraded images. Lower chest: Mild dependent atelectasis in the bilateral lower lobes. Hepatobiliary: Liver is within normal limits. Gallbladder is unremarkable. No intrahepatic or extrahepatic ductal dilatation. Pancreas: Within normal limits. Spleen: Within normal limits. Adrenals/Urinary Tract: Adrenal glands are within normal limits. 18 mm left upper pole renal cyst (series 2/image 25). Right kidney is within normal limits. No hydronephrosis. Mildly thick-walled bladder.  Stomach/Bowel: Stomach is within normal limits. No evidence of bowel obstruction. Appendix is not discretely visualized, reportedly surgically absent. Moderate colonic stool burden, suggesting constipation. Vascular/Lymphatic: No evidence of abdominal aortic aneurysm. No suspicious abdominopelvic lymphadenopathy. Reproductive: Heterogeneous enhancement of the right prostate (series 2/image 51), nonspecific. In this setting, correlate for prostatitis. Other: No abdominopelvic ascites. Musculoskeletal: Mild degenerative changes at T12-L1. No soft tissue gas/inflammatory changes in the perineal region to suggest Fournier's gangrene. IMPRESSION: No findings to suggest Fournier's gangrene. Heterogeneous enhancement of the prostate, nonspecific. In this setting, correlate for prostatitis.  Moderate colonic stool burden, suggesting constipation. Mildly thick-walled bladder, raising the possibility of cystitis. Electronically Signed   By: Julian Hy M.D.   On: 04/07/2021 02:01     Assessment/Plan  1. Cellulitis; ?UTI  - Presents with 1-2 days of lethargy and pain in right groin and perineum  - Found to have WBC 23,500, sterile pyuria, and CT without gas or abscess but concerning for possible cystitis and prostatitis  - Blood and urine cultures were collected in ED and broad-spectrum antibiotics started  - Treat with Rocephin for now, follow cultures and clinical course    2. Schizoaffective disorder; intellectual disability  - Calm and cooperative on admission  - Continue home regimen     DVT prophylaxis: Lovenox  Code Status: Full  Level of Care: Level of care: Med-Surg Family Communication: Mother at bedside  Disposition Plan:  Patient is from: Group home  Anticipated d/c is to: Group home  Anticipated d/c date is: 2/2 or 04/09/21  Patient currently: Pending cultures, improvement in SIRS criteria  Consults called: none  Admission status: Observation     Vianne Bulls, MD [facility]  04/07/2021, 4:02 AM

## 2021-04-07 NOTE — Plan of Care (Signed)

## 2021-04-08 DIAGNOSIS — N419 Inflammatory disease of prostate, unspecified: Secondary | ICD-10-CM | POA: Diagnosis not present

## 2021-04-08 DIAGNOSIS — A419 Sepsis, unspecified organism: Secondary | ICD-10-CM | POA: Diagnosis not present

## 2021-04-08 DIAGNOSIS — G9341 Metabolic encephalopathy: Secondary | ICD-10-CM | POA: Diagnosis not present

## 2021-04-08 DIAGNOSIS — L0291 Cutaneous abscess, unspecified: Secondary | ICD-10-CM

## 2021-04-08 DIAGNOSIS — L03818 Cellulitis of other sites: Secondary | ICD-10-CM | POA: Diagnosis not present

## 2021-04-08 LAB — BLOOD CULTURE ID PANEL (REFLEXED) - BCID2

## 2021-04-08 LAB — BASIC METABOLIC PANEL
Anion gap: 5 (ref 5–15)
BUN: 13 mg/dL (ref 6–20)
CO2: 28 mmol/L (ref 22–32)
Calcium: 8.5 mg/dL — ABNORMAL LOW (ref 8.9–10.3)
Chloride: 102 mmol/L (ref 98–111)
Creatinine, Ser: 0.77 mg/dL (ref 0.61–1.24)
GFR, Estimated: >60 mL/min (ref >60)
Glucose, Bld: 78 mg/dL (ref 70–99)
Potassium: 3.5 mmol/L (ref 3.5–5.1)
Sodium: 135 mmol/L (ref 135–145)

## 2021-04-08 LAB — CBC
HCT: 31.4 % — ABNORMAL LOW (ref 39.0–52.0)
Hemoglobin: 10.4 g/dL — ABNORMAL LOW (ref 13.0–17.0)
MCH: 28.8 pg (ref 26.0–34.0)
MCHC: 33.1 g/dL (ref 30.0–36.0)
MCV: 87 fL (ref 80.0–100.0)
Platelets: 160 10*3/uL (ref 150–400)
RBC: 3.61 MIL/uL — ABNORMAL LOW (ref 4.22–5.81)
RDW: 16.4 % — ABNORMAL HIGH (ref 11.5–15.5)
WBC: 12 10*3/uL — ABNORMAL HIGH (ref 4.0–10.5)
nRBC: 0 % (ref 0.0–0.2)

## 2021-04-08 MED ORDER — LEVOFLOXACIN 500 MG PO TABS: 500.0000 mg | ORAL_TABLET | Freq: Every day | ORAL | 0 refills | Status: DC

## 2021-04-08 MED ORDER — LEVOFLOXACIN 750 MG PO TABS: 750.0000 mg | ORAL_TABLET | Freq: Every day | ORAL | 0 refills | Status: AC

## 2021-04-08 MED ORDER — DOXYCYCLINE HYCLATE 100 MG PO TABS: 100.0000 mg | ORAL_TABLET | Freq: Two times a day (BID) | ORAL | 0 refills | Status: AC

## 2021-04-08 MED ORDER — DOXYCYCLINE HYCLATE 100 MG PO TABS
100.0000 mg | ORAL_TABLET | Freq: Two times a day (BID) | ORAL | Status: DC
  Administered 2021-04-08: 100 mg via ORAL
  Filled 2021-04-08: qty 1

## 2021-04-08 MED ORDER — VANCOMYCIN HCL 1500 MG/300ML IV SOLN
1500.0000 mg | Freq: Once | INTRAVENOUS | Status: AC
  Administered 2021-04-08: 1500 mg via INTRAVENOUS
  Filled 2021-04-08: qty 300

## 2021-04-08 MED ORDER — VANCOMYCIN HCL 1500 MG/300ML IV SOLN: 1500.0000 mg | Freq: Two times a day (BID) | INTRAVENOUS | Status: DC

## 2021-04-08 MED ORDER — SACCHAROMYCES BOULARDII 250 MG PO CAPS
250.0000 mg | ORAL_CAPSULE | Freq: Two times a day (BID) | ORAL | 0 refills | Status: AC
Start: 2021-04-08 — End: 2021-05-08

## 2021-04-08 NOTE — TOC Initial Note (Addendum)
Transition of Care [facility]) - Initial/Assessment Note    Patient Details  Name: Jack [name] MRN: [id number] Date of Birth: December 22, 1985  Transition of [facility]) CM/SW Contact:    Leeroy Cha, RN Phone Number: 04/08/2021, 3:30 PM  Clinical Narrative:                 Tct-mother=message left that patient is ready to go to [facility] group home.  Request that she call me back asap. Tcf-mother she can come and transport patient back to his group hpome after 1630 today.  Message chat sent to rh and md. Expected Discharge Plan: Group Home Barriers to Discharge: No Barriers Identified   Patient Goals and CMS Choice   CMS Medicare.gov Compare Post Acute Care list provided to:: Legal Guardian    Expected Discharge Plan and Services Expected Discharge Plan: Group Home   Discharge Planning Services: CM Consult   Living arrangements for the past 2 months: Group Home                                      Prior Living Arrangements/Services Living arrangements for the past 2 months: Group Home Lives with:: Facility Resident   Do you feel safe going back to the place where you live?: Yes               Activities of Daily Living Home Assistive Devices/Equipment: None ADL Screening (condition at time of admission) Patient's cognitive ability adequate to safely complete daily activities?: Yes Is the patient deaf or have difficulty hearing?: No Does the patient have difficulty seeing, even when wearing glasses/contacts?: No Does the patient have difficulty concentrating, remembering, or making decisions?: Yes Patient able to express need for assistance with ADLs?: Yes Does the patient have difficulty dressing or bathing?: No Independently performs ADLs?: Yes (appropriate for developmental age) Does the patient have difficulty walking or climbing stairs?: No Weakness of Legs: None Weakness of Arms/Hands: None  Permission Sought/Granted                  Emotional  Assessment       Orientation: : Oriented to Place, Oriented to Self, Oriented to  Time, Oriented to Situation Alcohol / Substance Use: Not Applicable Psych Involvement: No (comment)  Admission diagnosis:  Cellulitis [L03.90] Sepsis, due to unspecified organism, unspecified whether acute organ dysfunction present [facility]) [A41.9] Patient Active Problem List   Diagnosis Date Noted   Sepsis ([place])    Prostatitis    Abscess    Cellulitis [id number]   Acute metabolic encephalopathy 123XX123   Urinary tract infection without hematuria    Bipolar affective disorder, manic, severe ([place]) 01/29/2017   Agitation 03/09/2015   Bipolar 1 disorder, manic, moderate ([place]) 03/08/2015   Schizoaffective disorder, bipolar type ([place]) 01/10/2012   Intellectual disability 01/10/2012   PCP:  Arthur Holms, NP Pharmacy:   [facility] [phone] - [place], [place] - [address] AT [address] [place] [address] Phone: [phone] Fax: [phone]     Social Determinants of Health (SDOH) Interventions    Readmission Risk Interventions No flowsheet data found.

## 2021-04-08 NOTE — Progress Notes (Signed)
Pharmacy Antibiotic Note  Jack [name] is a 36 y.o. male admitted on 04/06/2021 with cellulitis and patient currently on Ceftriaxone.  BCID + methicillin resistance staph epidermidis and Pharmacy has been consulted for Vancomycin dosing.  Plan: Vancomycin 1500 mg IV Q 12 hrs. Goal AUC 400-550.  Expected AUC: 448.5  SCr used: 0.8 (rounded up from 0.77) Continue Ceftriaxone    Height: 5\' 11"  (180.3 cm) Weight: 77.1 kg (170 lb) IBW/kg (Calculated) : 75.3  Temp (24hrs), Avg:98.2 F (36.8 C), Min:97.6 F (36.4 C), Max:98.9 F (37.2 C)  Recent Labs  Lab 04/06/21 1857 04/06/21 2100 04/07/21 0419 04/08/21 0414  WBC 23.5*  --  16.9* 12.0*  CREATININE 0.97  --  0.70 0.77  LATICACIDVEN  --  1.4  --   --     Estimated Creatinine Clearance: 139.9 mL/min (by C-G formula based on SCr of 0.77 mg/dL).    Allergies  Allergen Reactions   Iodinated Contrast Media Other (See Comments)    Reaction unknown--per [facility].     Latex Rash   Shellfish-Derived Products Rash    Antimicrobials this admission: 1/31 Clindamycin x 1 1/31 Cefepime x 1 1/31 Vanc x 1; restarted 2/2 >> 2/1 Ceftriaxone >>  Dose adjustments this admission:    Microbiology results: 1/31 BCx: Meth resistance Staph Epi 1/31 UCx:       Thank you for allowing pharmacy to be a part of this patients care.  Everette Rank, PharmD 04/08/2021 4:59 AM

## 2021-04-08 NOTE — Consult Note (Addendum)
[facility] [facility]    Date of Admission:  04/06/2021     Reason for Consult: staph epi bacteremia, groin abscess    Referring Provider: Antonieta Pert    Abx: 2/02-c doxy 1/31-c ceftriaxone       1/31-2/02 vanc   Assessment: Groin abscess Prostatitis vs prostatic abscess Developmental delay/schizophrenia  36 yo male admitted from group home for weeks to a few months progressive right groin pain/swelling, along with imaging concern prostatitis  Vague history. Presumed at risk for std based on age. Consider LGV as well for both the groin finding (scarring/superficial cellulitic change -- no frank abscess on ct scan) the and rectal pain. Query ct finding of prostate and patient's report rectal pain with having bowel movement represent prostatic abscess or just prostatitis; his sx seems to have been of several weeks at least  Suspect the CoNS on bcx is contaminant -- no risk factor for harboring CoNS vascular complication  Hiv screen negative  Plan: Urine gc/chlamydia pcr; rpr/fta screen Continue ceftriaxone; stop vancomycin  Add empiric doxy for concern of anorectal LGV and groin lesion potentially this as well Would also discuss with urology about ct finding and clinical finding to evaluate for prostatic abscess Oral abx transition from ceftriaxone to be determined once urology evaluate. Plan on 3 weeks doxycycline Discussed with primary team     ------------------------------------------------ Principal Problem:   Acute metabolic encephalopathy Active Problems:   Schizoaffective disorder, bipolar type ([place])   Intellectual disability   Cellulitis   Urinary tract infection without hematuria    HPI: Jack [name] is a 36 y.o. male schizoaffective/developmental delay admitted from group home with 2 days lethargy in setting of chronic right groin pain/swellling  Hx via signout and staffs and patient. Patient's history is limited due to his  cognition  He reports weeks to few months right groin pain/swelling. He doesn't see any pus. Denies dysuria. Endorse rectal pain when having bowel movement  Non-committal on sexual history  Staff at nursing home notices 2 days lethargy and decreased intake prior to admission  Afebrile here Wbc 23 on admission Started on bsABX Initial bcx in ED at the same time showed staph epi Urine cx with ecoli Patient's wbc has improved since abx  ID called initially for interpretation of bcx. No cardiac device/vascular catheter  Other hx patient has some dental work on 1/06    Fam hx: No immunosuppression  Social History   Tobacco Use   Smoking status: Never    Passive exposure: Never   Smokeless tobacco: Never  Vaping Use   Vaping Use: Never used  Substance Use Topics   Alcohol use: No   Drug use: No    Allergies  Allergen Reactions   Iodinated Contrast Media Other (See Comments)    Reaction unknown--per [facility].     Latex Rash   Shellfish-Derived Products Rash    Review of Systems: ROS All Other ROS was negative, except mentioned above   Past Medical History:  Diagnosis Date   Bipolar 1 disorder ([place])    Intellectual disability    Mental retardation    Schizophrenia, schizo-affective (HCC)    Seizures ([place])        Scheduled Meds:  cloZAPine  100 mg Oral 2 times per day   And   cloZAPine  350 mg Oral QHS   divalproex  2,000 mg Oral Daily   docusate sodium  100 mg Oral BID  doxycycline  100 mg Oral Q12H   enoxaparin (LOVENOX) injection  40 mg Subcutaneous Q24H   gabapentin  300 mg Oral TID   lithium carbonate  900 mg Oral Daily   magnesium oxide  400 mg Oral Daily   Continuous Infusions:  cefTRIAXone (ROCEPHIN)  IV 2 g (04/08/21 0510)   PRN Meds:.acetaminophen **OR** acetaminophen, bisacodyl, hydrOXYzine, polyethylene glycol   OBJECTIVE: Blood pressure 104/69, pulse 87, temperature 98.9 F (37.2 C), resp. rate 16, height 5\' 11"  (1.803 m),  weight 77.1 kg, SpO2 99 %.  Physical Exam  General/constitutional: no distress, pleasant HEENT: Normocephalic, PER, Conj Clear, EOMI, Oropharynx clear Neck supple CV: rrr no mrg Lungs: clear to auscultation, normal respiratory effort Abd: Soft, Nontender Ext: no edema Skin: No Rash Neuro: nonfocal MSK: no peripheral joint swelling/tenderness/warmth; back spines nontender  GU: linear sinus tract right groin below inguinal ligament tender to palpation -- reported erythema had much improved; no obvious groove sign      Lab Results Lab Results  Component Value Date   WBC 12.0 (H) 04/08/2021   HGB 10.4 (L) 04/08/2021   HCT 31.4 (L) 04/08/2021   MCV 87.0 04/08/2021   PLT 160 04/08/2021    Lab Results  Component Value Date   CREATININE 0.77 04/08/2021   BUN 13 04/08/2021   NA 135 04/08/2021   K 3.5 04/08/2021   CL 102 04/08/2021   CO2 28 04/08/2021    Lab Results  Component Value Date   ALT 16 08/14/2020   AST 22 08/14/2020   ALKPHOS 76 08/14/2020   BILITOT 0.7 08/14/2020      Microbiology: Recent Results (from the past 240 hour(s))  Urine Culture     Status: Abnormal (Preliminary result)   Collection Time: 04/06/21  6:56 PM   Specimen: Urine, Clean Catch  Result Value Ref Range Status   Specimen Description   Final    URINE, CLEAN CATCH Performed at [facility], [place] [address], [place], [place] [address]    Special Requests   Final    NONE Performed at [facility], [place] [address] [place], [place] [address]    Culture (A)  Final    >=100,000 COLONIES/mL ESCHERICHIA COLI SUSCEPTIBILITIES TO FOLLOW Performed at [facility], [place] [address], [place], [place] [address]    Report Status PENDING  Incomplete  Resp Panel by RT-PCR (Flu A&B, Covid) Urine, Clean Catch     Status: None   Collection Time: 04/06/21  6:57 PM   Specimen: Urine, Clean Catch; Nasopharyngeal(NP) swabs in vial transport medium  Result Value  Ref Range Status   SARS Coronavirus 2 by RT PCR NEGATIVE NEGATIVE Final    Comment: (NOTE) SARS-CoV-2 target nucleic acids are NOT DETECTED.  The SARS-CoV-2 RNA is generally detectable in upper respiratory specimens during the acute phase of infection. The lowest concentration of SARS-CoV-2 viral copies this assay can detect is 138 copies/mL. A negative result does not preclude SARS-Cov-2 infection and should not be used as the sole basis for treatment or other patient management decisions. A negative result may occur with  improper specimen collection/handling, submission of specimen other than nasopharyngeal swab, presence of viral mutation(s) within the areas targeted by this assay, and inadequate number of viral copies(<138 copies/mL). A negative result must be combined with clinical observations, patient history, and epidemiological information. The expected result is Negative.  Fact Sheet for Patients:  EntrepreneurPulse.com.au  Fact Sheet for Healthcare Providers:  IncredibleEmployment.be  This test  is no t yet approved or cleared by the [place] and  has been authorized for detection and/or diagnosis of SARS-CoV-2 by FDA under an Emergency Use Authorization (EUA). This EUA will remain  in effect (meaning this test can be used) for the duration of the COVID-19 declaration under Section 564(b)(1) of the Act, 21 U.S.C.section 360bbb-3(b)(1), unless the authorization is terminated  or revoked sooner.       Influenza A by PCR NEGATIVE NEGATIVE Final   Influenza B by PCR NEGATIVE NEGATIVE Final    Comment: (NOTE) The Xpert Xpress SARS-CoV-2/FLU/RSV plus assay is intended as an aid in the diagnosis of influenza from Nasopharyngeal swab specimens and should not be used as a sole basis for treatment. Nasal washings and aspirates are unacceptable for Xpert Xpress SARS-CoV-2/FLU/RSV testing.  Fact Sheet for  Patients: BloggerCourse.com  Fact Sheet for Healthcare Providers: SeriousBroker.it  This test is not yet approved or cleared by the [place] FDA and has been authorized for detection and/or diagnosis of SARS-CoV-2 by FDA under an Emergency Use Authorization (EUA). This EUA will remain in effect (meaning this test can be used) for the duration of the COVID-19 declaration under Section 564(b)(1) of the Act, 21 U.S.C. section 360bbb-3(b)(1), unless the authorization is terminated or revoked.  Performed at [facility], [address], [place], [place] [address]   Blood culture (routine x 2)     Status: None (Preliminary result)   Collection Time: 04/06/21  9:00 PM   Specimen: Left Antecubital; Blood  Result Value Ref Range Status   Specimen Description   Final    LEFT ANTECUBITAL BLOOD Performed at [facility], [address] [place], [place] [address]    Special Requests   Final    BOTTLES DRAWN AEROBIC AND ANAEROBIC Blood Culture adequate volume Performed at [facility], [address], [place], [place] [address]    Culture  Setup Time   Final    GRAM POSITIVE COCCI AEROBIC BOTTLE ONLY CRITICAL RESULT CALLED TO, READ BACK BY AND VERIFIED WITH: L POINDEXTER,PHARMD@0410  04/08/21 MK    Culture   Final    GRAM POSITIVE COCCI TOO YOUNG TO READ Performed at [facility], [address] [place], [place] [address]    Report Status PENDING  Incomplete  Blood culture (routine x 2)     Status: None (Preliminary result)   Collection Time: 04/06/21  9:00 PM   Specimen: Right Antecubital; Blood  Result Value Ref Range Status   Specimen Description   Final    RIGHT ANTECUBITAL BLOOD Performed at [facility], [address], [place], [place] [address]    Special Requests   Final    BOTTLES DRAWN AEROBIC AND ANAEROBIC Blood Culture adequate volume Performed at [facility], [address], [place], [place] [address]    Culture  Setup Time   Final    GRAM POSITIVE COCCI ANAEROBIC BOTTLE ONLY CRITICAL VALUE NOTED.  VALUE IS CONSISTENT WITH PREVIOUSLY REPORTED AND CALLED VALUE.    Culture   Final    GRAM POSITIVE COCCI TOO YOUNG TO READ Performed at [facility], [address] [place], [place] [address]    Report Status PENDING  Incomplete  Blood Culture ID Panel (Reflexed)     Status: Abnormal   Collection Time: 04/06/21  9:00 PM  Result Value Ref Range Status   Enterococcus faecalis NOT DETECTED NOT DETECTED Final  Enterococcus Faecium NOT DETECTED NOT DETECTED Final   Listeria monocytogenes NOT DETECTED NOT DETECTED Final   Staphylococcus species DETECTED (A) NOT DETECTED Final    Comment: CRITICAL RESULT CALLED TO, READ BACK BY AND VERIFIED WITH: L POINDEXTER,PHARMD@0410  04/08/21 [place]    Staphylococcus aureus (BCID) NOT DETECTED NOT DETECTED Final   Staphylococcus epidermidis DETECTED (A) NOT DETECTED Final    Comment: Methicillin (oxacillin) resistant coagulase negative staphylococcus. Possible blood culture contaminant (unless isolated from more than one blood culture draw or clinical case suggests pathogenicity). No antibiotic treatment is indicated for blood  culture contaminants. CRITICAL RESULT CALLED TO, READ BACK BY AND VERIFIED WITH: L POINDEXTER,PHARMD@0410  04/08/21 [place]    Staphylococcus lugdunensis NOT DETECTED NOT DETECTED Final   Streptococcus species NOT DETECTED NOT DETECTED Final   Streptococcus agalactiae NOT DETECTED NOT DETECTED Final   Streptococcus pneumoniae NOT DETECTED NOT DETECTED Final   Streptococcus pyogenes NOT DETECTED NOT DETECTED Final   A.calcoaceticus-baumannii NOT DETECTED NOT DETECTED Final   Bacteroides fragilis NOT DETECTED NOT DETECTED Final   Enterobacterales NOT DETECTED NOT DETECTED Final   Enterobacter cloacae complex NOT DETECTED NOT DETECTED Final   Escherichia coli NOT  DETECTED NOT DETECTED Final   Klebsiella aerogenes NOT DETECTED NOT DETECTED Final   Klebsiella oxytoca NOT DETECTED NOT DETECTED Final   Klebsiella pneumoniae NOT DETECTED NOT DETECTED Final   Proteus species NOT DETECTED NOT DETECTED Final   Salmonella species NOT DETECTED NOT DETECTED Final   Serratia marcescens NOT DETECTED NOT DETECTED Final   Haemophilus influenzae NOT DETECTED NOT DETECTED Final   Neisseria meningitidis NOT DETECTED NOT DETECTED Final   Pseudomonas aeruginosa NOT DETECTED NOT DETECTED Final   Stenotrophomonas maltophilia NOT DETECTED NOT DETECTED Final   Candida albicans NOT DETECTED NOT DETECTED Final   Candida auris NOT DETECTED NOT DETECTED Final   Candida glabrata NOT DETECTED NOT DETECTED Final   Candida krusei NOT DETECTED NOT DETECTED Final   Candida parapsilosis NOT DETECTED NOT DETECTED Final   Candida tropicalis NOT DETECTED NOT DETECTED Final   Cryptococcus neoformans/gattii NOT DETECTED NOT DETECTED Final   Methicillin resistance mecA/C DETECTED (A) NOT DETECTED Final    Comment: CRITICAL RESULT CALLED TO, READ BACK BY AND VERIFIED WITH: Performed at [facility], [address], [place], [place] [address]      Serology:    Imaging: If present, new imagings (plain films, ct scans, and mri) have been personally visualized and interpreted; radiology reports have been reviewed. Decision making incorporated into the Impression / Recommendations.  2/01 abd pelv ct with contrast No findings to suggest Fournier's gangrene.   Heterogeneous enhancement of the prostate, nonspecific. In this setting, correlate for prostatitis.   Moderate colonic stool burden, suggesting constipation.   Mildly thick-walled bladder, raising the possibility of cystitis.   Jabier Mutton, [place] for Infectious [place] [phone] pager    04/08/2021, 1:07 PM

## 2021-04-08 NOTE — Progress Notes (Signed)
PHARMACY - PHYSICIAN COMMUNICATION CRITICAL VALUE ALERT - BLOOD CULTURE IDENTIFICATION (BCID)  Jack [name] is an 36 y.o. male who presented to [facility] on 04/06/2021 with cellulitis.  Assessment: BCID = Staph Epidermidis w/ methicillin resistance growing in 2 out of 4 bottles.  Name of physician (or Provider) Contacted: Chinita Greenland, FNP  Current antibiotics: Ceftriaxone  Changes to prescribed antibiotics recommended:  Continue Ceftriaxone Add Vancomycin  Results for orders placed or performed during the hospital encounter of 04/06/21  Blood Culture ID Panel (Reflexed) (Collected: 04/06/2021  9:00 PM)  Result Value Ref Range   Enterococcus faecalis NOT DETECTED NOT DETECTED   Enterococcus Faecium NOT DETECTED NOT DETECTED   Listeria monocytogenes NOT DETECTED NOT DETECTED   Staphylococcus species DETECTED (A) NOT DETECTED   Staphylococcus aureus (BCID) NOT DETECTED NOT DETECTED   Staphylococcus epidermidis DETECTED (A) NOT DETECTED   Staphylococcus lugdunensis NOT DETECTED NOT DETECTED   Streptococcus species NOT DETECTED NOT DETECTED   Streptococcus agalactiae NOT DETECTED NOT DETECTED   Streptococcus pneumoniae NOT DETECTED NOT DETECTED   Streptococcus pyogenes NOT DETECTED NOT DETECTED   A.calcoaceticus-baumannii NOT DETECTED NOT DETECTED   Bacteroides fragilis NOT DETECTED NOT DETECTED   Enterobacterales NOT DETECTED NOT DETECTED   Enterobacter cloacae complex NOT DETECTED NOT DETECTED   Escherichia coli NOT DETECTED NOT DETECTED   Klebsiella aerogenes NOT DETECTED NOT DETECTED   Klebsiella oxytoca NOT DETECTED NOT DETECTED   Klebsiella pneumoniae NOT DETECTED NOT DETECTED   Proteus species NOT DETECTED NOT DETECTED   Salmonella species NOT DETECTED NOT DETECTED   Serratia marcescens NOT DETECTED NOT DETECTED   Haemophilus influenzae NOT DETECTED NOT DETECTED   Neisseria meningitidis NOT DETECTED NOT DETECTED   Pseudomonas aeruginosa NOT DETECTED NOT DETECTED    Stenotrophomonas maltophilia NOT DETECTED NOT DETECTED   Candida albicans NOT DETECTED NOT DETECTED   Candida auris NOT DETECTED NOT DETECTED   Candida glabrata NOT DETECTED NOT DETECTED   Candida krusei NOT DETECTED NOT DETECTED   Candida parapsilosis NOT DETECTED NOT DETECTED   Candida tropicalis NOT DETECTED NOT DETECTED   Cryptococcus neoformans/gattii NOT DETECTED NOT DETECTED   Methicillin resistance mecA/C DETECTED (A) NOT DETECTED    Draylen Lobue, Joselyn Glassman, PharmD 04/08/2021  4:57 AM

## 2021-04-08 NOTE — Progress Notes (Signed)
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 Also would empirically tx lgv   3 weeks doxy 4 weeks levo  No need for outpatient id follow up Discussed with primary team

## 2021-04-08 NOTE — Evaluation (Signed)
Occupational Therapy Evaluation Patient Details Name: Jack [name] MRN: [id number] DOB: 03-05-1986 Today's Date: 04/08/2021   History of Present Illness 36 y.o. male admitted 04/06/21. Pt with medical history significant for schizoaffective disorder, intellectual disability, dental restorations and extractions on March 12, 2021, and recurrent boils in the right groin who presents the emergency department with 2 days of lethargy and right groin pain. Dx of cellulitis and UTI.   Clinical Impression   Pt admitted with the above diagnoses. At baseline, pt lives in a group home, he reports he is able to bath/dress himself with no physical assist. Pt completed functional transfers and mobility in the room at mod I level (reduced speed, mild instability). Pt appears to be at/close to baseline with basic ADLs. No further acute OT needs indicated. OT signing off.     Recommendations for follow up therapy are one component of a multi-disciplinary discharge planning process, led by the attending physician.  Recommendations may be updated based on patient status, additional functional criteria and insurance authorization.   Follow Up Recommendations  No OT follow up    Assistance Recommended at Discharge PRN  Patient can return home with the following      Functional Status Assessment  Patient has not had a recent decline in their functional status  Equipment Recommendations  None recommended by OT    Recommendations for Other Services       Precautions / Restrictions Restrictions Weight Bearing Restrictions: No      Mobility Bed Mobility Overal bed mobility: Independent                  Transfers Overall transfer level: Modified independent Equipment used: None               General transfer comment: mild unsteadiness on initital stand.      Balance Overall balance assessment: Mild deficits observed, not formally tested                                          ADL either performed or assessed with clinical judgement   ADL Overall ADL's : Modified independent                                       General ADL Comments: appears to be at/close to baseline with basic ADLs. Walked in the room navigating turns and obstacles, a bit guarded/slow gait.     Vision         Perception     Praxis      Pertinent Vitals/Pain Pain Assessment Pain Assessment: Faces Faces Pain Scale: Hurts a little bit Pain Location: unspecified Pain Intervention(s): Monitored during session     Hand Dominance     Extremity/Trunk Assessment Upper Extremity Assessment Upper Extremity Assessment: Overall WFL for tasks assessed   Lower Extremity Assessment Lower Extremity Assessment: Defer to PT evaluation       Communication Communication Communication: No difficulties   Cognition Arousal/Alertness: Awake/alert Behavior During Therapy: Flat affect, Anxious Overall Cognitive Status: History of cognitive impairments - at baseline                                       General Comments  Exercises     Shoulder Instructions      Home Living Family/patient expects to be discharged to:: Group home                                 Additional Comments: has a roommate. no assist with basic ADLs. Reports some stairs at the group home.      Prior Functioning/Environment Prior Level of Function : Independent/Modified Independent                        OT Problem List:        OT Treatment/Interventions:      OT Goals(Current goals can be found in the care plan section) Acute Rehab OT Goals Patient Stated Goal: not stated  OT Frequency:      Co-evaluation              AM-PAC OT "6 Clicks" Daily Activity     Outcome Measure Help from another person eating meals?: None Help from another person taking care of personal grooming?: None Help from another person toileting,  which includes using toliet, bedpan, or urinal?: None Help from another person bathing (including washing, rinsing, drying)?: None Help from another person to put on and taking off regular upper body clothing?: None Help from another person to put on and taking off regular lower body clothing?: None 6 Click Score: 24   End of Session    Activity Tolerance: Patient tolerated treatment well;Patient limited by fatigue Patient left: in bed;with call bell/phone within reach  OT Visit Diagnosis: Muscle weakness (generalized) (M62.81)                Time: 7169-6789 OT Time Calculation (min): 17 min Charges:  OT General Charges $OT Visit: 1 Visit OT Evaluation $OT Eval Low Complexity: 1 Low  Raynald Kemp, OT Acute Rehabilitation Services Office: [phone]   [name] 04/08/2021, 10:25 AM

## 2021-04-08 NOTE — Consult Note (Signed)
Urology Consult   Physician requesting consult: Dr Jonathon Bellows  Reason for consult: CT findings consistent with possible prostatitis versus abscess  History of Present Illness: Jack [name] is a 36 y.o. male with significant schizoaffective disorder and intellectual disability resides at a group home.  Recently had tooth extraction was seen in the emergency room tachycardic with evidence of cellulitis in the inguinal area and elevated white count.  During ER evaluation had CT scan suggesting of bladder wall thickening and heterogenous enhancement suggestive of possible prostatitis versus abscess.  Patient states he is not having any significant urinary symptomology leading up to ER evaluation.  Since admission urine culture has grown out E. coli and white blood cell count has been going down on successive days from 25 now down to 12,00 Patient feeling some better.  He continues to have some rectal pain with bowel movement.  Urology asked to evaluate for possible prostatitis versus abscess.  He denies a history of voiding or storage urinary symptoms, hematuria, UTIs, STDs, urolithiasis, GU malignancy/trauma/surgery.  Past Medical History:  Diagnosis Date   Bipolar 1 disorder (HCC)    Intellectual disability    Mental retardation    Schizophrenia, schizo-affective (HCC)    Seizures (HCC)     Past Surgical History:  Procedure Laterality Date   APPENDECTOMY     TOOTH EXTRACTION N/A 03/12/2021   Procedure: DENTAL RESTORATION/EXTRACTIONS;  Surgeon: Ocie Doyne, DMD;  Location: [facility] OR;  Service: Oral Surgery;  Laterality: N/A;    Current Hospital Medications:  Home Meds:  No current facility-administered medications on file prior to encounter.   Current Outpatient Medications on File Prior to Encounter  Medication Sig Dispense Refill   cloZAPine (CLOZARIL) 100 MG tablet Take 1 tablet (100 mg total) by mouth 2 (two) times daily. For mood control (Patient taking differently: Take 100-350 mg by mouth  See admin instructions. Take 100 mg in the morning and afternoon and 350 mg at bedtime) 60 tablet 0   divalproex (DEPAKOTE) 500 MG DR tablet Take 4 tablets (2,000 mg total) by mouth daily. For mood stabilization/seizures 120 tablet 0   gabapentin (NEURONTIN) 300 MG capsule Take 1 capsule (300 mg total) by mouth 3 (three) times daily. For agitation 90 capsule 0   hydrOXYzine (VISTARIL) 50 MG capsule Take 50 mg by mouth 4 (four) times daily.     lithium carbonate (ESKALITH) 450 MG CR tablet Take 2 tablets (900 mg total) by mouth daily. For mood stabilization 60 tablet 0   magnesium oxide (MAG-OX) 400 (241.3 Mg) MG tablet Take 1 tablet (400 mg total) by mouth daily. For constipation 30 tablet 0   hydrOXYzine (ATARAX/VISTARIL) 50 MG tablet Take 1 tablet (50 mg total) by mouth every 6 (six) hours as needed for anxiety. (Patient not taking: Reported on 04/07/2021) 60 tablet 0   LORazepam (ATIVAN) 1 MG tablet Take 1 Tablet  PO TID for anxiety (Patient not taking: Reported on 04/07/2021) 10 tablet 0     Scheduled Meds:  cloZAPine  100 mg Oral 2 times per day   And   cloZAPine  350 mg Oral QHS   divalproex  2,000 mg Oral Daily   docusate sodium  100 mg Oral BID   doxycycline  100 mg Oral Q12H   enoxaparin (LOVENOX) injection  40 mg Subcutaneous Q24H   gabapentin  300 mg Oral TID   lithium carbonate  900 mg Oral Daily   magnesium oxide  400 mg Oral Daily   Continuous Infusions:  cefTRIAXone (ROCEPHIN)  IV 2 g (04/08/21 0510)   PRN Meds:.acetaminophen **OR** acetaminophen, bisacodyl, hydrOXYzine, polyethylene glycol  Allergies:  Allergies  Allergen Reactions   Iodinated Contrast Media Other (See Comments)    Reaction unknown--per [facility].     Latex Rash   Shellfish-Derived Products Rash    History reviewed. No pertinent family history.  Social History:  reports that he has never smoked. He has never been exposed to tobacco smoke. He has never used smokeless tobacco. He reports that he  does not drink alcohol and does not use drugs.  ROS: A complete review of systems was performed.  All systems are negative except for pertinent findings as noted.  Physical Exam:  Vital signs in last 24 hours: Temp:  [97.7 F (36.5 C)-98.9 F (37.2 C)] 97.8 F (36.6 C) (02/02 1323) Pulse Rate:  [87-96] 90 (02/02 1323) Resp:  [16-19] 16 (02/02 1323) BP: (96-122)/(56-87) 101/56 (02/02 1323) SpO2:  [97 %-100 %] 100 % (02/02 1323) Constitutional:  Alert and oriented, No acute distress Rectal: Patient had some tenderness just from the exam but prostate is smooth with no significant tenderness or fluctuance noted. Laboratory Data:  Recent Labs    04/06/21 1857 04/07/21 0419 04/08/21 0414  WBC 23.5* 16.9* 12.0*  HGB 12.6* 10.8* 10.4*  HCT 37.6* 32.7* 31.4*  PLT 179 161 160    Recent Labs    04/06/21 1857 04/07/21 0419 04/08/21 0414  NA 133* 137 135  K 4.1 4.6 3.5  CL 97* 107 102  GLUCOSE 130* 127* 78  BUN 19 15 13   CALCIUM 9.7 9.2 8.5*  CREATININE 0.97 0.70 0.77     Results for orders placed or performed during the hospital encounter of 04/06/21 (from the past 24 hour(s))  Basic metabolic panel     Status: Abnormal   Collection Time: 04/08/21  4:14 AM  Result Value Ref Range   Sodium 135 135 - 145 mmol/L   Potassium 3.5 3.5 - 5.1 mmol/L   Chloride 102 98 - 111 mmol/L   CO2 28 22 - 32 mmol/L   Glucose, Bld 78 70 - 99 mg/dL   BUN 13 6 - 20 mg/dL   Creatinine, Ser 0.77 0.61 - 1.24 mg/dL   Calcium 8.5 (L) 8.9 - 10.3 mg/dL   GFR, Estimated >60 >60 mL/min   Anion gap 5 5 - 15  CBC     Status: Abnormal   Collection Time: 04/08/21  4:14 AM  Result Value Ref Range   WBC 12.0 (H) 4.0 - 10.5 K/uL   RBC 3.61 (L) 4.22 - 5.81 MIL/uL   Hemoglobin 10.4 (L) 13.0 - 17.0 g/dL   HCT 31.4 (L) 39.0 - 52.0 %   MCV 87.0 80.0 - 100.0 fL   MCH 28.8 26.0 - 34.0 pg   MCHC 33.1 30.0 - 36.0 g/dL   RDW 16.4 (H) 11.5 - 15.5 %   Platelets 160 150 - 400 K/uL   nRBC 0.0 0.0 - 0.2 %    Recent Results (from the past 240 hour(s))  Urine Culture     Status: Abnormal (Preliminary result)   Collection Time: 04/06/21  6:56 PM   Specimen: Urine, Clean Catch  Result Value Ref Range Status   Specimen Description   Final    URINE, CLEAN CATCH Performed at [facility], [place] [address], [place], [place] [address]    Special Requests   Final    NONE Performed at [facility], [address] [place], [place] [address]    Culture (A)  Final    >=100,000 COLONIES/mL ESCHERICHIA COLI SUSCEPTIBILITIES TO FOLLOW Performed at [facility], [place] [address], [place], [place] [address]    Report Status PENDING  Incomplete  Resp Panel by RT-PCR (Flu A&B, Covid) Urine, Clean Catch     Status: None   Collection Time: 04/06/21  6:57 PM   Specimen: Urine, Clean Catch; Nasopharyngeal(NP) swabs in vial transport medium  Result Value Ref Range Status   SARS Coronavirus 2 by RT PCR NEGATIVE NEGATIVE Final    Comment: (NOTE) SARS-CoV-2 target nucleic acids are NOT DETECTED.  The SARS-CoV-2 RNA is generally detectable in upper respiratory specimens during the acute phase of infection. The lowest concentration of SARS-CoV-2 viral copies this assay can detect is 138 copies/mL. A negative result does not preclude SARS-Cov-2 infection and should not be used as the sole basis for treatment or other patient management decisions. A negative result may occur with  improper specimen collection/handling, submission of specimen other than nasopharyngeal swab, presence of viral mutation(s) within the areas targeted by this assay, and inadequate number of viral copies(<138 copies/mL). A negative result must be combined with clinical observations, patient history, and epidemiological information. The expected result is Negative.  Fact Sheet for Patients:  EntrepreneurPulse.com.au  Fact Sheet for Healthcare Providers:   IncredibleEmployment.be  This test is no t yet approved or cleared by the [place] FDA and  has been authorized for detection and/or diagnosis of SARS-CoV-2 by FDA under an Emergency Use Authorization (EUA). This EUA will remain  in effect (meaning this test can be used) for the duration of the COVID-19 declaration under Section 564(b)(1) of the Act, 21 U.S.C.section 360bbb-3(b)(1), unless the authorization is terminated  or revoked sooner.       Influenza A by PCR NEGATIVE NEGATIVE Final   Influenza B by PCR NEGATIVE NEGATIVE Final    Comment: (NOTE) The Xpert Xpress SARS-CoV-2/FLU/RSV plus assay is intended as an aid in the diagnosis of influenza from Nasopharyngeal swab specimens and should not be used as a sole basis for treatment. Nasal washings and aspirates are unacceptable for Xpert Xpress SARS-CoV-2/FLU/RSV testing.  Fact Sheet for Patients: EntrepreneurPulse.com.au  Fact Sheet for Healthcare Providers: IncredibleEmployment.be  This test is not yet approved or cleared by the [place] FDA and has been authorized for detection and/or diagnosis of SARS-CoV-2 by FDA under an Emergency Use Authorization (EUA). This EUA will remain in effect (meaning this test can be used) for the duration of the COVID-19 declaration under Section 564(b)(1) of the Act, 21 U.S.C. section 360bbb-3(b)(1), unless the authorization is terminated or revoked.  Performed at [facility], [place] [address], [place], [place] [address]   Blood culture (routine x 2)     Status: None (Preliminary result)   Collection Time: 04/06/21  9:00 PM   Specimen: Left Antecubital; Blood  Result Value Ref Range Status   Specimen Description   Final    LEFT ANTECUBITAL BLOOD Performed at [facility], [place] [address] [place], [place] [address]    Special Requests   Final    BOTTLES DRAWN AEROBIC AND ANAEROBIC Blood Culture  adequate volume Performed at [place] [address], [place],  [address]    Culture  Setup Time   Final    GRAM POSITIVE COCCI AEROBIC BOTTLE ONLY CRITICAL RESULT CALLED TO, READ BACK BY AND VERIFIED WITH: L POINDEXTER,[email or web address]@[id number]  04/08/21 [place]  Culture   Final    GRAM POSITIVE COCCI TOO YOUNG TO READ Performed at [facility], [place] [address] [place], [place] [address]    Report Status PENDING  Incomplete  Blood culture (routine x 2)     Status: None (Preliminary result)   Collection Time: 04/06/21  9:00 PM   Specimen: Right Antecubital; Blood  Result Value Ref Range Status   Specimen Description   Final    RIGHT ANTECUBITAL BLOOD Performed at [facility], [place] [address], [place], [place] [address]    Special Requests   Final    BOTTLES DRAWN AEROBIC AND ANAEROBIC Blood Culture adequate volume Performed at [place] [address], [place], [place] [address]    Culture  Setup Time   Final    GRAM POSITIVE COCCI ANAEROBIC BOTTLE ONLY CRITICAL VALUE NOTED.  VALUE IS CONSISTENT WITH PREVIOUSLY REPORTED AND CALLED VALUE.    Culture   Final    GRAM POSITIVE COCCI TOO YOUNG TO READ Performed at [facility], [place] [address] [place], [place] [address]    Report Status PENDING  Incomplete  Blood Culture ID Panel (Reflexed)     Status: Abnormal   Collection Time: 04/06/21  9:00 PM  Result Value Ref Range Status   Enterococcus faecalis NOT DETECTED NOT DETECTED Final   Enterococcus Faecium NOT DETECTED NOT DETECTED Final   Listeria monocytogenes NOT DETECTED NOT DETECTED Final   Staphylococcus species DETECTED (A) NOT DETECTED Final    Comment: CRITICAL RESULT CALLED TO, READ BACK BY AND VERIFIED WITH: L POINDEXTER,[email or web address]@[email or web address]  04/08/21 [place]    Staphylococcus aureus (BCID) NOT DETECTED NOT DETECTED Final   Staphylococcus epidermidis DETECTED (A) NOT DETECTED Final    Comment: Methicillin (oxacillin)  resistant coagulase negative staphylococcus. Possible blood culture contaminant (unless isolated from more than one blood culture draw or clinical case suggests pathogenicity). No antibiotic treatment is indicated for blood  culture contaminants. CRITICAL RESULT CALLED TO, READ BACK BY AND VERIFIED WITH: L POINDEXTER,[email or web address]@[email or web address]  04/08/21 [place]    Staphylococcus lugdunensis NOT DETECTED NOT DETECTED Final   Streptococcus species NOT DETECTED NOT DETECTED Final   Streptococcus agalactiae NOT DETECTED NOT DETECTED Final   Streptococcus pneumoniae NOT DETECTED NOT DETECTED Final   Streptococcus pyogenes NOT DETECTED NOT DETECTED Final   A.calcoaceticus-baumannii NOT DETECTED NOT DETECTED Final   Bacteroides fragilis NOT DETECTED NOT DETECTED Final   Enterobacterales NOT DETECTED NOT DETECTED Final   Enterobacter cloacae complex NOT DETECTED NOT DETECTED Final   Escherichia coli NOT DETECTED NOT DETECTED Final   Klebsiella aerogenes NOT DETECTED NOT DETECTED Final   Klebsiella oxytoca NOT DETECTED NOT DETECTED Final   Klebsiella pneumoniae NOT DETECTED NOT DETECTED Final   Proteus species NOT DETECTED NOT DETECTED Final   Salmonella species NOT DETECTED NOT DETECTED Final   Serratia marcescens NOT DETECTED NOT DETECTED Final   Haemophilus influenzae NOT DETECTED NOT DETECTED Final   Neisseria meningitidis NOT DETECTED NOT DETECTED Final   Pseudomonas aeruginosa NOT DETECTED NOT DETECTED Final   Stenotrophomonas maltophilia NOT DETECTED NOT DETECTED Final   Candida albicans NOT DETECTED NOT DETECTED Final   Candida auris NOT DETECTED NOT DETECTED Final   Candida glabrata NOT DETECTED NOT DETECTED Final   Candida krusei NOT DETECTED NOT DETECTED Final   Candida parapsilosis NOT DETECTED NOT DETECTED Final   Candida tropicalis NOT DETECTED NOT DETECTED Final   Cryptococcus neoformans/gattii NOT DETECTED NOT DETECTED Final   Methicillin resistance mecA/C DETECTED (  A) NOT DETECTED Final     Comment: CRITICAL RESULT CALLED TO, READ BACK BY AND VERIFIED WITH: Performed at [facility], [place] [address], [place], [place] [address]     Renal Function: Recent Labs    04/06/21 1857 04/07/21 0419 04/08/21 0414  CREATININE 0.97 0.70 0.77   Estimated Creatinine Clearance: 139.9 mL/min (by C-G formula based on SCr of 0.77 mg/dL).  Radiologic Imaging: DG Chest 2 View  Result Date: 04/06/2021 CLINICAL DATA:  Cough, lethargy EXAM: CHEST - 2 VIEW COMPARISON:  05/28/2010 FINDINGS: Frontal and lateral views of the chest demonstrate an unremarkable cardiac silhouette. No acute airspace disease, effusion, or pneumothorax. No acute bony abnormalities. Moderate retained stool within the transverse colon. IMPRESSION: 1. No acute intrathoracic process. Electronically Signed   By: Randa Ngo M.D.   On: 04/06/2021 19:23   CT ABDOMEN PELVIS W CONTRAST  Result Date: 04/07/2021 CLINICAL DATA:  Redness/swelling to lower abdomen and groin, leukocytosis, sepsis, concern for Fournier's gangrene. EXAM: CT ABDOMEN AND PELVIS WITH CONTRAST TECHNIQUE: Multidetector CT imaging of the abdomen and pelvis was performed using the standard protocol following bolus administration of intravenous contrast. RADIATION DOSE REDUCTION: This exam was performed according to the departmental dose-optimization program which includes automated exposure control, adjustment of the mA and/or kV according to patient size and/or use of iterative reconstruction technique. CONTRAST:  161mL OMNIPAQUE IOHEXOL 300 MG/ML  SOLN COMPARISON:  None. FINDINGS: Motion degraded images. Lower chest: Mild dependent atelectasis in the bilateral lower lobes. Hepatobiliary: Liver is within normal limits. Gallbladder is unremarkable. No intrahepatic or extrahepatic ductal dilatation. Pancreas: Within normal limits. Spleen: Within normal limits. Adrenals/Urinary Tract: Adrenal glands are within normal limits. 18 mm left upper pole renal cyst (series  2/image 25). Right kidney is within normal limits. No hydronephrosis. Mildly thick-walled bladder. Stomach/Bowel: Stomach is within normal limits. No evidence of bowel obstruction. Appendix is not discretely visualized, reportedly surgically absent. Moderate colonic stool burden, suggesting constipation. Vascular/Lymphatic: No evidence of abdominal aortic aneurysm. No suspicious abdominopelvic lymphadenopathy. Reproductive: Heterogeneous enhancement of the right prostate (series 2/image 86), nonspecific. In this setting, correlate for prostatitis. Other: No abdominopelvic ascites. Musculoskeletal: Mild degenerative changes at T12-L1. No soft tissue gas/inflammatory changes in the perineal region to suggest Fournier's gangrene. IMPRESSION: No findings to suggest Fournier's gangrene. Heterogeneous enhancement of the prostate, nonspecific. In this setting, correlate for prostatitis. Moderate colonic stool burden, suggesting constipation. Mildly thick-walled bladder, raising the possibility of cystitis. Electronically Signed   By: Julian Hy M.D.   On: 04/07/2021 02:01    I independently reviewed the above imaging studies.  Impression/Recommendation 1.  UTI with probable associated prostatitis based on nonspecific CT findings.  There is no defined hypoechoic area within the prostate to suggest abscess.  Exam does not suggest abscess Plan/recommendation: Patient seems to be improving on antibiotics.  Await sensitivities on urine culture and hopefully can transition to oral antibiotic such as quinolone.  May need follow-up CT scan in a couple of weeks to reevaluate prostate unless clinical deterioration warrants scanning sooner than that.  Remi Haggard 04/08/2021, 2:09 PM       CC:

## 2021-04-08 NOTE — Discharge Summary (Signed)
Physician Discharge Summary   Patient: Jack [name] MRN: [id number] DOB: 1986/01/23  Admit date:     04/06/2021  Discharge date: 04/08/21  Discharge Physician: Antonieta Pert   PCP: Arthur Holms, NP   Recommendations at discharge:    Cbc/bmp in 1 wk from PCP Urology follow up in 2 wks.  Discharge Diagnoses: Acute metabolic encephalopathy/lethargy: In the setting of cellulitis and UTI.  Resolved.  Mentation at baseline continue multiple psychotropic home meds.   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 Mildly thickened urinary bladder possible cystitis on antibiotics as above  intellectual disability/living in group home: Resume home meds  Hospital Course:  36 y.o. male istory significant for schizoaffective disorder, intellectual disability, dental restorations and extractions on March 12, 2021, and recurrent boils in the right groin presents with 2 days history of lethargy right groin pain, not eating from group home.  In ED-afebrile, saturating well on room air, tachycardic to [id number], and with systolic blood pressure 98 and higher.  LABS NA-133 and CBC features a leukocytosis 23,500.  Lactic acid was normal.  Urinalysis with sterile pyuria and large leukocytes.  Cxr neg ,CT of the abdomen and pelvis notable for heterogenous enhancement of the prostate, mildly thickened urinary bladder wall, and moderate colonic stool burden.  Blood and urine cultures were collected and the patient was given 2.5 L of LR, cefepime, clindamycin, and vancomycin.   Patient was admitted for cellulitis on right groin and UTI. Seen by ID and Uro- now treating for presumed chronic prostatitis with 3-4 wk of doxy/levaquin and have pcp and uro follow up. He  is being discharged back to group home after discussing with ID and Urology         Consultants: ID, UROLOGY Procedures performed: NONE  Disposition: Group home Diet recommendation:  Regular diet  DISCHARGE MEDICATION: Allergies as of 04/08/2021       Reactions   Iodinated Contrast Media Other (See Comments)   Reaction unknown--per [facility].     Latex Rash   Shellfish-derived Products Rash        Medication List     STOP taking these medications    hydrOXYzine 50 MG tablet Commonly known as: ATARAX   LORazepam 1 MG tablet Commonly known as: ATIVAN       TAKE these medications    cloZAPine 100 MG tablet Commonly known as: CLOZARIL Take 1 tablet (100 mg total) by mouth 2 (two) times daily. For mood control What changed:  how much to take when to take this additional instructions   divalproex 500 MG DR tablet Commonly known as: DEPAKOTE Take 4 tablets (2,000 mg total) by mouth daily. For mood stabilization/seizures   doxycycline 100 MG tablet Commonly known as: VIBRA-TABS Take 1 tablet (100 mg total) by mouth every 12 (twelve) hours for 21 days.   gabapentin 300 MG capsule Commonly known as: NEURONTIN Take 1 capsule (300 mg total) by mouth 3 (three) times daily. For agitation   hydrOXYzine 50 MG capsule Commonly known as: VISTARIL Take 50 mg by mouth 4 (four) times daily.   levofloxacin 750 MG tablet Commonly known as: Levaquin Take 1 tablet (750 mg total) by mouth daily for 28 days.   lithium carbonate 450 MG  CR tablet Commonly known as: ESKALITH Take 2 tablets (900 mg total) by mouth daily. For mood stabilization   magnesium oxide 400 (241.3 Mg) MG tablet Commonly known as: MAG-OX Take 1 tablet (400 mg total) by mouth daily. For constipation   saccharomyces boulardii 250 MG capsule Commonly known as: Florastor Take 1 capsule (250 mg total) by mouth 2 (two) times daily.        Follow-up Information     Arthur Holms, NP Follow up  in 1 week(s).   Specialty: Nurse Practitioner Contact information: [place] [address] [id number]         Remi Haggard, MD Follow up in 2 week(s).   Specialty: Urology Contact information: [address] [place] [address] [phone]                 Discharge Exam: [name] Weights   04/06/21 1855  Weight: 77.1 kg    Condition at discharge: good  The results of significant diagnostics from this hospitalization (including imaging, microbiology, ancillary and laboratory) are listed below for reference.   Imaging Studies: DG Chest 2 View  Result Date: 04/06/2021 CLINICAL DATA:  Cough, lethargy EXAM: CHEST - 2 VIEW COMPARISON:  05/28/2010 FINDINGS: Frontal and lateral views of the chest demonstrate an unremarkable cardiac silhouette. No acute airspace disease, effusion, or pneumothorax. No acute bony abnormalities. Moderate retained stool within the transverse colon. IMPRESSION: 1. No acute intrathoracic process. Electronically Signed   By: Randa Ngo M.D.   On: 04/06/2021 19:23   CT ABDOMEN PELVIS W CONTRAST  Result Date: 04/07/2021 CLINICAL DATA:  Redness/swelling to lower abdomen and groin, leukocytosis, sepsis, concern for Fournier's gangrene. EXAM: CT ABDOMEN AND PELVIS WITH CONTRAST TECHNIQUE: Multidetector CT imaging of the abdomen and pelvis was performed using the standard protocol following bolus administration of intravenous contrast. RADIATION DOSE REDUCTION: This exam was performed according to the departmental dose-optimization program which includes automated exposure control, adjustment of the mA and/or kV according to patient size and/or use of iterative reconstruction technique. CONTRAST:  13mL OMNIPAQUE IOHEXOL 300 MG/ML  SOLN COMPARISON:  None. FINDINGS: Motion degraded images. Lower chest: Mild dependent atelectasis in the bilateral lower lobes. Hepatobiliary: Liver is within normal limits. Gallbladder is unremarkable. No  intrahepatic or extrahepatic ductal dilatation. Pancreas: Within normal limits. Spleen: Within normal limits. Adrenals/Urinary Tract: Adrenal glands are within normal limits. 18 mm left upper pole renal cyst (series 2/image 25). Right kidney is within normal limits. No hydronephrosis. Mildly thick-walled bladder. Stomach/Bowel: Stomach is within normal limits. No evidence of bowel obstruction. Appendix is not discretely visualized, reportedly surgically absent. Moderate colonic stool burden, suggesting constipation. Vascular/Lymphatic: No evidence of abdominal aortic aneurysm. No suspicious abdominopelvic lymphadenopathy. Reproductive: Heterogeneous enhancement of the right prostate (series 2/image 59), nonspecific. In this setting, correlate for prostatitis. Other: No abdominopelvic ascites. Musculoskeletal: Mild degenerative changes at T12-L1. No soft tissue gas/inflammatory changes in the perineal region to suggest Fournier's gangrene. IMPRESSION: No findings to suggest Fournier's gangrene. Heterogeneous enhancement of the prostate, nonspecific. In this setting, correlate for prostatitis. Moderate colonic stool burden, suggesting constipation. Mildly thick-walled bladder, raising the possibility of cystitis. Electronically Signed   By: Julian Hy M.D.   On: 04/07/2021 02:01    Microbiology: Results for orders placed or performed during the hospital encounter of 04/06/21  Urine Culture     Status: Abnormal (Preliminary result)   Collection Time: 04/06/21  6:56 PM   Specimen: Urine, Clean Catch  Result Value Ref  Range Status   Specimen Description   Final    URINE, CLEAN CATCH Performed at [facility], [address] [place], [place] [address]    Special Requests   Final    NONE Performed at [facility], [address] [place], [place] [address]    Culture (A)  Final    >=100,000 COLONIES/mL ESCHERICHIA COLI SUSCEPTIBILITIES TO FOLLOW Performed at  [facility], [address] [place], [place] [address]    Report Status PENDING  Incomplete  Resp Panel by RT-PCR (Flu A&B, Covid) Urine, Clean Catch     Status: None   Collection Time: 04/06/21  6:57 PM   Specimen: Urine, Clean Catch; Nasopharyngeal(NP) swabs in vial transport medium  Result Value Ref Range Status   SARS Coronavirus 2 by RT PCR NEGATIVE NEGATIVE Final    Comment: (NOTE) SARS-CoV-2 target nucleic acids are NOT DETECTED.  The SARS-CoV-2 RNA is generally detectable in upper respiratory specimens during the acute phase of infection. The lowest concentration of SARS-CoV-2 viral copies this assay can detect is 138 copies/mL. A negative result does not preclude SARS-Cov-2 infection and should not be used as the sole basis for treatment or other patient management decisions. A negative result may occur with  improper specimen collection/handling, submission of specimen other than nasopharyngeal swab, presence of viral mutation(s) within the areas targeted by this assay, and inadequate number of viral copies(<138 copies/mL). A negative result must be combined with clinical observations, patient history, and epidemiological information. The expected result is Negative.  Fact Sheet for Patients:  BloggerCourse.com  Fact Sheet for Healthcare Providers:  SeriousBroker.it  This test is no t yet approved or cleared by the [place] FDA and  has been authorized for detection and/or diagnosis of SARS-CoV-2 by FDA under an Emergency Use Authorization (EUA). This EUA will remain  in effect (meaning this test can be used) for the duration of the COVID-19 declaration under Section 564(b)(1) of the Act, 21 U.S.C.section 360bbb-3(b)(1), unless the authorization is terminated  or revoked sooner.       Influenza A by PCR NEGATIVE NEGATIVE Final   Influenza B by PCR NEGATIVE NEGATIVE Final    Comment: (NOTE) The Xpert  Xpress SARS-CoV-2/FLU/RSV plus assay is intended as an aid in the diagnosis of influenza from Nasopharyngeal swab specimens and should not be used as a sole basis for treatment. Nasal washings and aspirates are unacceptable for Xpert Xpress SARS-CoV-2/FLU/RSV testing.  Fact Sheet for Patients: BloggerCourse.com  Fact Sheet for Healthcare Providers: SeriousBroker.it  This test is not yet approved or cleared by the [place] FDA and has been authorized for detection and/or diagnosis of SARS-CoV-2 by FDA under an Emergency Use Authorization (EUA). This EUA will remain in effect (meaning this test can be used) for the duration of the COVID-19 declaration under Section 564(b)(1) of the Act, 21 U.S.C. section 360bbb-3(b)(1), unless the authorization is terminated or revoked.  Performed at [facility], [address], [place], [place] [address]   Blood culture (routine x 2)     Status: None (Preliminary result)   Collection Time: 04/06/21  9:00 PM   Specimen: Left Antecubital; Blood  Result Value Ref Range Status   Specimen Description   Final    LEFT ANTECUBITAL BLOOD Performed at [facility], [address] [place], [place] [address]    Special Requests   Final    BOTTLES DRAWN AEROBIC AND ANAEROBIC Blood Culture adequate volume  Performed at [facility], [place] [address], [place], [place] [address]    Culture  Setup Time   Final    GRAM POSITIVE COCCI AEROBIC BOTTLE ONLY CRITICAL RESULT CALLED TO, READ BACK BY AND VERIFIED WITH: L POINDEXTER,PHARMD@[email or web address]  04/08/21 [place]    Culture   Final    GRAM POSITIVE COCCI TOO YOUNG TO READ Performed at [facility], [place] [address], [place], [place] [address]    Report Status PENDING  Incomplete  Blood culture (routine x 2)     Status: None (Preliminary result)   Collection Time: 04/06/21  9:00 PM   Specimen: Right Antecubital; Blood   Result Value Ref Range Status   Specimen Description   Final    RIGHT ANTECUBITAL BLOOD Performed at [facility], [place] [address], [place], [place] [address]    Special Requests   Final    BOTTLES DRAWN AEROBIC AND ANAEROBIC Blood Culture adequate volume Performed at [place] [address] [place], [place] [address]    Culture  Setup Time   Final    GRAM POSITIVE COCCI ANAEROBIC BOTTLE ONLY CRITICAL VALUE NOTED.  VALUE IS CONSISTENT WITH PREVIOUSLY REPORTED AND CALLED VALUE.    Culture   Final    GRAM POSITIVE COCCI TOO YOUNG TO READ Performed at [facility], [place] [address], [place],  [address]    Report Status PENDING  Incomplete  Blood Culture ID Panel (Reflexed)     Status: Abnormal   Collection Time: 04/06/21  9:00 PM  Result Value Ref Range Status   Enterococcus faecalis NOT DETECTED NOT DETECTED Final   Enterococcus Faecium NOT DETECTED NOT DETECTED Final   Listeria monocytogenes NOT DETECTED NOT DETECTED Final   Staphylococcus species DETECTED (A) NOT DETECTED Final    Comment: CRITICAL RESULT CALLED TO, READ BACK BY AND VERIFIED WITH: L POINDEXTER,PHARMD@0410  04/08/21 [place]    Staphylococcus aureus (BCID) NOT DETECTED NOT DETECTED Final   Staphylococcus epidermidis DETECTED (A) NOT DETECTED Final    Comment: Methicillin (oxacillin) resistant coagulase negative staphylococcus. Possible blood culture contaminant (unless isolated from more than one blood culture draw or clinical case suggests pathogenicity). No antibiotic treatment is indicated for blood  culture contaminants. CRITICAL RESULT CALLED TO, READ BACK BY AND VERIFIED WITH: L POINDEXTER,PHARMD@0410  04/08/21 [place]    Staphylococcus lugdunensis NOT DETECTED NOT DETECTED Final   Streptococcus species NOT DETECTED NOT DETECTED Final   Streptococcus agalactiae NOT DETECTED NOT DETECTED Final   Streptococcus pneumoniae NOT DETECTED NOT DETECTED Final   Streptococcus pyogenes  NOT DETECTED NOT DETECTED Final   A.calcoaceticus-baumannii NOT DETECTED NOT DETECTED Final   Bacteroides fragilis NOT DETECTED NOT DETECTED Final   Enterobacterales NOT DETECTED NOT DETECTED Final   Enterobacter cloacae complex NOT DETECTED NOT DETECTED Final   Escherichia coli NOT DETECTED NOT DETECTED Final   Klebsiella aerogenes NOT DETECTED NOT DETECTED Final   Klebsiella oxytoca NOT DETECTED NOT DETECTED Final   Klebsiella pneumoniae NOT DETECTED NOT DETECTED Final   Proteus species NOT DETECTED NOT DETECTED Final   Salmonella species NOT DETECTED NOT DETECTED Final   Serratia marcescens NOT DETECTED NOT DETECTED Final   Haemophilus influenzae NOT DETECTED NOT DETECTED Final   Neisseria meningitidis NOT DETECTED NOT DETECTED Final   Pseudomonas aeruginosa NOT DETECTED NOT DETECTED Final   Stenotrophomonas maltophilia NOT DETECTED NOT DETECTED Final   Candida albicans NOT DETECTED NOT DETECTED Final   Candida auris NOT DETECTED NOT DETECTED Final  Candida glabrata NOT DETECTED NOT DETECTED Final   Candida krusei NOT DETECTED NOT DETECTED Final   Candida parapsilosis NOT DETECTED NOT DETECTED Final   Candida tropicalis NOT DETECTED NOT DETECTED Final   Cryptococcus neoformans/gattii NOT DETECTED NOT DETECTED Final   Methicillin resistance mecA/C DETECTED (A) NOT DETECTED Final    Comment: CRITICAL RESULT CALLED TO, READ BACK BY AND VERIFIED WITH: Performed at [place] [address], [place], [place] [address]   Labs: CBC: Recent Labs  Lab 04/06/21 1857 04/07/21 0419 04/08/21 0414  WBC 23.5* 16.9* 12.0*  NEUTROABS 19.3*  --   --   HGB 12.6* 10.8* 10.4*  HCT 37.6* 32.7* 31.4*  MCV 87.9 87.4 87.0  PLT 179 161 0000000   Basic Metabolic Panel: Recent Labs  Lab 04/06/21 1857 04/07/21 0419 04/08/21 0414  NA 133* 137 135  K 4.1 4.6 3.5  CL 97* 107 102  CO2 27 25 28   GLUCOSE 130* 127* 78  BUN 19 15 13   CREATININE 0.97 0.70 0.77  CALCIUM 9.7 9.2 8.5*  MG  --   2.2  --    Liver Function Tests: No results for input(s): AST, ALT, ALKPHOS, BILITOT, PROT, ALBUMIN in the last 168 hours. CBG: No results for input(s): GLUCAP in the last 168 hours.  Discharge time spent:  25 minutes.  Signed: Antonieta Pert, MD [facility] 04/08/2021

## 2021-04-08 NOTE — Evaluation (Signed)
Physical Therapy Evaluation Patient Details Name: Jack [name] MRN: [id number] DOB: 1985/04/05 Today's Date: 04/08/2021  History of Present Illness  36 y.o. male admitted 04/06/21. Pt with medical history significant for schizoaffective disorder, intellectual disability, dental restorations and extractions on March 12, 2021, and recurrent boils in the right groin who presents the emergency department with 2 days of lethargy and right groin pain. Dx of cellulitis and UTI.  Clinical Impression  Pt is mobilizing well. He ambulated 400' without an assistive device with no loss of balance. From a PT standpoint, he is safe to DC to his group home. No further PT indicated. PT signing off.        Recommendations for follow up therapy are one component of a multi-disciplinary discharge planning process, led by the attending physician.  Recommendations may be updated based on patient status, additional functional criteria and insurance authorization.  Follow Up Recommendations No PT follow up    Assistance Recommended at Discharge Set up Supervision/Assistance  Patient can return home with the following  Assist for transportation;Direct supervision/assist for medications management;Direct supervision/assist for financial management    Equipment Recommendations None recommended by PT  Recommendations for Other Services       Functional Status Assessment Patient has not had a recent decline in their functional status     Precautions / Restrictions Precautions Precautions: None Restrictions Weight Bearing Restrictions: No      Mobility  Bed Mobility Overal bed mobility: Independent                  Transfers Overall transfer level: Independent                      Ambulation/Gait Ambulation/Gait assistance: Independent Gait Distance (Feet): 400 Feet Assistive device: None Gait Pattern/deviations: WFL(Within Functional Limits) Gait velocity: increased     General  Gait Details: steady, no loss of balance  Stairs            Wheelchair Mobility    Modified Rankin (Stroke Patients Only)       Balance Overall balance assessment: No apparent balance deficits (not formally assessed)                                           Pertinent Vitals/Pain Pain Assessment Pain Assessment: No/denies pain Faces Pain Scale: No hurt    Home Living Family/patient expects to be discharged to:: Group home                   Additional Comments: has a roommate. no assist with basic ADLs. Reports some stairs at the group home.    Prior Function Prior Level of Function : Independent/Modified Independent             Mobility Comments: walks without AD       Hand Dominance        Extremity/Trunk Assessment   Upper Extremity Assessment Upper Extremity Assessment: Defer to OT evaluation    Lower Extremity Assessment Lower Extremity Assessment: Overall WFL for tasks assessed    Cervical / Trunk Assessment Cervical / Trunk Assessment: Normal  Communication   Communication: No difficulties  Cognition Arousal/Alertness: Awake/alert Behavior During Therapy: Flat affect Overall Cognitive Status: History of cognitive impairments - at baseline  General Comments: pleasant, follows commands        General Comments      Exercises     Assessment/Plan    PT Assessment Patient does not need any further PT services  PT Problem List         PT Treatment Interventions      PT Goals (Current goals can be found in the Care Plan section)  Acute Rehab PT Goals PT Goal Formulation: All assessment and education complete, DC therapy    Frequency       Co-evaluation               AM-PAC PT "6 Clicks" Mobility  Outcome Measure Help needed turning from your back to your side while in a flat bed without using bedrails?: None Help needed moving from lying on your  back to sitting on the side of a flat bed without using bedrails?: None Help needed moving to and from a bed to a chair (including a wheelchair)?: None Help needed standing up from a chair using your arms (e.g., wheelchair or bedside chair)?: None Help needed to walk in hospital room?: None Help needed climbing 3-5 steps with a railing? : None 6 Click Score: 24    End of Session   Activity Tolerance: Patient tolerated treatment well Patient left: in bed;with call bell/phone within reach;with bed alarm set Nurse Communication: Mobility status      Time: 5809-9833 PT Time Calculation (min) (ACUTE ONLY): 10 min   Charges:   PT Evaluation $PT Eval Low Complexity: 1 Low         Tamala Ser PT 04/08/2021  Acute Rehabilitation Services Pager [phone] Office [phone]

## 2021-04-08 NOTE — Progress Notes (Signed)
Urine sample sent to Lab around 1422

## 2021-04-08 NOTE — Progress Notes (Addendum)
PROGRESS NOTE RAFER KAPRAL  [id number] DOB: April 17, 1985 DOA: 04/06/2021 PCP: Arthur Holms, NP   Brief Narrative/Hospital Course: Rochele Pages, 36 y.o. male istory significant for schizoaffective disorder, intellectual disability, dental restorations and extractions on March 12, 2021, and recurrent boils in the right groin presents with 2 days history of lethargy right groin pain, not eating from group home.  In ED-afebrile, saturating well on room air, tachycardic to [id number], and with systolic blood pressure 98 and higher.  LABS NA-133 and CBC features a leukocytosis 23,500.  Lactic acid was normal.  Urinalysis with sterile pyuria and large leukocytes.  Cxr neg ,CT of the abdomen and pelvis notable for heterogenous enhancement of the prostate, mildly thickened urinary bladder wall, and moderate colonic stool burden.  Blood and urine cultures were collected and the patient was given 2.5 L of LR, cefepime, clindamycin, and vancomycin.   Patient was admitted for cellulitis on right groin and UTI    Subjective: Seen and examined this morning, alert awake with baseline mentation.  Right groin pain is improving. Assessment and Plan:  Acute metabolic encephalopathy/lethargy: In the setting of cellulitis and UTI.  Resolved.  Mentation at baseline continue multiple psychotropic home meds.   Right groin cellulitis Gram-negative rod UTI Gram-positive cocci bacteremia-BC ID is Staph epidermidis with Mthicillin resistant- 2 out of 4 bottles: Right groin is less tender and red. Urine culture with gram-negative rods more than 100,000 colonies.  Blood culture +2/5 bottle with methicillin-resistant Staph epidermidis.  CT pelvis reviewed.Continue with ceftriaxone/vancomycin pharmacy dosing.  We will get ID to see.Leukocytosis nicely improving.  CT pelvis had shown hydrations on cement of the prostate nonspecific could be due to prostatitis patient has been pain with bowel movement, Dr. Milford Cage consulted. Recent  Labs  Lab 04/06/21 1857 04/06/21 2100 04/07/21 0419 04/08/21 0414  WBC 23.5*  --  16.9* 12.0*  LATICACIDVEN  --  1.4  --   --    Constipation with moderate stool burden, continue laxatives. Mildly thickened urinary bladder possible cystitis on antibiotics as above  intellectual disability/living in group home: Resume home meds-  DVT prophylaxis: enoxaparin (LOVENOX) injection 40 mg Start: 04/07/21 1000 Code Status:   Code Status: Full Code Family Communication: plan of care discussed with patient at bedside. Mother not at the bedside today Disposition: Currently not medically stable for discharge. Status is: Admitted as observation Remains hospitalized for ongoing management of bacteremia and UTI  Objective: Vitals last 24 hrs: Vitals:   04/07/21 1537 04/07/21 2017 04/08/21 0038 04/08/21 0431  BP: 122/87 113/69 96/66 104/69  Pulse: 92 96 94 87  Resp: 19 18 17 16   Temp: 97.7 F (36.5 C) 98 F (36.7 C) 98.9 F (37.2 C) 98.9 F (37.2 C)  TempSrc:      SpO2: 100% 98% 97% 99%  Weight:      Height:       Weight change:   Physical Examination: General exam: AA,older than stated age, intellectually disabled, pleasant weak appearing. HEENT:Oral mucosa moist, Ear/Nose WNL grossly, dentition normal. Respiratory system: bilaterally diminished BS, no use of accessory muscle Cardiovascular system: S1 & S2 +, No JVD,. Gastrointestinal system: Abdomen soft,NT,ND, BS+ Nervous System:Alert, awake, moving extremities and grossly nonfocal Extremities: LE edema neg, right groin area is tender but elicited, palpable induration present distal peripheral pulses palpable.  Skin: No rashes,no icterus. MSK: Normal muscle bulk,tone, power  Medications reviewed:  Scheduled Meds:  cloZAPine  100 mg Oral 2 times per day   And  cloZAPine  350 mg Oral QHS   divalproex  2,000 mg Oral Daily   docusate sodium  100 mg Oral BID   enoxaparin (LOVENOX) injection  40 mg Subcutaneous Q24H    gabapentin  300 mg Oral TID   lithium carbonate  900 mg Oral Daily   magnesium oxide  400 mg Oral Daily   Continuous Infusions:  cefTRIAXone (ROCEPHIN)  IV 2 g (04/08/21 0510)   vancomycin        Diet Order             Diet regular Room service appropriate? Yes; Fluid consistency: Thin  Diet effective now                            Intake/Output Summary (Last 24 hours) at 04/08/2021 1004 Last data filed at 04/07/2021 1730 Gross per 24 hour  Intake 480 ml  Output --  Net 480 ml   Net IO Since Admission: -470 mL [04/08/21 1004]  Wt Readings from Last 3 Encounters:  04/06/21 77.1 kg  03/12/21 78 kg  03/25/18 81.6 kg     Unresulted Labs (From admission, onward)     Start     Ordered   04/14/21 0500  Creatinine, serum  (enoxaparin (LOVENOX)    CrCl >/= 30 ml/min)  Weekly,   R     Comments: while on enoxaparin therapy    04/07/21 0402   04/07/21 [id number]  Basic metabolic panel  Daily,   R      04/07/21 0402   04/07/21 0500  CBC  Daily,   R      04/07/21 0402   04/06/21 2041  Culture, blood (single)  (Undifferentiated -> Now sepsis confirmed (treatment and sepsis specific nursing orders))  ONCE - STAT,   STAT        04/06/21 2041          Data Reviewed: I have personally reviewed following labs and imaging studies CBC: Recent Labs  Lab 04/06/21 1857 04/07/21 0419 04/08/21 0414  WBC 23.5* 16.9* 12.0*  NEUTROABS 19.3*  --   --   HGB 12.6* 10.8* 10.4*  HCT 37.6* 32.7* 31.4*  MCV 87.9 87.4 87.0  PLT 179 161 0000000   Basic Metabolic Panel: Recent Labs  Lab 04/06/21 1857 04/07/21 0419 04/08/21 0414  NA 133* 137 135  K 4.1 4.6 3.5  CL 97* 107 102  CO2 27 25 28   GLUCOSE 130* 127* 78  BUN 19 15 13   CREATININE 0.97 0.70 0.77  CALCIUM 9.7 9.2 8.5*  MG  --  2.2  --    GFR: Estimated Creatinine Clearance: 139.9 mL/min (by C-G formula based on SCr of 0.77 mg/dL). Liver Function Tests: No results for input(s): AST, ALT, ALKPHOS, BILITOT, PROT, ALBUMIN in the  last 168 hours. No results for input(s): LIPASE, AMYLASE in the last 168 hours. No results for input(s): AMMONIA in the last 168 hours. Coagulation Profile: No results for input(s): INR, PROTIME in the last 168 hours. Cardiac Enzymes: No results for input(s): CKTOTAL, CKMB, CKMBINDEX, TROPONINI in the last 168 hours. BNP (last 3 results) No results for input(s): PROBNP in the last 8760 hours. HbA1C: No results for input(s): HGBA1C in the last 72 hours. CBG: No results for input(s): GLUCAP in the last 168 hours. Lipid Profile: No results for input(s): CHOL, HDL, LDLCALC, TRIG, CHOLHDL, LDLDIRECT in the last 72 hours. Thyroid Function Tests: No results for input(s): TSH, T4TOTAL,  FREET4, T3FREE, THYROIDAB in the last 72 hours. Anemia Panel: No results for input(s): VITAMINB12, FOLATE, FERRITIN, TIBC, IRON, RETICCTPCT in the last 72 hours. Sepsis Labs: Recent Labs  Lab 04/06/21 2100  LATICACIDVEN 1.4    Recent Results (from the past 240 hour(s))  Urine Culture     Status: Abnormal (Preliminary result)   Collection Time: 04/06/21  6:56 PM   Specimen: Urine, Clean Catch  Result Value Ref Range Status   Specimen Description   Final    URINE, CLEAN CATCH Performed at [facility], [place] [address], [place], [place] [address]    Special Requests   Final    NONE Performed at [facility], [place] [address], [place], [place] [address]    Culture (A)  Final    >=100,000 COLONIES/mL GRAM NEGATIVE RODS SUSCEPTIBILITIES TO FOLLOW Performed at [facility], [place] [address] [place], [place] [address]    Report Status PENDING  Incomplete  Resp Panel by RT-PCR (Flu A&B, Covid) Urine, Clean Catch     Status: None   Collection Time: 04/06/21  6:57 PM   Specimen: Urine, Clean Catch; Nasopharyngeal(NP) swabs in vial transport medium  Result Value Ref Range Status   SARS Coronavirus 2 by RT PCR NEGATIVE NEGATIVE Final    Comment: (NOTE) SARS-CoV-2  target nucleic acids are NOT DETECTED.  The SARS-CoV-2 RNA is generally detectable in upper respiratory specimens during the acute phase of infection. The lowest concentration of SARS-CoV-2 viral copies this assay can detect is 138 copies/mL. A negative result does not preclude SARS-Cov-2 infection and should not be used as the sole basis for treatment or other patient management decisions. A negative result may occur with  improper specimen collection/handling, submission of specimen other than nasopharyngeal swab, presence of viral mutation(s) within the areas targeted by this assay, and inadequate number of viral copies(<138 copies/mL). A negative result must be combined with clinical observations, patient history, and epidemiological information. The expected result is Negative.  Fact Sheet for Patients:  EntrepreneurPulse.com.au  Fact Sheet for Healthcare Providers:  IncredibleEmployment.be  This test is no t yet approved or cleared by the [place] FDA and  has been authorized for detection and/or diagnosis of SARS-CoV-2 by FDA under an Emergency Use Authorization (EUA). This EUA will remain  in effect (meaning this test can be used) for the duration of the COVID-19 declaration under Section 564(b)(1) of the Act, 21 U.S.C.section 360bbb-3(b)(1), unless the authorization is terminated  or revoked sooner.       Influenza A by PCR NEGATIVE NEGATIVE Final   Influenza B by PCR NEGATIVE NEGATIVE Final    Comment: (NOTE) The Xpert Xpress SARS-CoV-2/FLU/RSV plus assay is intended as an aid in the diagnosis of influenza from Nasopharyngeal swab specimens and should not be used as a sole basis for treatment. Nasal washings and aspirates are unacceptable for Xpert Xpress SARS-CoV-2/FLU/RSV testing.  Fact Sheet for Patients: EntrepreneurPulse.com.au  Fact Sheet for Healthcare  Providers: IncredibleEmployment.be  This test is not yet approved or cleared by the [place] FDA and has been authorized for detection and/or diagnosis of SARS-CoV-2 by FDA under an Emergency Use Authorization (EUA). This EUA will remain in effect (meaning this test can be used) for the duration of the COVID-19 declaration under Section 564(b)(1) of the Act, 21 U.S.C. section 360bbb-3(b)(1), unless the authorization is terminated or revoked.  Performed at [facility], [place] [address], [place],  [address]   Blood culture (routine  x 2)     Status: None (Preliminary result)   Collection Time: 04/06/21  9:00 PM   Specimen: Left Antecubital; Blood  Result Value Ref Range Status   Specimen Description   Final    LEFT ANTECUBITAL BLOOD Performed at [place] [address] [place], [place] [address]    Special Requests   Final    BOTTLES DRAWN AEROBIC AND ANAEROBIC Blood Culture adequate volume Performed at [place] [address], [place], [place] [address]    Culture  Setup Time   Final    GRAM POSITIVE COCCI AEROBIC BOTTLE ONLY CRITICAL RESULT CALLED TO, READ BACK BY AND VERIFIED WITH: L POINDEXTER,[email or web address]@[id number]  04/08/21 [place]    Culture   Final    GRAM POSITIVE COCCI TOO YOUNG TO READ Performed at [facility], [place] [address], [place], [place] [address]    Report Status PENDING  Incomplete  Blood culture (routine x 2)     Status: None (Preliminary result)   Collection Time: 04/06/21  9:00 PM   Specimen: Right Antecubital; Blood  Result Value Ref Range Status   Specimen Description   Final    RIGHT ANTECUBITAL BLOOD Performed at [facility], [place] [address], [place], [place] [address]    Special Requests   Final    BOTTLES DRAWN AEROBIC AND ANAEROBIC Blood Culture adequate volume Performed at [place] [address], [place], [place] [address]    Culture   Setup Time   Final    GRAM POSITIVE COCCI ANAEROBIC BOTTLE ONLY CRITICAL VALUE NOTED.  VALUE IS CONSISTENT WITH PREVIOUSLY REPORTED AND CALLED VALUE.    Culture   Final    GRAM POSITIVE COCCI TOO YOUNG TO READ Performed at [facility], [place] [address] [place], [place] [address]    Report Status PENDING  Incomplete  Blood Culture ID Panel (Reflexed)     Status: Abnormal   Collection Time: 04/06/21  9:00 PM  Result Value Ref Range Status   Enterococcus faecalis NOT DETECTED NOT DETECTED Final   Enterococcus Faecium NOT DETECTED NOT DETECTED Final   Listeria monocytogenes NOT DETECTED NOT DETECTED Final   Staphylococcus species DETECTED (A) NOT DETECTED Final    Comment: CRITICAL RESULT CALLED TO, READ BACK BY AND VERIFIED WITH: L POINDEXTER,[email or web address]@0410  04/08/21 [place]    Staphylococcus aureus (BCID) NOT DETECTED NOT DETECTED Final   Staphylococcus epidermidis DETECTED (A) NOT DETECTED Final    Comment: Methicillin (oxacillin) resistant coagulase negative staphylococcus. Possible blood culture contaminant (unless isolated from more than one blood culture draw or clinical case suggests pathogenicity). No antibiotic treatment is indicated for blood  culture contaminants. CRITICAL RESULT CALLED TO, READ BACK BY AND VERIFIED WITH: L POINDEXTER,PHARMD@0410  04/08/21 [place]    Staphylococcus lugdunensis NOT DETECTED NOT DETECTED Final   Streptococcus species NOT DETECTED NOT DETECTED Final   Streptococcus agalactiae NOT DETECTED NOT DETECTED Final   Streptococcus pneumoniae NOT DETECTED NOT DETECTED Final   Streptococcus pyogenes NOT DETECTED NOT DETECTED Final   A.calcoaceticus-baumannii NOT DETECTED NOT DETECTED Final   Bacteroides fragilis NOT DETECTED NOT DETECTED Final   Enterobacterales NOT DETECTED NOT DETECTED Final   Enterobacter cloacae complex NOT DETECTED NOT DETECTED Final   Escherichia coli NOT DETECTED NOT DETECTED Final   Klebsiella aerogenes NOT DETECTED NOT DETECTED Final    Klebsiella oxytoca NOT DETECTED NOT DETECTED Final   Klebsiella pneumoniae NOT DETECTED NOT DETECTED Final   Proteus species NOT DETECTED  NOT DETECTED Final   Salmonella species NOT DETECTED NOT DETECTED Final   Serratia marcescens NOT DETECTED NOT DETECTED Final   Haemophilus influenzae NOT DETECTED NOT DETECTED Final   Neisseria meningitidis NOT DETECTED NOT DETECTED Final   Pseudomonas aeruginosa NOT DETECTED NOT DETECTED Final   Stenotrophomonas maltophilia NOT DETECTED NOT DETECTED Final   Candida albicans NOT DETECTED NOT DETECTED Final   Candida auris NOT DETECTED NOT DETECTED Final   Candida glabrata NOT DETECTED NOT DETECTED Final   Candida krusei NOT DETECTED NOT DETECTED Final   Candida parapsilosis NOT DETECTED NOT DETECTED Final   Candida tropicalis NOT DETECTED NOT DETECTED Final   Cryptococcus neoformans/gattii NOT DETECTED NOT DETECTED Final   Methicillin resistance mecA/C DETECTED (A) NOT DETECTED Final    Comment: CRITICAL RESULT CALLED TO, READ BACK BY AND VERIFIED WITH: Performed at [facility], [place] [address] [place], [place] [address]     Antimicrobials: Anti-infectives (From admission, onward)    Start     Dose/Rate Route Frequency Ordered Stop   04/08/21 1800  vancomycin (VANCOREADY) IVPB 1500 mg/300 mL        1,500 mg 150 mL/hr over 120 Minutes Intravenous Every 12 hours 04/08/21 0454     04/08/21 0600  vancomycin (VANCOREADY) IVPB 1500 mg/300 mL        1,500 mg 150 mL/hr over 120 Minutes Intravenous  Once 04/08/21 0452 04/08/21 0806   04/07/21 0500  cefTRIAXone (ROCEPHIN) 2 g in sodium chloride 0.9 % 100 mL IVPB        2 g 200 mL/hr over 30 Minutes Intravenous Every 24 hours 04/07/21 0402 04/14/21 0459   04/06/21 2115  vancomycin (VANCOREADY) IVPB 1750 mg/350 mL        1,750 mg 175 mL/hr over 120 Minutes Intravenous  Once 04/06/21 2101 04/07/21 0000   04/06/21 2045  ceFEPIme (MAXIPIME) 2 g in sodium chloride 0.9 % 100 mL IVPB        2 g 200  mL/hr over 30 Minutes Intravenous  Once 04/06/21 2041 04/06/21 2148   04/06/21 2045  vancomycin (VANCOCIN) IVPB 1000 mg/200 mL premix  Status:  Discontinued        1,000 mg 200 mL/hr over 60 Minutes Intravenous  Once 04/06/21 2041 04/06/21 2101   04/06/21 2045  clindamycin (CLEOCIN) IVPB 600 mg        600 mg 100 mL/hr over 30 Minutes Intravenous  Once 04/06/21 2041 04/06/21 2148      Culture/Microbiology    Component Value Date/Time   SDES  04/06/2021 2100    LEFT ANTECUBITAL BLOOD Performed at [facility], [place] [address], [place], [place] [address]    SDES  04/06/2021 2100    RIGHT ANTECUBITAL BLOOD Performed at [place] [address] [place], [place] [address]    SPECREQUEST  04/06/2021 2100    BOTTLES DRAWN AEROBIC AND ANAEROBIC Blood Culture adequate volume Performed at [place] [address] [place], [place] [address]    SPECREQUEST  04/06/2021 2100    BOTTLES DRAWN AEROBIC AND ANAEROBIC Blood Culture adequate volume Performed at [place] [address], [place], [place] [address]    CULT  04/06/2021 2100    GRAM POSITIVE COCCI TOO YOUNG TO READ Performed at [facility], [place] [address], [place], [place] [address]    CULT  04/06/2021 2100    GRAM POSITIVE COCCI TOO YOUNG TO READ Performed at [facility], [place] [address], [place], [place] [address]    REPTSTATUS PENDING 04/06/2021 2100   REPTSTATUS PENDING 04/06/2021 2100    Other culture-see note  Radiology Studies: DG Chest 2 View  Result Date: 04/06/2021 CLINICAL DATA:  Cough, lethargy EXAM: CHEST - 2 VIEW COMPARISON:  05/28/2010 FINDINGS: Frontal and lateral views of the chest demonstrate an unremarkable cardiac silhouette. No acute airspace disease, effusion, or pneumothorax. No acute bony abnormalities. Moderate retained stool within the transverse colon. IMPRESSION: 1. No acute intrathoracic process. Electronically Signed   By:  Randa Ngo M.D.   On: 04/06/2021 19:23   CT ABDOMEN PELVIS W CONTRAST  Result Date: 04/07/2021 CLINICAL DATA:  Redness/swelling to lower abdomen and groin, leukocytosis, sepsis, concern for Fournier's gangrene. EXAM: CT ABDOMEN AND PELVIS WITH CONTRAST TECHNIQUE: Multidetector CT imaging of the abdomen and pelvis was performed using the standard protocol following bolus administration of intravenous contrast. RADIATION DOSE REDUCTION: This exam was performed according to the departmental dose-optimization program which includes automated exposure control, adjustment of the mA and/or kV according to patient size and/or use of iterative reconstruction technique. CONTRAST:  170mL OMNIPAQUE IOHEXOL 300 MG/ML  SOLN COMPARISON:  None. FINDINGS: Motion degraded images. Lower chest: Mild dependent atelectasis in the bilateral lower lobes. Hepatobiliary: Liver is within normal limits. Gallbladder is unremarkable. No intrahepatic or extrahepatic ductal dilatation. Pancreas: Within normal limits. Spleen: Within normal limits. Adrenals/Urinary Tract: Adrenal glands are within normal limits. 18 mm left upper pole renal cyst (series 2/image 25). Right kidney is within normal limits. No hydronephrosis. Mildly thick-walled bladder. Stomach/Bowel: Stomach is within normal limits. No evidence of bowel obstruction. Appendix is not discretely visualized, reportedly surgically absent. Moderate colonic stool burden, suggesting constipation. Vascular/Lymphatic: No evidence of abdominal aortic aneurysm. No suspicious abdominopelvic lymphadenopathy. Reproductive: Heterogeneous enhancement of the right prostate (series 2/image 37), nonspecific. In this setting, correlate for prostatitis. Other: No abdominopelvic ascites. Musculoskeletal: Mild degenerative changes at T12-L1. No soft tissue gas/inflammatory changes in the perineal region to suggest Fournier's gangrene. IMPRESSION: No findings to suggest Fournier's gangrene.  Heterogeneous enhancement of the prostate, nonspecific. In this setting, correlate for prostatitis. Moderate colonic stool burden, suggesting constipation. Mildly thick-walled bladder, raising the possibility of cystitis. Electronically Signed   By: Julian Hy M.D.   On: 04/07/2021 02:01     LOS: 0 days   Antonieta Pert, MD [facility]  04/08/2021, 10:04 AM

## 2021-04-09 LAB — URINE CULTURE: Culture: >=100000 — AB

## 2021-04-10 LAB — CULTURE, BLOOD (ROUTINE X 2)
Special Requests: ADEQUATE
Special Requests: ADEQUATE

## 2021-04-13 LAB — CULTURE, BLOOD (ROUTINE X 2): Culture: NO GROWTH

## 2021-06-25 ENCOUNTER — Inpatient Hospital Stay (HOSPITAL_BASED_OUTPATIENT_CLINIC_OR_DEPARTMENT_OTHER)
Admission: EM | Admit: 2021-06-25 | Discharge: 2021-06-28 | DRG: 580 | Disposition: A | Discharge status: Home / Self Care | Payer: Medicare HMO | Attending: Internal Medicine | Admitting: Internal Medicine

## 2021-06-25 ENCOUNTER — Emergency Department (HOSPITAL_BASED_OUTPATIENT_CLINIC_OR_DEPARTMENT_OTHER): Payer: Medicare HMO

## 2021-06-25 ENCOUNTER — Other Ambulatory Visit: Payer: Self-pay

## 2021-06-25 DIAGNOSIS — L02214 Cutaneous abscess of groin: Principal | ICD-10-CM | POA: Diagnosis present

## 2021-06-25 DIAGNOSIS — L03818 Cellulitis of other sites: Principal | ICD-10-CM

## 2021-06-25 DIAGNOSIS — K5900 Constipation, unspecified: Secondary | ICD-10-CM | POA: Diagnosis present

## 2021-06-25 DIAGNOSIS — N492 Inflammatory disorders of scrotum: Secondary | ICD-10-CM | POA: Diagnosis present

## 2021-06-25 DIAGNOSIS — Z79899 Other long term (current) drug therapy: Secondary | ICD-10-CM

## 2021-06-25 DIAGNOSIS — L0390 Cellulitis, unspecified: Secondary | ICD-10-CM | POA: Diagnosis present

## 2021-06-25 DIAGNOSIS — D696 Thrombocytopenia, unspecified: Secondary | ICD-10-CM

## 2021-06-25 DIAGNOSIS — F419 Anxiety disorder, unspecified: Secondary | ICD-10-CM | POA: Diagnosis present

## 2021-06-25 DIAGNOSIS — D6959 Other secondary thrombocytopenia: Secondary | ICD-10-CM | POA: Diagnosis present

## 2021-06-25 DIAGNOSIS — L0291 Cutaneous abscess, unspecified: Secondary | ICD-10-CM | POA: Diagnosis present

## 2021-06-25 DIAGNOSIS — D649 Anemia, unspecified: Secondary | ICD-10-CM | POA: Diagnosis present

## 2021-06-25 DIAGNOSIS — F250 Schizoaffective disorder, bipolar type: Secondary | ICD-10-CM | POA: Diagnosis present

## 2021-06-25 DIAGNOSIS — F79 Unspecified intellectual disabilities: Secondary | ICD-10-CM | POA: Diagnosis present

## 2021-06-25 DIAGNOSIS — L03314 Cellulitis of groin: Secondary | ICD-10-CM | POA: Diagnosis not present

## 2021-06-25 DIAGNOSIS — F840 Autistic disorder: Secondary | ICD-10-CM | POA: Diagnosis present

## 2021-06-25 LAB — COMPREHENSIVE METABOLIC PANEL
ALT: 5 U/L (ref 0–44)
AST: 9 U/L — ABNORMAL LOW (ref 15–41)
Albumin: 4.2 g/dL (ref 3.5–5.0)
Alkaline Phosphatase: 57 U/L (ref 38–126)
Anion gap: 7 (ref 5–15)
BUN: 6 mg/dL (ref 6–20)
CO2: 28 mmol/L (ref 22–32)
Calcium: 10 mg/dL (ref 8.9–10.3)
Chloride: 103 mmol/L (ref 98–111)
Creatinine, Ser: 1.03 mg/dL (ref 0.61–1.24)
GFR, Estimated: >60 mL/min (ref >60)
Glucose, Bld: 93 mg/dL (ref 70–99)
Potassium: 3.7 mmol/L (ref 3.5–5.1)
Sodium: 138 mmol/L (ref 135–145)
Total Bilirubin: 0.4 mg/dL (ref 0.3–1.2)
Total Protein: 7.4 g/dL (ref 6.5–8.1)

## 2021-06-25 LAB — CBC WITH DIFFERENTIAL/PLATELET
Abs Immature Granulocytes: 0.05 10*3/uL (ref 0.00–0.07)
Basophils Absolute: 0 10*3/uL (ref 0.0–0.1)
Basophils Relative: 0 %
Eosinophils Absolute: 0.1 10*3/uL (ref 0.0–0.5)
Eosinophils Relative: 1 %
HCT: 38 % — ABNORMAL LOW (ref 39.0–52.0)
Hemoglobin: 12.2 g/dL — ABNORMAL LOW (ref 13.0–17.0)
Immature Granulocytes: 1 %
Lymphocytes Relative: 16 %
Lymphs Abs: 1.7 10*3/uL (ref 0.7–4.0)
MCH: 29.5 pg (ref 26.0–34.0)
MCHC: 32.1 g/dL (ref 30.0–36.0)
MCV: 91.8 fL (ref 80.0–100.0)
Monocytes Absolute: 0.7 10*3/uL (ref 0.1–1.0)
Monocytes Relative: 6 %
Neutro Abs: 7.9 10*3/uL — ABNORMAL HIGH (ref 1.7–7.7)
Neutrophils Relative %: 76 %
Platelets: 151 10*3/uL (ref 150–400)
RBC: 4.14 MIL/uL — ABNORMAL LOW (ref 4.22–5.81)
RDW: 13.3 % (ref 11.5–15.5)
WBC: 10.4 10*3/uL (ref 4.0–10.5)
nRBC: 0 % (ref 0.0–0.2)

## 2021-06-25 LAB — LACTIC ACID, PLASMA: Lactic Acid, Venous: 0.8 mmol/L (ref 0.5–1.9)

## 2021-06-25 MED ORDER — LACTATED RINGERS IV SOLN: INTRAVENOUS | Status: DC

## 2021-06-25 MED ORDER — SODIUM CHLORIDE 0.9 % IV SOLN
2.0000 g | Freq: Once | INTRAVENOUS | Status: AC
  Administered 2021-06-25: 2 g via INTRAVENOUS
  Filled 2021-06-25: qty 12.5

## 2021-06-25 MED ORDER — VANCOMYCIN HCL IN DEXTROSE 1-5 GM/200ML-% IV SOLN
1000.0000 mg | Freq: Once | INTRAVENOUS | Status: AC
  Administered 2021-06-25: 1000 mg via INTRAVENOUS
  Filled 2021-06-25: qty 200

## 2021-06-25 MED ORDER — VANCOMYCIN HCL 1250 MG/250ML IV SOLN
1250.0000 mg | Freq: Two times a day (BID) | INTRAVENOUS | Status: DC
  Administered 2021-06-26 – 2021-06-28 (×5): 1250 mg via INTRAVENOUS
  Filled 2021-06-25 (×6): qty 250

## 2021-06-25 MED ORDER — LACTATED RINGERS IV BOLUS (SEPSIS)
1000.0000 mL | Freq: Once | INTRAVENOUS | Status: AC
  Administered 2021-06-25: 1000 mL via INTRAVENOUS

## 2021-06-25 MED ORDER — MIDAZOLAM HCL 2 MG/ML PO SYRP
10.0000 mg | ORAL_SOLUTION | Freq: Once | ORAL | Status: AC
Start: 2021-06-25 — End: 2021-06-25
  Administered 2021-06-25: 10 mg via ORAL
  Filled 2021-06-25: qty 6

## 2021-06-25 MED ORDER — SODIUM CHLORIDE 0.9 % IV SOLN
2.0000 g | Freq: Three times a day (TID) | INTRAVENOUS | Status: DC
  Administered 2021-06-25 – 2021-06-28 (×8): 2 g via INTRAVENOUS
  Filled 2021-06-25 (×9): qty 12.5

## 2021-06-25 MED ORDER — LIDOCAINE HCL (PF) 1 % IJ SOLN
10.0000 mL | Freq: Once | INTRAMUSCULAR | Status: AC
  Administered 2021-06-25: 10 mL via INTRADERMAL
  Filled 2021-06-25: qty 10

## 2021-06-25 MED ORDER — VANCOMYCIN HCL 500 MG IV SOLR
500.0000 mg | Freq: Once | INTRAVENOUS | Status: AC
  Administered 2021-06-25: 500 mg via INTRAVENOUS

## 2021-06-25 NOTE — ED Notes (Signed)
Dr Dykstra in room w/pt now. °

## 2021-06-25 NOTE — H&P (Signed)
?History and Physical  ? ? ?Patient: Jack [name] DOB: [birth year] ?DOA: 06/25/2021 ?DOS: the patient was seen and examined on 06/26/2021 ?PCP: Loura Back, NP  ?Patient coming from: Home ? ?Chief Complaint:  ?Chief Complaint  ?Patient presents with  ? Abscess  ? ?HPI: Jack [name] is a 36 y.o. male with medical history significant of developmental delay, schizoaffective disorder, seizures who reportedly presents from group home with lethargy and new abscess. ? ?History is limited due to patient's intellectual disability. ?Patient unclear how long he has had a groin abscess but thinks perhaps it was around 3 weeks.  He denies any nausea, vomiting or abdominal pain.  Reportedly mother found him to be more lethargic today at group home and presented to PCP where he was found to have redness and swelling to his right groin.  He was given a dose of IV Rocephin and sent to ED for further eval.   ? ?He was noted to have a moderate-sized abscess to the right groin/inner thigh region.  CT pelvis showed right proximal scrotum abscess.This was successfully I&D needed in the ED and sent for wound cultures.  CBC showed no leukocytosis.  CMP otherwise unremarkable.  He was started on IV vancomycin and cefepime.  Hospitalist then called for admission for continued management of cellulitis. ? ?Of note, he was recently admitted back in beginning of February for right groin cellulitis, gram-negative rod UTI a and presumed chronic prostatitis requiring 3 weeks of Doxy and 4 weeks of Levaquin. ? ?Review of Systems: unable to review all systems due to the inability of the patient to answer questions. ?Past Medical History:  ?Diagnosis Date  ? Bipolar 1 disorder (HCC)   ? Intellectual disability   ? Mental retardation   ? Schizophrenia, schizo-affective (HCC)   ? Seizures (HCC)   ? ?Past Surgical History:  ?Procedure Laterality Date  ? APPENDECTOMY    ? TOOTH EXTRACTION N/A 03/12/2021  ? Procedure: DENTAL  RESTORATION/EXTRACTIONS;  Surgeon: Ocie Doyne, DMD;  Location: [facility] OR;  Service: Oral Surgery;  Laterality: N/A;  ? ?Social History:  reports that he has never smoked. He has never been exposed to tobacco smoke. He has never used smokeless tobacco. He reports that he does not drink alcohol and does not use drugs. ? ?Allergies  ?Allergen Reactions  ? Iodinated Contrast Media Other (See Comments)  ?  Reaction unknown--per [facility].    ? Latex Rash  ? Shellfish-Derived Products Rash  ? ? ?No family history on file. ? ?Prior to Admission medications   ?Medication Sig Start Date End Date Taking? Authorizing Provider  ?cloZAPine (CLOZARIL) 100 MG tablet Take 1 tablet (100 mg total) by mouth 2 (two) times daily. For mood control ?Patient taking differently: Take 100-350 mg by mouth See admin instructions. Take 100 mg in the morning and afternoon and 350 mg at bedtime 02/20/17   Armandina Stammer I, NP  ?divalproex (DEPAKOTE) 500 MG DR tablet Take 4 tablets (2,000 mg total) by mouth daily. For mood stabilization/seizures 02/20/17   Armandina Stammer I, NP  ?gabapentin (NEURONTIN) 300 MG capsule Take 1 capsule (300 mg total) by mouth 3 (three) times daily. For agitation 02/20/17   Armandina Stammer I, NP  ?hydrOXYzine (VISTARIL) 50 MG capsule Take 50 mg by mouth 4 (four) times daily. 03/30/21   [provider]  ?lithium carbonate (ESKALITH) 450 MG CR tablet Take 2 tablets (900 mg total) by mouth daily. For mood stabilization 02/20/17   Armandina Stammer  I, NP  ?magnesium oxide (MAG-OX) 400 (241.3 Mg) MG tablet Take 1 tablet (400 mg total) by mouth daily. For constipation 02/21/17   Sanjuana Kava, NP  ? ? ?Physical Exam: ?Vitals:  ? 06/25/21 1500 06/25/21 1630 06/25/21 1823 06/25/21 2121  ?BP: 129/86 126/80 (!) 135/96 125/88  ?Pulse: 90  84 80  ?Resp: 16  17 18   ?Temp:    97.8 ?F (36.6 ?C)  ?TempSrc:    Oral  ?SpO2: 100%  100% 100%  ?Weight:      ?Height:      ? ?Constitutional: NAD, calm, comfortable, young male laying in  bed ?Eyes: PERRL, lids and conjunctivae normal ?ENMT: Mucous membranes are moist.  ?Neck: normal, suppl ?Respiratory: clear to auscultation bilaterally, no wheezing, no crackles. Normal respiratory effort. No accessory muscle use.  ?Cardiovascular: Regular rate and rhythm, no murmurs / rubs / gallops. No extremity edema. ?Abdomen: no tenderness, Bowel sounds positive.  ?GU: small incision to medial right thigh proximal to groin, superficial skin wound to right scrotum with spreading erythema proximally. Pt covered up penile region and declined exam.  ?Musculoskeletal: no clubbing / cyanosis. No joint deformity upper and lower extremities. Good ROM, no contractures. Normal muscle tone.  ?Skin: no rashes, lesions, ulcers.  ?Neurologic: CN 2-12 grossly intact.  Strength 5/5 in all 4.  ?Psychiatric: Alert and oriented only to self, though he was in [place]. Normal mood.  ?Data Reviewed: ? ?See HPI ? ?Assessment and Plan: ?Abscess ?Right groin abscess with surrounding cellulitis.  Successfully I&D in ED with wound culture pending ?-Continue IV vancomycin and cefepime ?-Wound care daily per RN ? ?Intellectual disability ?Oriented only to self but able to answer questions appropriately.  Lives in group home. ? ?Schizoaffective disorder, bipolar type (HCC) ?- Continue home hydroxyzine, lithium, Depakote and gabapentin ? ? ? ? ? Advance Care Planning:   Code Status: Full Code  ? ?Consults: none ? ?Family Communication: no family at bedside ? ?Severity of Illness: ?The appropriate patient status for this patient is OBSERVATION. Observation status is judged to be reasonable and necessary in order to provide the required intensity of service to ensure the patient's safety. The patient's presenting symptoms, physical exam findings, and initial radiographic and laboratory data in the context of their medical condition is felt to place them at decreased risk for further clinical deterioration. Furthermore, it is anticipated  that the patient will be medically stable for discharge from the hospital within 2 midnights of admission.  ? ?Author: [place], DO ?06/26/2021 12:09 AM ? ?For on call review [email or web address].  ?

## 2021-06-25 NOTE — ED Notes (Signed)
Handoff report given to [facility] RN on 3E at [facility] ?

## 2021-06-25 NOTE — ED Triage Notes (Signed)
C/O boil in the groin area and was given 1 g of rocephin at the primary care clinic prior to arrival; accompanied by mother. Reported lives at group home and noticed he is more confused than usual.  ?

## 2021-06-25 NOTE — ED Provider Notes (Signed)
?[facility] DEPT ?Provider Note ? ? ?CSN: [id number] ?Arrival date & time: 06/25/21  1144 ? ?  ? ?History ? ?Chief Complaint  ?Patient presents with  ? Abscess  ? ? ?Jack [name] is a 36 y.o. male.  Presents to ER due to concern for lethargy, abscess.  Mother reports she had checked on him on Monday at his group home and he seemed to be doing well with time.  Today when she checked on him she noticed that he was much more lethargic than normal.  She states that he acted this way when he had a boil in his groin last time.  Checked today and noticed redness and swelling to his right groin.  She went to primary care, given 1 g of Rocephin and sent to ER for further eval. ? ?History is limited due to patient's intellectual disability.  Has history of schizoaffective disorder, intellectual disability, recurrent boils in right groin. ? ?Reviewed last discharge summary.  Admitted in February 2023.  Admitted for right groin cellulitis, gram-negative rod UTI, chronic prostatitis. ? ?HPI ? ?  ? ?Home Medications ?Prior to Admission medications   ?Medication Sig Start Date End Date Taking? Authorizing Provider  ?cloZAPine (CLOZARIL) 100 MG tablet Take 1 tablet (100 mg total) by mouth 2 (two) times daily. For mood control ?Patient taking differently: Take 100-350 mg by mouth See admin instructions. Take 100 mg in the morning and afternoon and 350 mg at bedtime 02/20/17   Lindell Spar I, NP  ?divalproex (DEPAKOTE) 500 MG DR tablet Take 4 tablets (2,000 mg total) by mouth daily. For mood stabilization/seizures 02/20/17   Lindell Spar I, NP  ?gabapentin (NEURONTIN) 300 MG capsule Take 1 capsule (300 mg total) by mouth 3 (three) times daily. For agitation 02/20/17   Lindell Spar I, NP  ?hydrOXYzine (VISTARIL) 50 MG capsule Take 50 mg by mouth 4 (four) times daily. 03/30/21   [provider]  ?lithium carbonate (ESKALITH) 450 MG CR tablet Take 2 tablets (900 mg total) by mouth daily. For mood  stabilization 02/20/17   Lindell Spar I, NP  ?magnesium oxide (MAG-OX) 400 (241.3 Mg) MG tablet Take 1 tablet (400 mg total) by mouth daily. For constipation 02/21/17   Encarnacion Slates, NP  ?   ? ?Allergies    ?Iodinated contrast media, Latex, and Shellfish-derived products   ? ?Review of Systems   ?Review of Systems  ?Unable to perform ROS: Patient nonverbal  ? ?Physical Exam ?Updated Vital Signs ?BP (!) 141/95   Pulse 92   Temp 98.2 ?F (36.8 ?C) (Oral)   Resp 14   Ht 5\' 11"  (1.803 m)   Wt 79.8 kg   SpO2 98%   BMI 24.55 kg/m?  ?Physical Exam ?Vitals and nursing note reviewed.  ?Constitutional:   ?   General: He is not in acute distress. ?   Appearance: He is well-developed.  ?   Comments: Somewhat lethargic, answers basic questions  ?HENT:  ?   Head: Normocephalic and atraumatic.  ?Eyes:  ?   Conjunctiva/sclera: Conjunctivae normal.  ?Cardiovascular:  ?   Rate and Rhythm: Normal rate and regular rhythm.  ?   Heart sounds: No murmur heard. ?Pulmonary:  ?   Effort: Pulmonary effort is normal. No respiratory distress.  ?   Breath sounds: Normal breath sounds.  ?Abdominal:  ?   Palpations: Abdomen is soft.  ?   Tenderness: There is no abdominal tenderness.  ?Genitourinary: ?   Comments: There is  about a 3 cm area of swelling and induration to the right groin, there is about a 10 cm diameter area of overlying erythema across his right groin extending to the base of his right scrotum, there is no erythema or tenderness to the perineal region, no swelling of the scrotum or penis ?Musculoskeletal:     ?   General: No swelling.  ?   Cervical back: Neck supple.  ?Skin: ?   General: Skin is warm and dry.  ?   Capillary Refill: Capillary refill takes less than 2 seconds.  ?Psychiatric:     ?   Mood and Affect: Mood normal.  ? ?  ?Media Information ?Document Information ? ?Photos  ?Right groin  ?06/25/2021 12:13  ?Attached To:  ?Hospital Encounter on 06/25/21  ? ?Source Information ? ?Lucrezia Starch, MD  [facility]  ? ? ? ?ED Results / Procedures / Treatments   ?Labs ?(all labs ordered are listed, but only abnormal results are displayed) ?Labs Reviewed  ?CBC WITH DIFFERENTIAL/PLATELET - Abnormal; Notable for the following components:  ?    Result Value  ? RBC 4.14 (*)   ? Hemoglobin 12.2 (*)   ? HCT 38.0 (*)   ? Neutro Abs 7.9 (*)   ? All other components within normal limits  ?COMPREHENSIVE METABOLIC PANEL - Abnormal; Notable for the following components:  ? AST 9 (*)   ? All other components within normal limits  ?CULTURE, BLOOD (ROUTINE X 2)  ?CULTURE, BLOOD (ROUTINE X 2)  ?AEROBIC/ANAEROBIC CULTURE W GRAM STAIN (SURGICAL/DEEP WOUND)  ?LACTIC ACID, PLASMA  ? ? ?EKG ?None ? ?Radiology ?CT PELVIS WO CONTRAST ? ?Result Date: 06/25/2021 ?CLINICAL DATA:  Swelling, necrotizing soft tissue infection at the right groin which started as a boil, evaluate for abscess EXAM: CT PELVIS WITHOUT CONTRAST TECHNIQUE: Multidetector CT imaging of the pelvis was performed following the standard protocol without intravenous contrast. RADIATION DOSE REDUCTION: This exam was performed according to the departmental dose-optimization program which includes automated exposure control, adjustment of the mA and/or kV according to patient size and/or use of iterative reconstruction technique. COMPARISON:  CT of the abdomen and pelvis dated April 07, 2021; however, this study did not include the scrotum FINDINGS: Urinary Tract: Urinary bladder and prostate have a normal appearance. Bowel: Bowel gas pattern is nonobstructive. Moderate stool in the colon. Vascular/Lymphatic: Vessels are unremarkable. Reproductive: Visualized penile shaft, testes and prostate have a normal appearance. Other: There is 3.3 x 2.7 x 3.3 cm area of hyperdensity with areas of hypodensity within it seen at the right proximal aspect of the scrotum representing a boil turning into an abscess. There is subjacent inflammatory stranding seen cellulitis at the proximal  scrotum. There are a few bilateral inguinal lymph nodes seewith the largest lymph node at the right side measuring 1 cm in the short axis. Musculoskeletal: Normal IMPRESSION: 3.3 x 2.7 x 3.3 cm area of hyperdensity with areas of hypo attenuation within it at the right proximal scrotum likely represents a cutaneous boil turning into an abscess. Electronically Signed   By: Frazier Richards M.D.   On: 06/25/2021 12:56   ? ?Procedures ?Marland Kitchen.Incision and Drainage ? ?Date/Time: 06/25/2021 2:59 PM ?Performed by: Lucrezia Starch, MD ?Authorized by: Lucrezia Starch, MD  ? ?Consent:  ?  Consent obtained:  Verbal ?  Consent given by:  Patient and guardian ?  Risks, benefits, and alternatives were discussed: yes   ?  Risks discussed:  Bleeding, damage to other  organs, infection, incomplete drainage and pain ?  Alternatives discussed:  No treatment, delayed treatment, alternative treatment, observation and referral ?Universal protocol:  ?  Immediately prior to procedure, a time out was called: yes   ?  Patient identity confirmed:  Verbally with patient and arm band ?Location:  ?  Type:  Abscess ?  Size:  3 cm ?  Location:  Anogenital ?  Anogenital location: right inguinal fold. ?Pre-procedure details:  ?  Skin preparation:  Povidone-iodine ?Sedation:  ?  Sedation type:  Anxiolysis ?Anesthesia:  ?  Anesthesia method:  Local infiltration ?  Local anesthetic:  Lidocaine 1% w/o epi ?Procedure type:  ?  Complexity:  Complex ?Procedure details:  ?  Ultrasound guidance: no   ?  Needle aspiration: no   ?  Incision types:  Single straight ?  Wound management:  Probed and deloculated and extensive cleaning ?  Drainage:  Purulent ?  Drainage amount:  Copious ?  Wound treatment:  Wound left open ?  Packing materials:  None ?Post-procedure details:  ?  Procedure completion:  Tolerated well, no immediate complications  ? ? ?Medications Ordered in ED ?Medications  ?lactated ringers infusion ( Intravenous New Bag/Given 06/25/21 1400)  ?vancomycin  (VANCOCIN) 500 mg in sodium chloride 0.9 % 100 mL IVPB (500 mg Intravenous New Bag/Given 06/25/21 1442)  ?ceFEPIme (MAXIPIME) 2 g in sodium chloride 0.9 % 100 mL IVPB (has no administration in time range)  ?vancomycin Veleta Miners

## 2021-06-25 NOTE — Progress Notes (Signed)
Pharmacy Antibiotic Note ? ?Jack [name] is a 36 y.o. male admitted on 06/25/2021 with sepsis.  Pharmacy has been consulted for vancomycin and cefepime dosing. Pt is afebrile and WBC is WNL. SCr is WNL. ? ?Plan: ?Cefepime 2g IV Q8H  ?Vanc 1500mg  IV x 1 then 1250mg  IV Q12H ?F/u renal fxn, C&S, clinical status and peak.trough at [facility] ? ?Height: 5\' 11"  (180.3 cm) ?Weight: 79.8 kg (176 lb) ?IBW/kg (Calculated) : 75.3 ? ?Temp (24hrs), Avg:98.2 ?F (36.8 ?C), Min:98.2 ?F (36.8 ?C), Max:98.2 ?F (36.8 ?C) ? ?Recent Labs  ?Lab 06/25/21 ?1211  ?WBC 10.4  ?  ?CrCl cannot be calculated (Patient's most recent lab result is older than the maximum 21 days allowed.).   ? ?Allergies  ?Allergen Reactions  ? Iodinated Contrast Media Other (See Comments)  ?  Reaction unknown--per [facility].    ? Latex Rash  ? Shellfish-Derived Products Rash  ? ? ?Antimicrobials this admission: ?Vanc 4/21>> ?Cefepime 4/21>> ? ?Dose adjustments this admission: ?N/A ? ?Microbiology results: ?Pending ? ?Thank you for allowing pharmacy to be a part of this patient?s care. ? ?Lonette Stevison, ?06/25/2021 12:38 PM ? ?

## 2021-06-26 DIAGNOSIS — D696 Thrombocytopenia, unspecified: Secondary | ICD-10-CM

## 2021-06-26 DIAGNOSIS — D649 Anemia, unspecified: Secondary | ICD-10-CM

## 2021-06-26 DIAGNOSIS — F250 Schizoaffective disorder, bipolar type: Secondary | ICD-10-CM | POA: Diagnosis present

## 2021-06-26 DIAGNOSIS — L03818 Cellulitis of other sites: Secondary | ICD-10-CM | POA: Diagnosis not present

## 2021-06-26 DIAGNOSIS — Z79899 Other long term (current) drug therapy: Secondary | ICD-10-CM | POA: Diagnosis not present

## 2021-06-26 DIAGNOSIS — F79 Unspecified intellectual disabilities: Secondary | ICD-10-CM | POA: Diagnosis present

## 2021-06-26 DIAGNOSIS — N492 Inflammatory disorders of scrotum: Secondary | ICD-10-CM | POA: Diagnosis present

## 2021-06-26 DIAGNOSIS — K5900 Constipation, unspecified: Secondary | ICD-10-CM | POA: Diagnosis present

## 2021-06-26 DIAGNOSIS — L02214 Cutaneous abscess of groin: Secondary | ICD-10-CM | POA: Diagnosis present

## 2021-06-26 DIAGNOSIS — F419 Anxiety disorder, unspecified: Secondary | ICD-10-CM | POA: Diagnosis present

## 2021-06-26 DIAGNOSIS — F840 Autistic disorder: Secondary | ICD-10-CM | POA: Diagnosis present

## 2021-06-26 DIAGNOSIS — D6959 Other secondary thrombocytopenia: Secondary | ICD-10-CM | POA: Diagnosis present

## 2021-06-26 DIAGNOSIS — L03314 Cellulitis of groin: Secondary | ICD-10-CM | POA: Diagnosis present

## 2021-06-26 DIAGNOSIS — L0390 Cellulitis, unspecified: Secondary | ICD-10-CM | POA: Diagnosis present

## 2021-06-26 LAB — CBC
HCT: 34.5 % — ABNORMAL LOW (ref 39.0–52.0)
Hemoglobin: 11.4 g/dL — ABNORMAL LOW (ref 13.0–17.0)
MCH: 30.5 pg (ref 26.0–34.0)
MCHC: 33 g/dL (ref 30.0–36.0)
MCV: 92.2 fL (ref 80.0–100.0)
Platelets: 147 10*3/uL — ABNORMAL LOW (ref 150–400)
RBC: 3.74 MIL/uL — ABNORMAL LOW (ref 4.22–5.81)
RDW: 13.2 % (ref 11.5–15.5)
WBC: 7.4 10*3/uL (ref 4.0–10.5)
nRBC: 0 % (ref 0.0–0.2)

## 2021-06-26 LAB — MRSA NEXT GEN BY PCR, NASAL: MRSA by PCR Next Gen: NOT DETECTED

## 2021-06-26 MED ORDER — DIVALPROEX SODIUM 500 MG PO DR TAB
2000.0000 mg | DELAYED_RELEASE_TABLET | Freq: Every day | ORAL | Status: DC
  Administered 2021-06-26 – 2021-06-27 (×2): 2000 mg via ORAL
  Filled 2021-06-26 (×3): qty 4
  Filled 2021-06-26: qty 8

## 2021-06-26 MED ORDER — LITHIUM CARBONATE ER 450 MG PO TBCR
900.0000 mg | EXTENDED_RELEASE_TABLET | Freq: Every day | ORAL | Status: DC
  Administered 2021-06-26 – 2021-06-27 (×2): 900 mg via ORAL
  Filled 2021-06-26 (×3): qty 2

## 2021-06-26 MED ORDER — CLOZAPINE 100 MG PO TABS
100.0000 mg | ORAL_TABLET | Freq: Two times a day (BID) | ORAL | Status: DC
Start: 2021-06-26 — End: 2021-06-28
  Administered 2021-06-26 – 2021-06-28 (×5): 100 mg via ORAL
  Filled 2021-06-26 (×5): qty 1

## 2021-06-26 MED ORDER — CLOZAPINE 25 MG PO TABS
350.0000 mg | ORAL_TABLET | Freq: Every day | ORAL | Status: DC
  Administered 2021-06-26 – 2021-06-27 (×2): 350 mg via ORAL
  Filled 2021-06-26 (×2): qty 2

## 2021-06-26 MED ORDER — GABAPENTIN 300 MG PO CAPS
300.0000 mg | ORAL_CAPSULE | Freq: Three times a day (TID) | ORAL | Status: DC
  Administered 2021-06-26 – 2021-06-28 (×7): 300 mg via ORAL
  Filled 2021-06-26 (×7): qty 1

## 2021-06-26 MED ORDER — HYDROXYZINE HCL 50 MG PO TABS
50.0000 mg | ORAL_TABLET | Freq: Four times a day (QID) | ORAL | Status: DC
  Administered 2021-06-26 – 2021-06-28 (×9): 50 mg via ORAL
  Filled 2021-06-26 (×10): qty 1

## 2021-06-26 NOTE — Assessment & Plan Note (Signed)
Right groin abscess with surrounding cellulitis.  Successfully I&D in ED with wound culture pending ?-Continue IV vancomycin and cefepime ?-Wound care daily per RN ?

## 2021-06-26 NOTE — Hospital Course (Addendum)
36 yom, w/ developmental delay, intellectual disability, schizoaffective disorder, seizures from group home  presented with lethargy and new abscess. 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 He was sent to PCP where he was found to have redness and swelling to his right groin given a dose of IV Rocephin and sent to ED for further eval.   ?In ED-  had a moderate-sized abscess to the right groin/inner thigh region. CT pelvis showed right proximal scrotum abscess, successfully I&D done in the ED and sent for wound cultures.  CBC showed no leukocytosis.  CMP otherwise unremarkable. started on IV vancomycin and cefepime and admitted for further management. ?

## 2021-06-26 NOTE — Assessment & Plan Note (Signed)
Oriented only to self but able to answer questions appropriately.  Lives in group home. ?

## 2021-06-26 NOTE — Progress Notes (Signed)
?PROGRESS NOTE ?Jack [name]  [id number] DOB: [birth year] DOA: 06/25/2021 ?PCP: Arthur Holms, NP  ? ?Brief Narrative/Hospital Course: ?70 yom, w/ developmental delay, intellectual disability, schizoaffective disorder, seizures from group home  presented with lethargy and new abscess. 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 He was sent to PCP where he was found to have redness and swelling to his right groin given a dose of IV Rocephin and sent to ED for further eval.   ?In ED-  had a moderate-sized abscess to the right groin/inner thigh region. CT pelvis showed right proximal scrotum abscess, successfully I&D done in the ED and sent for wound cultures.  CBC showed no leukocytosis.  CMP otherwise unremarkable. started on IV vancomycin and cefepime and admitted for further management. ?  ?  ?Subjective: ?Seen and examined this morning ?Reports some improvement in pain, right groin is still painful and tender.  He is ambulating in the room. ? ?Assessment and Plan: ?Principal Problem: ?  Cellulitis ?Active Problems: ?  Abscess ?  Schizoaffective disorder, bipolar type ([place]) ?  Intellectual disability ?  Anxiety ?  Autism ?  Constipation ?  Normocytic anemia ?  Thrombocytopenia ([place]) ? ?Right groin abscess and Cellulitis: ?I&D done in the ED. Gram stain- GPR, GPC in chains.  Continue current vancomycin, cefepime, follow-up culture data.  Continue pain control. ? ?Mild thrombocytopenia likely from abscess.  Monitor ? ?Schizoaffective disorder, bipolar type ?Intellectual disability ?Anxiety ?Autism ?Group home resident: ?Supportive care, cont w/ home medications Clozaril,lithium atarax, Neurontin Depakote ? ?Constipation:prn laxatives. ?Normocytic anemia: Monitor ? ?DVT prophylaxis: SCDs Start: 06/25/21 2351 ?Code Status:   Code Status: Full Code ?Family Communication: plan of care discussed  with patient at bedside. ?Patient status is: Inpatient level of care: Med-Surg  ?Remains inpatient because: Ongoing management of cellulitis ?Patient currently not stable ? ?Dispo: The patient is from: Group home ?           Anticipated disposition: Group home in 2 to 3 days ? ?Mobility Assessment (last 72 hours)   ? ? Mobility Assessment   ? ? [place] Name 06/25/21 2100  ?  ?  ?  ?  ? Does patient have an order for bedrest or is patient medically unstable No - Continue assessment      ? What is the highest level of mobility based on the progressive mobility assessment? Level 6 (Walks independently in room and hall) - Balance while walking in room without assist - Complete      ? ?  ?  ? ?  ?  ? ?Objective: ?Vitals last 24 hrs: ?Vitals:  ? 06/25/21 2121 06/26/21 0138 06/26/21 0459 06/26/21 1030  ?BP: 125/88 102/72 102/68 124/86  ?Pulse: 80 85 81 99  ?Resp: 18 16 16 18   ?Temp: 97.8 ?F (36.6 ?C) 98 ?F (36.7 ?C) 98.2 ?F (36.8 ?C) 98 ?F (36.7 ?C)  ?TempSrc: Oral Oral Oral Oral  ?SpO2: 100% 100% 100% 100%  ?Weight:      ?Height:      ? ?Weight change:  ? ?Physical Examination: ?General exam: AA oriented with baseline intellectual disability. ?HEENT:Oral mucosa moist, Ear/Nose WNL grossly, dentition normal. ?Respiratory system: bilaterally diminished BS, no use of accessory muscle ?Cardiovascular system: S1 & S2 +, No JVD,. ?Gastrointestinal system: Abdomen soft,NT,ND, BS+ ?Nervous System:Alert, awake, moving extremities and grossly nonfocal ?Extremities: LE edema none,distal peripheral pulses  palpable.  ?Skin: No rashes,no icterus. ?MSK: Normal muscle bulk,tone, power ?Right groin area with some skin ulceration, induration, erythema tenderness present ? ?Medications reviewed: Scheduled Meds: ? cloZAPine  100-350 mg Oral See admin instructions  ? divalproex  2,000 mg Oral Daily  ? gabapentin  300 mg Oral TID  ? hydrOXYzine  50 mg Oral QID  ? lithium carbonate  900 mg Oral Daily  ? ?Continuous Infusions: ? ceFEPime (MAXIPIME)  IV 2 g (06/26/21 0602)  ? vancomycin 1,250 mg (06/26/21 0429)  ? ? ?  ?Diet Order   ? ?       ?  Diet regular Room service appropriate? Yes; Fluid consistency: Thin  Diet effective now       ?  ? ?  ?  ? ?  ?  ? ?  ?  ?  ? ? ?Intake/Output Summary (Last 24 hours) at 06/26/2021 1106 ?Last data filed at 06/26/2021 [id number] ?Gross per 24 hour  ?Intake 1659.73 ml  ?Output 400 ml  ?Net 1259.73 ml  ? ?Net IO Since Admission: 1,259.73 mL [06/26/21 1106]  ?Wt Readings from Last 3 Encounters:  ?07-22-21 79.8 kg  ?04/06/21 77.1 kg  ?03/12/21 78 kg  ?  ? ?Unresulted Labs (From admission, onward)  ? ?  Start     Ordered  ? 06/27/21 [id number]  Basic metabolic panel  Every 48 hours,   R     ? 06/26/21 0745  ? ?  ?  ? ?  ?Data Reviewed: I have personally reviewed following labs and imaging studies ?CBC: ?Recent Labs  ?Lab 07-22-2021 ?1211 06/26/21 ?[id number]  ?WBC 10.4 7.4  ?NEUTROABS 7.9*  --   ?HGB 12.2* 11.4*  ?HCT 38.0* 34.5*  ?MCV 91.8 92.2  ?PLT 151 147*  ? ?Basic Metabolic Panel: ?Recent Labs  ?Lab 22-Jul-2021 ?1211  ?NA 138  ?K 3.7  ?CL 103  ?CO2 28  ?GLUCOSE 93  ?BUN 6  ?CREATININE 1.03  ?CALCIUM 10.0  ? ?GFR: ?Estimated Creatinine Clearance: 107.6 mL/min (by C-G formula based on SCr of 1.03 mg/dL). ?Liver Function Tests: ?Recent Labs  ?Lab 07/22/21 ?1211  ?AST 9*  ?ALT 5  ?ALKPHOS 57  ?BILITOT 0.4  ?PROT 7.4  ?ALBUMIN 4.2  ? ?No results for input(s): LIPASE, AMYLASE in the last 168 hours. ?No results for input(s): AMMONIA in the last 168 hours. ?Coagulation Profile: ?No results for input(s): INR, PROTIME in the last 168 hours. ?BNP (last 3 results) ?No results for input(s): PROBNP in the last 8760 hours. ?HbA1C: ?No results for input(s): HGBA1C in the last 72 hours. ?CBG: ?No results for input(s): GLUCAP in the last 168 hours. ?Lipid Profile: ?No results for input(s): CHOL, HDL, LDLCALC, TRIG, CHOLHDL, LDLDIRECT in the last 72 hours. ?Thyroid Function Tests: ?No results for input(s): TSH, T4TOTAL, FREET4, T3FREE, THYROIDAB in the last 72  hours. ?Sepsis Labs: ?Recent Labs  ?Lab 07/22/2021 ?1230  ?LATICACIDVEN 0.8  ? ? ?Recent Results (from the past 240 hour(s))  ?Blood culture (routine x 2)     Status: None (Preliminary result)  ? Collection Time: 2021-07-22 12:05 PM  ? Specimen: Right Antecubital; Blood  ?Result Value Ref Range Status  ? Specimen Description   Final  ?  RIGHT ANTECUBITAL ?Performed at [facility], [address], [place], [place] [address] ?  ? Special Requests   Final  ?  BOTTLES DRAWN AEROBIC ONLY Blood Culture adequate volume ?Performed at [facility], [address], [place], [place] [address] ?  ?  Culture   Final  ?  NO GROWTH < 24 HOURS ?Performed at [facility], [place] [address] [place], [place] [address] ?  ? Report Status PENDING  Incomplete  ?Blood culture (routine x 2)     Status: None (Preliminary result)  ? Collection Time: 06/25/21 12:48 PM  ? Specimen: BLOOD LEFT FOREARM  ?Result Value Ref Range Status  ? Specimen Description   Final  ?  BLOOD LEFT FOREARM ?Performed at [facility], [address], [place], [place] [address] ?  ? Special Requests   Final  ?  BOTTLES DRAWN AEROBIC ONLY Blood Culture adequate volume  ? Culture   Final  ?  NO GROWTH < 24 HOURS ?Performed at [facility], [place] [address], [place], [place] [address] ?  ? Report Status PENDING  Incomplete  ?Aerobic/Anaerobic Culture w Gram Stain (surgical/deep wound)     Status: None (Preliminary result)  ? Collection Time: 06/25/21  6:59 PM  ? Specimen: Groin  ?Result Value Ref Range Status  ? Specimen Description   Final  ?  GROIN ?Performed at [facility], [address], [place], [place] [address] ?  ? Special Requests   Final  ?  NONE ?Performed at [facility], [address] [place], [place] [address] ?  ? Gram Stain   Final  ?  RARE WBC PRESENT, PREDOMINANTLY PMN ?RARE GRAM POSITIVE COCCI IN PAIRS ?RARE GRAM POSITIVE RODS ?Performed at  [facility], [place] [address] [place], [place] [address] ?  ? Culture PENDING  Incomplete  ? Report Status PENDING  Incomplete  ?MRSA Next Gen by PCR, Nasal     Status: None  ? Collection Time: 04/22/2

## 2021-06-26 NOTE — Assessment & Plan Note (Signed)
-   Continue home hydroxyzine, lithium, Depakote and gabapentin ?

## 2021-06-27 DIAGNOSIS — L03818 Cellulitis of other sites: Secondary | ICD-10-CM | POA: Diagnosis not present

## 2021-06-27 LAB — CBC
HCT: 36.8 % — ABNORMAL LOW (ref 39.0–52.0)
Hemoglobin: 11.6 g/dL — ABNORMAL LOW (ref 13.0–17.0)
MCH: 29.4 pg (ref 26.0–34.0)
MCHC: 31.5 g/dL (ref 30.0–36.0)
MCV: 93.4 fL (ref 80.0–100.0)
Platelets: 176 10*3/uL (ref 150–400)
RBC: 3.94 MIL/uL — ABNORMAL LOW (ref 4.22–5.81)
RDW: 13.3 % (ref 11.5–15.5)
WBC: 6.2 10*3/uL (ref 4.0–10.5)
nRBC: 0 % (ref 0.0–0.2)

## 2021-06-27 LAB — BASIC METABOLIC PANEL
Anion gap: 4 — ABNORMAL LOW (ref 5–15)
BUN: 8 mg/dL (ref 6–20)
CO2: 28 mmol/L (ref 22–32)
Calcium: 9.1 mg/dL (ref 8.9–10.3)
Chloride: 109 mmol/L (ref 98–111)
Creatinine, Ser: 0.91 mg/dL (ref 0.61–1.24)
GFR, Estimated: >60 mL/min (ref >60)
Glucose, Bld: 86 mg/dL (ref 70–99)
Potassium: 4.1 mmol/L (ref 3.5–5.1)
Sodium: 141 mmol/L (ref 135–145)

## 2021-06-27 MED ORDER — ACETAMINOPHEN 325 MG PO TABS: 650.0000 mg | ORAL_TABLET | Freq: Four times a day (QID) | ORAL | Status: DC | PRN

## 2021-06-27 MED ORDER — ENOXAPARIN SODIUM 40 MG/0.4ML IJ SOSY
40.0000 mg | PREFILLED_SYRINGE | INTRAMUSCULAR | Status: DC
  Administered 2021-06-27: 40 mg via SUBCUTANEOUS
  Filled 2021-06-27: qty 0.4

## 2021-06-27 MED ORDER — IBUPROFEN 400 MG PO TABS
400.0000 mg | ORAL_TABLET | Freq: Four times a day (QID) | ORAL | Status: DC | PRN
  Administered 2021-06-27 (×2): 400 mg via ORAL
  Filled 2021-06-27 (×2): qty 1

## 2021-06-27 NOTE — TOC Initial Note (Signed)
Transition of Care (TOC) - Initial/Assessment Note  ? ? ?Patient Details  ?Name: Jack [name] ?MRN: [id number] ?Date of Birth: Jan 28, 1986 ? ?Transition of Care (TOC) CM/SW Contact:    ?Cecille Po, RN ?Phone Number: ?06/27/2021, 1:33 PM ? ?Clinical Narrative:                 ? ?Called patient's mother, Neysa Bonito, at [phone]. She states he lives at [facility].  States when he's ready for discharge, she will be the one to pick him up and take him back.  States patient is physically independent, doesn't use any equipment.   ?TOC following.   ? ? ? ?Expected Discharge Plan: Group Home ?Barriers to Discharge: Continued Medical Work up ? ? ? ?Expected Discharge Plan and Services ?Expected Discharge Plan: Group Home ?  ?  ?  ?Living arrangements for the past 2 months: Group Home ?                ?  ?Prior Living Arrangements/Services ?Living arrangements for the past 2 months: Group Home ?Lives with:: Facility Resident ?Patient language and need for interpreter reviewed:: Yes ?Do you feel safe going back to the place where you live?: Yes      ?Need for Family Participation in Patient Care: Yes (Comment) ?Care giver support system in place?: Yes (comment) ?  ?Criminal Activity/Legal Involvement Pertinent to Current Situation/Hospitalization: No - Comment as needed ? ?Activities of Daily Living ?Home Assistive Devices/Equipment: None ?ADL Screening (condition at time of admission) ?Patient's cognitive ability adequate to safely complete daily activities?: No ?Is the patient deaf or have difficulty hearing?: No ?Does the patient have difficulty seeing, even when wearing glasses/contacts?: No ?Does the patient have difficulty concentrating, remembering, or making decisions?: No ?Patient able to express need for assistance with ADLs?: Yes ?Does the patient have difficulty dressing or bathing?: No ?Independently performs ADLs?: Yes (appropriate for developmental age) ?Does the patient have difficulty  walking or climbing stairs?: No ?Weakness of Legs: None ?Weakness of Arms/Hands: None ? ?Emotional Assessment ?  ?Alcohol / Substance Use: Not Applicable ?  ? ?Admission diagnosis:  Cellulitis [L03.90] ?Soft tissue abscess of inguinal region [L02.214] ?Cellulitis of other specified site [L03.818] ?Patient Active Problem List  ? Diagnosis Date Noted  ? Normocytic anemia 06/26/2021  ? Thrombocytopenia (HCC) 06/26/2021  ? Sepsis (HCC)   ? Prostatitis   ? Abscess   ? Cellulitis 04/07/2021  ? Acute metabolic encephalopathy 04/07/2021  ? Urinary tract infection without hematuria   ? Autism 08/24/2017  ? Bipolar affective disorder, manic, severe (HCC) 01/29/2017  ? Agitation 03/09/2015  ? Bipolar 1 disorder, manic, moderate (HCC) 03/08/2015  ? Schizoaffective disorder, bipolar type (HCC) 01/10/2012  ? Intellectual disability 01/10/2012  ? Anxiety 01/10/2012  ? Constipation 10/29/2007  ? ?PCP:  Loura Back, NP ?Pharmacy:   ?[id number] - [address] [place] - [address] ?[address] ?[address] ?[place] [address] ?Phone: [phone] Fax: [phone] ? ? ? ?

## 2021-06-27 NOTE — Progress Notes (Signed)
?PROGRESS NOTE ?Jack [name]  [id number] DOB: May 29, 1985 DOA: 06/25/2021 ?PCP: Arthur Holms, NP  ? ?Brief Narrative/Hospital Course: ?55 yom, w/ developmental delay, intellectual disability, schizoaffective disorder, seizures from group home  presented with lethargy and new abscess. 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 He was sent to PCP where he was found to have redness and swelling to his right groin given a dose of IV Rocephin and sent to ED for further eval.   ?In ED-  had a moderate-sized abscess to the right groin/inner thigh region. CT pelvis showed right proximal scrotum abscess, successfully I&D done in the ED and sent for wound cultures.  CBC showed no leukocytosis.  CMP otherwise unremarkable. started on IV vancomycin and cefepime and admitted for further management. ?   ?Subjective: ?Seen and examined.  Resting comfortably still has pain in the right groin no complaints.  Reports he feels tired. ?Overnight no fever. ?BP soft ? ? ?Assessment and Plan: ?Principal Problem: ?  Cellulitis ?Active Problems: ?  Abscess ?  Schizoaffective disorder, bipolar type ([place]) ?  Intellectual disability ?  Anxiety ?  Autism ?  Constipation ?  Normocytic anemia ?  Thrombocytopenia ([place]) ? ?Right groin abscess and Cellulitis: s/p I&D done in the ED. Gram stain- GPR, GPC in chains, culture pending. For now cont vancomycin, cefepime,wound care, pain control and follow-up culture data.  Still having ongoing pain, difficult examination As he does not allow due to the pain and tenderness-but overall afebrile no leukocytosis.  If persistent can consider CT imaging. ? ?Mild thrombocytopenia likely from abscess. resolved  ? ?Schizoaffective disorder, bipolar type ?Intellectual disability ?Anxiety ?Autism ?Group home resident: ?Mood stable, cont on supportive care, cont w/ home medications  Clozaril,lithium, atarax, Neurontin Depakote ? ?Constipation:prn laxatives. ?Normocytic anemia: stable, monitor ?Recent Labs  ?Lab 06/25/21 ?1211 06/26/21 ?[id number] 06/27/21 ?0450  ?HGB 12.2* 11.4* 11.6*  ?HCT 38.0* 34.5* 36.8*  ?  ?DVT prophylaxis: SCDs Start: 06/25/21 2351 ?Code Status:   Code Status: Full Code ?Family Communication: plan of care discussed with patient at bedside. ?Patient status is: Inpatient level of care: Med-Surg  ?Remains inpatient because: Ongoing management of cellulitis ?Patient currently not stable ? ?Dispo: The patient is from: Group home ?           Anticipated disposition: Group home in 2 to 3 days ? ?Mobility Assessment (last 72 hours)   ? ? Mobility Assessment   ? ? [place] Name 06/26/21 2000 06/25/21 2100  ?  ?  ?  ? Does patient have an order for bedrest or is patient medically unstable No - Continue assessment No - Continue assessment     ? What is the highest level of mobility based on the progressive mobility assessment? Level 6 (Walks independently in room and hall) - Balance while walking in room without assist - Complete Level 6 (Walks independently in room and hall) - Balance while walking in room without assist - Complete     ? ?  ?  ? ?  ?  ? ?Objective: ?Vitals last 24 hrs: ?Vitals:  ? 06/26/21 1451 06/26/21 2046 06/26/21 2047 06/27/21 0523  ?BP: (!) 123/93 99/71  91/64  ?Pulse: 97 89 87 83  ?Resp: 17 18    ?Temp: 97.7 ?F (36.5 ?C) 97.9 ?F (36.6 ?C)  (!) 97.5 ?F (36.4 ?C)  ?TempSrc: Oral Oral  Oral  ?  SpO2: 100% 99% 100% 99%  ?Weight:      ?Height:      ? ?Weight change:  ? ?Physical Examination: ? ?General exam: AA,older than stated age, weak appearing. ?HEENT:Oral mucosa moist, Ear/Nose WNL grossly, dentition normal. ?Respiratory system: bilaterally clear,no use of accessory muscle ?Cardiovascular system: S1 & S2 +, No JVD,. ?Gastrointestinal system: Abdomen soft,NT,ND, BS+ ?Nervous System:Alert, awake, moving extremities and grossly nonfocal ?Extremities: Right groin area of  skin abrasion, right groin tender-Limited exam but no induration felt  ?Skin: No rashes,no icterus. ?MSK: Normal muscle bulk,tone, power ? ? ?Medications reviewed: Scheduled Meds: ? cloZAPine  100 mg Oral BID  ? cloZAPine  350 mg Oral QHS  ? divalproex  2,000 mg Oral Daily  ? gabapentin  300 mg Oral TID  ? hydrOXYzine  50 mg Oral QID  ? lithium carbonate  900 mg Oral Daily  ? ?Continuous Infusions: ? ceFEPime (MAXIPIME) IV 2 g (06/27/21 [id number])  ? vancomycin 1,250 mg (06/27/21 0412)  ? ? ?  ?Diet Order   ? ?       ?  Diet regular Room service appropriate? Yes; Fluid consistency: Thin  Diet effective now       ?  ? ?  ?  ? ?  ?  ? ?  ?  ?  ? ? ?Intake/Output Summary (Last 24 hours) at 06/27/2021 0953 ?Last data filed at 06/27/2021 [id number] ?Gross per 24 hour  ?Intake 1402.73 ml  ?Output --  ?Net 1402.73 ml  ? ?Net IO Since Admission: 2,662.46 mL [06/27/21 0953]  ?Wt Readings from Last 3 Encounters:  ?2021-07-15 79.8 kg  ?04/06/21 77.1 kg  ?03/12/21 78 kg  ?  ? ?Unresulted Labs (From admission, onward)  ? ?  Start     Ordered  ? 06/27/21 [id number]  Basic metabolic panel  Every 48 hours,   R     ? 06/26/21 0745  ? 06/27/21 0500  CBC  Daily,   R     ? 06/26/21 1106  ? ?  ?  ? ?  ?Data Reviewed: I have personally reviewed following labs and imaging studies ?CBC: ?Recent Labs  ?Lab 2021-07-15 ?1211 06/26/21 ?[id number] 06/27/21 ?0450  ?WBC 10.4 7.4 6.2  ?NEUTROABS 7.9*  --   --   ?HGB 12.2* 11.4* 11.6*  ?HCT 38.0* 34.5* 36.8*  ?MCV 91.8 92.2 93.4  ?PLT 151 147* 176  ? ?Basic Metabolic Panel: ?Recent Labs  ?Lab 15-Jul-2021 ?1211 06/27/21 ?0450  ?NA 138 141  ?K 3.7 4.1  ?CL 103 109  ?CO2 28 28  ?GLUCOSE 93 86  ?BUN 6 8  ?CREATININE 1.03 0.91  ?CALCIUM 10.0 9.1  ? ?GFR: ?Estimated Creatinine Clearance: 121.8 mL/min (by C-G formula based on SCr of 0.91 mg/dL). ?Liver Function Tests: ?Recent Labs  ?Lab 2021-07-15 ?1211  ?AST 9*  ?ALT 5  ?ALKPHOS 57  ?BILITOT 0.4  ?PROT 7.4  ?ALBUMIN 4.2  ? ?No results for input(s): LIPASE, AMYLASE in the last 168  hours. ?No results for input(s): AMMONIA in the last 168 hours. ?Coagulation Profile: ?No results for input(s): INR, PROTIME in the last 168 hours. ?BNP (last 3 results) ?No results for input(s): PROBNP in the last 8760 hours. ?HbA1C: ?No results for input(s): HGBA1C in the last 72 hours. ?CBG: ?No results for input(s): GLUCAP in the last 168 hours. ?Lipid Profile: ?No results for input(s): CHOL, HDL, LDLCALC, TRIG, CHOLHDL, LDLDIRECT in the last 72 hours. ?Thyroid Function Tests: ?No results for input(s): TSH, T4TOTAL, FREET4,  T3FREE, THYROIDAB in the last 72 hours. ?Sepsis Labs: ?Recent Labs  ?Lab 06/25/21 ?1230  ?LATICACIDVEN 0.8  ? ? ?Recent Results (from the past 240 hour(s))  ?Blood culture (routine x 2)     Status: None (Preliminary result)  ? Collection Time: 06/25/21 12:05 PM  ? Specimen: Right Antecubital; Blood  ?Result Value Ref Range Status  ? Specimen Description   Final  ?  RIGHT ANTECUBITAL ?Performed at [facility], [address], [place], [place] [address] ?  ? Special Requests   Final  ?  BOTTLES DRAWN AEROBIC ONLY Blood Culture adequate volume ?Performed at [facility], [address], [place], [place] [address] ?  ? Culture   Final  ?  NO GROWTH 2 DAYS ?Performed at [facility], [place] [address], [place], [place] [address] ?  ? Report Status PENDING  Incomplete  ?Blood culture (routine x 2)     Status: None (Preliminary result)  ? Collection Time: 06/25/21 12:48 PM  ? Specimen: BLOOD LEFT FOREARM  ?Result Value Ref Range Status  ? Specimen Description   Final  ?  BLOOD LEFT FOREARM ?Performed at [facility], [address], [place], [place] [address] ?  ? Special Requests   Final  ?  BOTTLES DRAWN AEROBIC ONLY Blood Culture adequate volume  ? Culture   Final  ?  NO GROWTH 2 DAYS ?Performed at [facility], [place] [address] [place], [place] [address] ?  ? Report Status PENDING  Incomplete  ?Aerobic/Anaerobic Culture w Gram  Stain (surgical/deep wound)     Status: None (Preliminary result)  ? Collection Time: 06/25/21  6:59 PM  ? Specimen: Groin  ?Result Value Ref Range Status  ? Specimen Description   Final  ?  GROIN ?Performed at [facility]

## 2021-06-28 DIAGNOSIS — L03818 Cellulitis of other sites: Secondary | ICD-10-CM | POA: Diagnosis not present

## 2021-06-28 LAB — CBC
HCT: 36.7 % — ABNORMAL LOW (ref 39.0–52.0)
Hemoglobin: 11.7 g/dL — ABNORMAL LOW (ref 13.0–17.0)
MCH: 29.2 pg (ref 26.0–34.0)
MCHC: 31.9 g/dL (ref 30.0–36.0)
MCV: 91.5 fL (ref 80.0–100.0)
Platelets: 207 10*3/uL (ref 150–400)
RBC: 4.01 MIL/uL — ABNORMAL LOW (ref 4.22–5.81)
RDW: 13.1 % (ref 11.5–15.5)
WBC: 6.2 10*3/uL (ref 4.0–10.5)
nRBC: 0 % (ref 0.0–0.2)

## 2021-06-28 MED ORDER — SACCHAROMYCES BOULARDII 250 MG PO CAPS: 250.0000 mg | ORAL_CAPSULE | Freq: Two times a day (BID) | ORAL | 0 refills | Status: AC

## 2021-06-28 MED ORDER — DOXYCYCLINE MONOHYDRATE 100 MG PO TABS
100.0000 mg | ORAL_TABLET | Freq: Two times a day (BID) | ORAL | 0 refills | Status: AC
Start: 2021-06-28 — End: 2021-07-07

## 2021-06-28 MED ORDER — AMOXICILLIN-POT CLAVULANATE 875-125 MG PO TABS: 1.0000 | ORAL_TABLET | Freq: Two times a day (BID) | ORAL | 0 refills | Status: AC

## 2021-06-28 NOTE — Progress Notes (Signed)
Patient and mother were given discharge instructions, and all questions were answered. Patient was stable for discharge and was walked to the main exit. ?

## 2021-06-28 NOTE — Discharge Summary (Signed)
Physician Discharge Summary  ?Jack [name] [id number] DOB: 1986-01-14 DOA: 06/25/2021 ? ?PCP: Arthur Holms, NP ? ?Admit date: 06/25/2021 ?Discharge date: 06/28/2021 ?Recommendations for Outpatient Follow-up:  ?Follow up with PCP , [facility] - Dr Marlou Starks in 1 weeks-call for appointment ?Please obtain BMP/CBC in one week ? ?Discharge Dispo: Back to group home. ?Discharge Condition: Stable ?Code Status:   Code Status: Full Code ?Diet recommendation:  ?Diet Order   ? ?       ?  Diet - low sodium heart healthy       ?  ?  Diet regular Room service appropriate? Yes; Fluid consistency: Thin  Diet effective now       ?  ? ?  ?  ? ?  ?  ? ?Brief/Interim Summary: ?66 yom, w/ developmental delay, intellectual disability, schizoaffective disorder, seizures from group home  presented with lethargy and new abscess. 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 He was sent to PCP where he was found to have redness and swelling to his right groin given a dose of IV Rocephin and sent to ED for further eval.   ?In ED-  had a moderate-sized abscess to the right groin/inner thigh region. CT pelvis showed right proximal scrotum abscess, successfully I&D done in the ED and sent for wound cultures.  CBC showed no leukocytosis.  CMP otherwise unremarkable. started on IV vancomycin and cefepime and admitted for further management.  ?Patient's right groin cellulitis significantly improved, has some drainage now, no induration or fluctuation present on exam, I had Dr. Harlow Asa see patient personally and examine this am AM no induraiton noted-no further need for CT scan or I and D and okay to discharge home on oral antibiotics and have him follow-up with [facility] office number provided.  Contacted patient's mother who will take him back to the group home, antibiotic rx sent for another 9 days along with probiotic ? ?Discharge Diagnoses:   ?Principal Problem: ?  Cellulitis ?Active Problems: ?  Abscess ?  Schizoaffective disorder, bipolar type ([place]) ?  Intellectual disability ?  Anxiety ?  Autism ?  Constipation ?  Normocytic anemia ?  Thrombocytopenia ([place]) ? ?Right groin abscess and Cellulitis: s/p I&D done in the ED. Gram stain- GPR, GPC in chains, culture pending. right groin cellulitis significantly improved,  minimal drainage now, no induration or fluctuation present on exam, I had Dr. Harlow Asa see patient personally and examine this am AM no induraiton noted-no further need for CT scan or I and D and okay to discharge home on oral antibiotics and have him follow-up with [facility] office number provided.  Contacted patient's mother who will take him back to the group home, antibiotic rx sent for another 9 days along with probiotic ?  ?Mild thrombocytopenia likely from abscess. resolved  ?  ?Schizoaffective disorder, bipolar type ?Intellectual disability ?Anxiety ?Autism ?Group home resident: ?Mood stable, cont on supportive care, cont w/ home medications Clozaril,lithium, atarax, Neurontin Depakote ?  ?Constipation:prn laxatives. ?Normocytic anemia: stable, monitor ? ?Consults: ?General surgery Dr. Harlow Asa ?Subjective: ?Resting comfortably, pain much better. ? ?Discharge Exam: ?Vitals:  ? 06/28/21 0622 06/28/21 1020  ?BP: (!) 90/57 100/64  ?Pulse: 74 91  ?Resp: 18 16  ?Temp: (!) 97.4 ?F (36.3 ?C) (!) 97.5 ?F (36.4 ?C)  ?SpO2: 100% 99%  ? ?General: Pt is alert, awake, not in acute distress ?Cardiovascular:  RRR, S1/S2 +, no rubs, no gallops ?Respiratory: CTA bilaterally, no wheezing, no rhonchi ?Abdominal: Soft, NT, ND, bowel sounds + ?Extremities: no edema, no cyanosis ? ?Discharge Instructions ? ?Discharge Instructions   ? ? Diet - low sodium heart healthy   Complete by: As directed ?  ? Discharge instructions   Complete by: As directed ?  ? Please follow-up with primary care doctor and also general surgery Dr. Marlou Starks in a week to make sure groin infection  is healed up ? ?Please call call MD or return to ER for similar or worsening recurring problem that brought you to hospital or if any fever,nausea/vomiting,abdominal pain, uncontrolled pain, chest pain,  shortness of breath or any other alarming symptoms. ? ?Please follow-up your doctor as instructed in a week time and call the office for appointment. ? ?Please avoid alcohol, smoking, or any other illicit substance and maintain healthy habits including taking your regular medications as prescribed. ? ?You were cared for by a hospitalist during your hospital stay. If you have any questions about your discharge medications or the care you received while you were in the hospital after you are discharged, you can call the unit and ask to speak with the hospitalist on call if the hospitalist that took care of you is not available. ? ?Once you are discharged, your primary care physician will handle any further medical issues. Please note that NO REFILLS for any discharge medications will be authorized once you are discharged, as it is imperative that you return to your primary care physician (or establish a relationship with a primary care physician if you do not have one) for your aftercare needs so that they can reassess your need for medications and monitor your lab values  ? Discharge wound care:   Complete by: As directed ?  ? Shower with antibacterial soap daily, use underpant/support and gauze to soak drainage.  ? Increase activity slowly   Complete by: As directed ?  ? ?  ? ?Allergies as of 06/28/2021   ? ?   Reactions  ? Iodinated Contrast Media Other (See Comments)  ? Reaction unknown--per [facility].    ? Latex Rash  ? Shellfish-derived Products Rash  ? ?  ? ?  ?Medication List  ?  ? ?TAKE these medications   ? ?amoxicillin-clavulanate 875-125 MG tablet ?Commonly known as: Augmentin ?Take 1 tablet by mouth 2 (two) times daily for 9 days. ?  ?cloZAPine 100 MG tablet ?Commonly known as: CLOZARIL ?Take 1  tablet (100 mg total) by mouth 2 (two) times daily. For mood control ?What changed:  ?how much to take ?when to take this ?additional instructions ?  ?divalproex 500 MG DR tablet ?Commonly known as: DEPAKOTE ?Take 4 tablets (2,000 mg total) by mouth daily. For mood stabilization/seizures ?What changed:  ?when to take this ?additional instructions ?  ?doxycycline 100 MG tablet ?Commonly known as: ADOXA ?Take 1 tablet (100 mg total) by mouth 2 (two) times daily for 9 days. ?  ?gabapentin 300 MG capsule ?Commonly known as: NEURONTIN ?Take 1 capsule (300 mg total) by mouth 3 (three) times daily. For agitation ?What changed: additional instructions ?  ?hydrOXYzine 50 MG capsule ?Commonly known as: VISTARIL ?Take 50 mg by mouth 4 (four) times daily. Morning, noon, evening and bedtime ?  ?lithium carbonate 450 MG CR tablet ?Commonly known as: ESKALITH ?Take 2 tablets (900 mg total) by mouth daily. For mood stabilization ?What changed:  ?when to take this ?additional instructions ?  ?  magnesium oxide 400 (241.3 Mg) MG tablet ?Commonly known as: MAG-OX ?Take 1 tablet (400 mg total) by mouth daily. For constipation ?What changed: when to take this ?  ?saccharomyces boulardii 250 MG capsule ?Commonly known as: Florastor ?Take 1 capsule (250 mg total) by mouth 2 (two) times daily for 14 days. ?  ? ?  ? ?  ?  ? ? ?  ?Discharge Care Instructions  ?(From admission, onward)  ?  ? ? ?  ? ?  Start     Ordered  ? 06/28/21 0000  Discharge wound care:       ?Comments: Shower with antibacterial soap daily, use underpant/support and gauze to soak drainage.  ? 06/28/21 1056  ? ?  ?  ? ?  ? ? Follow-up Information   ? ? Arthur Holms, NP Follow up in 1 week(s).   ?Specialty: Nurse Practitioner ?Contact information: ?[address] ?[place] [address] ?[phone] ? ? ?  ?  ? ? Armandina Gemma, MD Follow up.   ?Specialty: General Surgery ?Why: Please call the office for follow-up in a week You were seen by Dr. Harlow Asa the hospital on 4/24  morning ?Contact information: ?[address] ?[address] ?[place] [address] ?[phone] ? ? ?  ?  ? ?  ?  ? ?  ? ?Allergies  ?Allergen Reactions  ? Iodinated Contrast Media Other (See Comments)  ?  Reactio

## 2021-06-30 LAB — CULTURE, BLOOD (ROUTINE X 2)
Culture: NO GROWTH
Culture: NO GROWTH
Special Requests: ADEQUATE
Special Requests: ADEQUATE

## 2021-06-30 LAB — AEROBIC/ANAEROBIC CULTURE W GRAM STAIN (SURGICAL/DEEP WOUND): Culture: NORMAL

## 2021-08-26 ENCOUNTER — Encounter (HOSPITAL_COMMUNITY): Payer: Self-pay

## 2021-08-26 ENCOUNTER — Emergency Department (HOSPITAL_COMMUNITY)
Admission: EM | Admit: 2021-08-26 | Discharge: 2021-08-26 | Disposition: A | Discharge status: Home / Self Care | Payer: Medicare HMO | Attending: Emergency Medicine | Admitting: Emergency Medicine

## 2021-08-26 ENCOUNTER — Emergency Department (HOSPITAL_COMMUNITY): Payer: Medicare HMO

## 2021-08-26 DIAGNOSIS — L02212 Cutaneous abscess of back [any part, except buttock]: Secondary | ICD-10-CM | POA: Diagnosis present

## 2021-08-26 DIAGNOSIS — R000 Tachycardia, unspecified: Secondary | ICD-10-CM | POA: Insufficient documentation

## 2021-08-26 DIAGNOSIS — Z91040 Latex allergy status: Secondary | ICD-10-CM | POA: Insufficient documentation

## 2021-08-26 DIAGNOSIS — R404 Transient alteration of awareness: Secondary | ICD-10-CM | POA: Insufficient documentation

## 2021-08-26 DIAGNOSIS — L0291 Cutaneous abscess, unspecified: Secondary | ICD-10-CM

## 2021-08-26 LAB — COMPREHENSIVE METABOLIC PANEL
ALT: 13 U/L (ref 0–44)
AST: 17 U/L (ref 15–41)
Albumin: 4.2 g/dL (ref 3.5–5.0)
Alkaline Phosphatase: 54 U/L (ref 38–126)
Anion gap: 7 (ref 5–15)
BUN: 15 mg/dL (ref 6–20)
CO2: 28 mmol/L (ref 22–32)
Calcium: 9.5 mg/dL (ref 8.9–10.3)
Chloride: 103 mmol/L (ref 98–111)
Creatinine, Ser: 1.24 mg/dL (ref 0.61–1.24)
GFR, Estimated: >60 mL/min (ref >60)
Glucose, Bld: 87 mg/dL (ref 70–99)
Potassium: 4.2 mmol/L (ref 3.5–5.1)
Sodium: 138 mmol/L (ref 135–145)
Total Bilirubin: 0.6 mg/dL (ref 0.3–1.2)
Total Protein: 8 g/dL (ref 6.5–8.1)

## 2021-08-26 LAB — URINALYSIS, ROUTINE W REFLEX MICROSCOPIC
Bacteria, UA: NONE SEEN
Bilirubin Urine: NEGATIVE
Glucose, UA: NEGATIVE mg/dL
Hgb urine dipstick: NEGATIVE
Ketones, ur: 5 mg/dL — AB
Leukocytes,Ua: NEGATIVE
Nitrite: NEGATIVE
Protein, ur: 30 mg/dL — AB
Specific Gravity, Urine: 1.019 (ref 1.005–1.030)
pH: 6 (ref 5.0–8.0)

## 2021-08-26 LAB — CBC WITH DIFFERENTIAL/PLATELET
Abs Immature Granulocytes: 0.03 10*3/uL (ref 0.00–0.07)
Basophils Absolute: 0 10*3/uL (ref 0.0–0.1)
Basophils Relative: 0 %
Eosinophils Absolute: 0.1 10*3/uL (ref 0.0–0.5)
Eosinophils Relative: 2 %
HCT: 39.9 % (ref 39.0–52.0)
Hemoglobin: 12.9 g/dL — ABNORMAL LOW (ref 13.0–17.0)
Immature Granulocytes: 0 %
Lymphocytes Relative: 34 %
Lymphs Abs: 2.2 10*3/uL (ref 0.7–4.0)
MCH: 29.3 pg (ref 26.0–34.0)
MCHC: 32.3 g/dL (ref 30.0–36.0)
MCV: 90.7 fL (ref 80.0–100.0)
Monocytes Absolute: 0.9 10*3/uL (ref 0.1–1.0)
Monocytes Relative: 14 %
Neutro Abs: 3.3 10*3/uL (ref 1.7–7.7)
Neutrophils Relative %: 50 %
Platelets: 143 10*3/uL — ABNORMAL LOW (ref 150–400)
RBC: 4.4 MIL/uL (ref 4.22–5.81)
RDW: 15.1 % (ref 11.5–15.5)
WBC: 6.7 10*3/uL (ref 4.0–10.5)
nRBC: 0 % (ref 0.0–0.2)

## 2021-08-26 LAB — MAGNESIUM: Magnesium: 2.5 mg/dL — ABNORMAL HIGH (ref 1.7–2.4)

## 2021-08-26 LAB — LACTIC ACID, PLASMA: Lactic Acid, Venous: 0.9 mmol/L (ref 0.5–1.9)

## 2021-08-26 LAB — VALPROIC ACID LEVEL: Valproic Acid Lvl: 88 ug/mL (ref 50.0–100.0)

## 2021-08-26 LAB — CBG MONITORING, ED: Glucose-Capillary: 83 mg/dL (ref 70–99)

## 2021-08-26 LAB — LITHIUM LEVEL: Lithium Lvl: 0.71 mmol/L (ref 0.60–1.20)

## 2021-08-26 MED ORDER — LIDOCAINE-EPINEPHRINE (PF) 2 %-1:200000 IJ SOLN
10.0000 mL | Freq: Once | INTRAMUSCULAR | Status: AC
  Administered 2021-08-26: 10 mL
  Filled 2021-08-26: qty 20

## 2021-08-26 MED ORDER — DOXYCYCLINE HYCLATE 100 MG PO CAPS: 100.0000 mg | ORAL_CAPSULE | Freq: Two times a day (BID) | ORAL | 0 refills | Status: DC

## 2021-08-26 MED ORDER — SODIUM CHLORIDE 0.9 % IV BOLUS
1000.0000 mL | Freq: Once | INTRAVENOUS | Status: AC
  Administered 2021-08-26: 1000 mL via INTRAVENOUS

## 2021-08-26 MED ORDER — DOXYCYCLINE HYCLATE 100 MG PO TABS
100.0000 mg | ORAL_TABLET | Freq: Once | ORAL | Status: AC
  Administered 2021-08-26: 100 mg via ORAL
  Filled 2021-08-26: qty 1

## 2021-08-26 NOTE — ED Provider Triage Note (Signed)
Emergency Medicine Provider Triage Evaluation Note  Jack [name] , a 36 y.o. male  was evaluated in triage.  Pt presents from group home.  Patient possibly had seizure at 7 AM this morning.  Per EMS, group home staff states that the patient was lethargic upon waking and has a history of seizures.  Staff reports at this time patient appears to be at baseline.  Patient is nonverbal with me.  Patient also has apparent abscess to left shoulder area  Review of Systems  Positive: Lethargy, possible seizure, possible abscess Negative: Unable to perform due to patient not communicative  Physical Exam  BP (!) 131/91 (BP Location: Right Arm)   Pulse (!) 106   Temp 98.1 F (36.7 C) (Oral)   Resp 16   SpO2 99%  Gen:   Awake, no distress, nonverbal, lethargic Resp:  Normal effort  MSK:   Moves extremities without difficulty  Other:    Medical Decision Making  Medically screening exam initiated at 9:27 AM.  Appropriate orders placed.  Jack [name] was informed that the remainder of the evaluation will be completed by another provider, this initial triage assessment does not replace that evaluation, and the importance of remaining in the ED until their evaluation is complete.     Darrick Grinder, [place] 08/26/21 [phone]

## 2021-08-26 NOTE — ED Notes (Signed)
Border dressing applied to pts left shoulder

## 2021-08-26 NOTE — ED Triage Notes (Signed)
Unable to complete triage assessment due to patient's condition and mental abilities.

## 2021-08-26 NOTE — ED Provider Notes (Signed)
[facility]-EMERGENCY DEPT Provider Note   CSN: [id number] Arrival date & time: 08/26/21  0848     History  Chief Complaint  Patient presents with   lethargic   Abscess    Jack [name] is a 36 y.o. male.  Pt is a 36 yo male with pmhx significant for seizures, MR, schizophrenia, and bipolar d/o.  Pt lives in a group home.  The group home told EMS that he may have had a seizure this am around 0700.  No one saw this, but he has been "lethargic" since walking up.  The pt is unable to give any hx.  His mother has been contacted, but is still not here.       Home Medications Prior to Admission medications   Medication Sig Start Date End Date Taking? Authorizing Provider  doxycycline (VIBRAMYCIN) 100 MG capsule Take 1 capsule (100 mg total) by mouth 2 (two) times daily. 08/26/21  Yes Jacalyn Lefevre, MD  cloZAPine (CLOZARIL) 100 MG tablet Take 1 tablet (100 mg total) by mouth 2 (two) times daily. For mood control Patient taking differently: Take 100-350 mg by mouth See admin instructions. Take 1 tablet (100 mg) by mouth every morning and evening (4pm); take 3 1/2 tablets (350 mg) at bedtime 02/20/17   Armandina Stammer I, NP  divalproex (DEPAKOTE) 500 MG DR tablet Take 4 tablets (2,000 mg total) by mouth daily. For mood stabilization/seizures Patient taking differently: Take 2,000 mg by mouth every evening. For mood stabilization/seizures (4pm) 02/20/17   Armandina Stammer I, NP  gabapentin (NEURONTIN) 300 MG capsule Take 1 capsule (300 mg total) by mouth 3 (three) times daily. For agitation Patient taking differently: Take 300 mg by mouth 3 (three) times daily. For agitation - morning, noon and evening (4pm) 02/20/17   Armandina Stammer I, NP  hydrOXYzine (VISTARIL) 50 MG capsule Take 50 mg by mouth 4 (four) times daily. Morning, noon, evening and bedtime 03/30/21   [provider]  lithium carbonate (ESKALITH) 450 MG CR tablet Take 2 tablets (900 mg total) by mouth daily.  For mood stabilization Patient taking differently: Take 900 mg by mouth every evening. For mood stabilization (4pm) 02/20/17   Armandina Stammer I, NP      Allergies    Iodinated contrast media, Latex, and Shellfish-derived products    Review of Systems   Review of Systems  Unable to perform ROS: Mental status change  All other systems reviewed and are negative.   Physical Exam Updated Vital Signs BP 128/88   Pulse 88   Temp 98.1 F (36.7 C) (Oral)   Resp 16   SpO2 98%  Physical Exam Vitals and nursing note reviewed.  Constitutional:      Appearance: Normal appearance.  HENT:     Head: Normocephalic and atraumatic.     Right Ear: External ear normal.     Left Ear: External ear normal.     Nose: Nose normal.     Mouth/Throat:     Mouth: Mucous membranes are moist.     Pharynx: Oropharynx is clear.  Eyes:     Extraocular Movements: Extraocular movements intact.     Conjunctiva/sclera: Conjunctivae normal.     Pupils: Pupils are equal, round, and reactive to light.  Cardiovascular:     Rate and Rhythm: Regular rhythm. Tachycardia present.     Pulses: Normal pulses.     Heart sounds: Normal heart sounds.  Pulmonary:     Effort: Pulmonary effort is normal.  Breath sounds: Normal breath sounds.  Abdominal:     General: Abdomen is flat. Bowel sounds are normal.     Palpations: Abdomen is soft.  Musculoskeletal:        General: Normal range of motion.     Cervical back: Normal range of motion and neck supple.  Skin:    General: Skin is warm.     Capillary Refill: Capillary refill takes less than 2 seconds.     Comments: Small abscess posterior shoulder  Neurological:     Mental Status: He is alert.     Comments: Pt will tell me his name, but that is it.  He is moving all 4 extremities, but is only following commands sporadically.  Psychiatric:        Behavior: Behavior is uncooperative.     ED Results / Procedures / Treatments   Labs (all labs ordered are listed,  but only abnormal results are displayed) Labs Reviewed  URINALYSIS, ROUTINE W REFLEX MICROSCOPIC - Abnormal; Notable for the following components:      Result Value   Ketones, ur 5 (*)    Protein, ur 30 (*)    All other components within normal limits  MAGNESIUM - Abnormal; Notable for the following components:   Magnesium 2.5 (*)    All other components within normal limits  CBC WITH DIFFERENTIAL/PLATELET - Abnormal; Notable for the following components:   Hemoglobin 12.9 (*)    Platelets 143 (*)    All other components within normal limits  URINE CULTURE  VALPROIC ACID LEVEL  LITHIUM LEVEL  COMPREHENSIVE METABOLIC PANEL  LACTIC ACID, PLASMA  CBC WITH DIFFERENTIAL/PLATELET  CLOZAPINE (CLOZARIL)  LACTIC ACID, PLASMA  CBG MONITORING, ED    EKG None  Radiology DG Chest Portable 1 View  Result Date: 08/26/2021 CLINICAL DATA:  Seizures, altered mental status EXAM: PORTABLE CHEST 1 VIEW COMPARISON:  04/06/2021 FINDINGS: The heart size and mediastinal contours are within normal limits. Both lungs are clear. The visualized skeletal structures are unremarkable. IMPRESSION: No active disease. Electronically Signed   By: Ernie Avena M.D.   On: 08/26/2021 10:35    Procedures .Marland KitchenIncision and Drainage  Date/Time: 08/26/2021 11:36 AM  Performed by: Jacalyn Lefevre, MD Authorized by: Jacalyn Lefevre, MD   Consent:    Consent obtained:  Verbal Universal protocol:    Patient identity confirmed:  Verbally with patient Location:    Type:  Abscess   Size:  3   Location:  Trunk   Trunk location:  Back Pre-procedure details:    Skin preparation:  Povidone-iodine Sedation:    Sedation type:  None Anesthesia:    Anesthesia method:  Local infiltration   Local anesthetic:  Lidocaine 2% WITH epi Procedure type:    Complexity:  Simple Procedure details:    Incision types:  Single straight   Wound management:  Probed and deloculated   Drainage:  Purulent   Drainage amount:   Copious   Wound treatment:  Wound left open Post-procedure details:    Procedure completion:  Tolerated well, no immediate complications     Medications Ordered in ED Medications  sodium chloride 0.9 % bolus 1,000 mL (0 mLs Intravenous Stopped 08/26/21 1233)  lidocaine-EPINEPHrine (XYLOCAINE W/EPI) 2 %-1:200000 (PF) injection 10 mL (10 mLs Infiltration Given 08/26/21 1033)  doxycycline (VIBRA-TABS) tablet 100 mg (100 mg Oral Given 08/26/21 1236)    ED Course/ Medical Decision Making/ A&P  Medical Decision Making Amount and/or Complexity of Data Reviewed Labs: ordered. Radiology: ordered.  Risk Prescription drug management.   This patient presents to the ED for concern of AMS, this involves an extensive number of treatment options, and is a complaint that carries with it a high risk of complications and morbidity.  The differential diagnosis includes post ictal, septic, infection, electrolyte abn   Co morbidities that complicate the patient evaluation  seizures, MR, schizophrenia, and bipolar d/o   Additional history obtained:  Additional history obtained from epic chart review   Lab Tests:  I Ordered, and personally interpreted labs.  The pertinent results include:  cmp nl, mg 2.5, li 0.71, depakote 88, lactic 0.9, ua neg   Imaging Studies ordered:  I ordered imaging studies including cxr  I independently visualized and interpreted imaging which showed  IMPRESSION:  No active disease.   I agree with the radiologist interpretation   Cardiac Monitoring:  The patient was maintained on a cardiac monitor.  I personally viewed and interpreted the cardiac monitored which showed an underlying rhythm of: nsr   Medicines ordered and prescription drug management:  I ordered medication including doxy  for abscess  Reevaluation of the patient after these medicines showed that the patient improved I have reviewed the patients home medicines and  have made adjustments as needed   Critical Interventions:  ivfs   Problem List / ED Course:  Skin abscess:  I&D'd.  Pt d/c with doxy. AMS:  pt is much more alert now.  Pt has no evidence of infection or sepsis.  Pt likely did have a seizure.  Pt d/w his mom.  He will need to f/u with his neurologist.  Elevated Mg:  pt to hold Mg   Reevaluation:  After the interventions noted above, I reevaluated the patient and found that they have :improved   Social Determinants of Health:  Lives in a group home   Dispostion:  After consideration of the diagnostic results and the patients response to treatment, I feel that the patent would benefit from discharge with outpatient f/u.          Final Clinical Impression(s) / ED Diagnoses Final diagnoses:  Abscess  Transient alteration of awareness    Rx / DC Orders ED Discharge Orders          Ordered    doxycycline (VIBRAMYCIN) 100 MG capsule  2 times daily        08/26/21 1506              Jacalyn Lefevre, MD 08/26/21 1511

## 2021-08-26 NOTE — ED Triage Notes (Signed)
Per EMS- Patient is from a group home and mother was contacted by the Group home and she said she would meet the patient here.  Group home staff report that the patient woke at 0700 today and was lethargic. Patient has a history of seizures. Staff tried to get the patient to show his tongue, but would not.  Staff reports that the patient is at his baseline.  Patient also has an abscess to the left shoulder area.

## 2021-08-27 LAB — URINE CULTURE: Culture: NO GROWTH

## 2021-08-31 LAB — CLOZAPINE (CLOZARIL)
Clozapine Lvl: 3190 ng/mL — ABNORMAL HIGH (ref 350–600)
NorClozapine: 780 ng/mL
Total(Cloz+Norcloz): 3970 ng/mL

## 2021-09-27 ENCOUNTER — Emergency Department (HOSPITAL_COMMUNITY): Payer: Medicare HMO

## 2021-09-27 ENCOUNTER — Encounter (HOSPITAL_COMMUNITY): Payer: Self-pay

## 2021-09-27 ENCOUNTER — Emergency Department (HOSPITAL_COMMUNITY)
Admission: EM | Admit: 2021-09-27 | Discharge: 2021-09-27 | Disposition: A | Discharge status: Home / Self Care | Payer: Medicare HMO | Attending: Emergency Medicine | Admitting: Emergency Medicine

## 2021-09-27 ENCOUNTER — Other Ambulatory Visit: Payer: Self-pay

## 2021-09-27 DIAGNOSIS — Z91040 Latex allergy status: Secondary | ICD-10-CM | POA: Diagnosis not present

## 2021-09-27 DIAGNOSIS — R590 Localized enlarged lymph nodes: Secondary | ICD-10-CM | POA: Diagnosis not present

## 2021-09-27 DIAGNOSIS — F840 Autistic disorder: Secondary | ICD-10-CM | POA: Insufficient documentation

## 2021-09-27 DIAGNOSIS — R569 Unspecified convulsions: Secondary | ICD-10-CM | POA: Diagnosis present

## 2021-09-27 DIAGNOSIS — G40909 Epilepsy, unspecified, not intractable, without status epilepticus: Secondary | ICD-10-CM | POA: Insufficient documentation

## 2021-09-27 LAB — URINALYSIS, ROUTINE W REFLEX MICROSCOPIC
Bilirubin Urine: NEGATIVE
Glucose, UA: NEGATIVE mg/dL
Hgb urine dipstick: NEGATIVE
Ketones, ur: 5 mg/dL — AB
Leukocytes,Ua: NEGATIVE
Nitrite: NEGATIVE
Protein, ur: NEGATIVE mg/dL
Specific Gravity, Urine: 1.01 (ref 1.005–1.030)
pH: 8 (ref 5.0–8.0)

## 2021-09-27 LAB — CBC WITH DIFFERENTIAL/PLATELET
Abs Immature Granulocytes: 0.04 10*3/uL (ref 0.00–0.07)
Basophils Absolute: 0 10*3/uL (ref 0.0–0.1)
Basophils Relative: 0 %
Eosinophils Absolute: 0.2 10*3/uL (ref 0.0–0.5)
Eosinophils Relative: 2 %
HCT: 42.2 % (ref 39.0–52.0)
Hemoglobin: 13.6 g/dL (ref 13.0–17.0)
Immature Granulocytes: 0 %
Lymphocytes Relative: 29 %
Lymphs Abs: 2.6 10*3/uL (ref 0.7–4.0)
MCH: 30 pg (ref 26.0–34.0)
MCHC: 32.2 g/dL (ref 30.0–36.0)
MCV: 93 fL (ref 80.0–100.0)
Monocytes Absolute: 0.6 10*3/uL (ref 0.1–1.0)
Monocytes Relative: 7 %
Neutro Abs: 5.5 10*3/uL (ref 1.7–7.7)
Neutrophils Relative %: 62 %
Platelets: 131 10*3/uL — ABNORMAL LOW (ref 150–400)
RBC: 4.54 MIL/uL (ref 4.22–5.81)
RDW: 15.2 % (ref 11.5–15.5)
WBC: 9 10*3/uL (ref 4.0–10.5)
nRBC: 0 % (ref 0.0–0.2)

## 2021-09-27 LAB — COMPREHENSIVE METABOLIC PANEL
ALT: 10 U/L (ref 0–44)
AST: 12 U/L — ABNORMAL LOW (ref 15–41)
Albumin: 3.8 g/dL (ref 3.5–5.0)
Alkaline Phosphatase: 52 U/L (ref 38–126)
Anion gap: 7 (ref 5–15)
BUN: 9 mg/dL (ref 6–20)
CO2: 27 mmol/L (ref 22–32)
Calcium: 9.2 mg/dL (ref 8.9–10.3)
Chloride: 107 mmol/L (ref 98–111)
Creatinine, Ser: 1.36 mg/dL — ABNORMAL HIGH (ref 0.61–1.24)
GFR, Estimated: >60 mL/min (ref >60)
Glucose, Bld: 87 mg/dL (ref 70–99)
Potassium: 3.8 mmol/L (ref 3.5–5.1)
Sodium: 141 mmol/L (ref 135–145)
Total Bilirubin: 0.7 mg/dL (ref 0.3–1.2)
Total Protein: 6.7 g/dL (ref 6.5–8.1)

## 2021-09-27 LAB — RAPID URINE DRUG SCREEN, HOSP PERFORMED
Amphetamines: NOT DETECTED
Barbiturates: NOT DETECTED
Benzodiazepines: NOT DETECTED
Cocaine: NOT DETECTED
Opiates: NOT DETECTED
Tetrahydrocannabinol: NOT DETECTED

## 2021-09-27 LAB — LITHIUM LEVEL: Lithium Lvl: 0.94 mmol/L (ref 0.60–1.20)

## 2021-09-27 LAB — MAGNESIUM: Magnesium: 2.1 mg/dL (ref 1.7–2.4)

## 2021-09-27 LAB — VALPROIC ACID LEVEL: Valproic Acid Lvl: 91 ug/mL (ref 50.0–100.0)

## 2021-09-27 MED ORDER — LACTATED RINGERS IV BOLUS
1000.0000 mL | Freq: Once | INTRAVENOUS | Status: AC
  Administered 2021-09-27: 1000 mL via INTRAVENOUS

## 2021-09-27 MED ORDER — DIPHENHYDRAMINE HCL 50 MG/ML IJ SOLN
50.0000 mg | Freq: Once | INTRAMUSCULAR | Status: AC
  Administered 2021-09-27: 50 mg via INTRAVENOUS
  Filled 2021-09-27: qty 1

## 2021-09-27 MED ORDER — DIVALPROEX SODIUM 250 MG PO DR TAB
2000.0000 mg | DELAYED_RELEASE_TABLET | Freq: Once | ORAL | Status: AC
  Administered 2021-09-27: 2000 mg via ORAL
  Filled 2021-09-27: qty 8

## 2021-09-27 MED ORDER — DIPHENHYDRAMINE HCL 25 MG PO CAPS: 50.0000 mg | ORAL_CAPSULE | Freq: Once | ORAL | Status: AC

## 2021-09-27 MED ORDER — IOHEXOL 350 MG/ML SOLN
80.0000 mL | Freq: Once | INTRAVENOUS | Status: AC | PRN
  Administered 2021-09-27: 80 mL via INTRAVENOUS

## 2021-09-27 MED ORDER — DOXYCYCLINE HYCLATE 100 MG PO CAPS: 100.0000 mg | ORAL_CAPSULE | Freq: Two times a day (BID) | ORAL | 0 refills | Status: DC

## 2021-09-27 MED ORDER — METHYLPREDNISOLONE SODIUM SUCC 40 MG IJ SOLR
40.0000 mg | Freq: Once | INTRAMUSCULAR | Status: AC
  Administered 2021-09-27: 40 mg via INTRAVENOUS
  Filled 2021-09-27: qty 1

## 2021-09-27 MED ORDER — DIPHENHYDRAMINE HCL 25 MG PO CAPS
50.0000 mg | ORAL_CAPSULE | Freq: Once | ORAL | Status: AC
Start: 2021-09-27 — End: 2021-09-27

## 2021-09-27 NOTE — Discharge Instructions (Signed)
Start taking the doxycycline for the right groin cyst or abscess.  Warm compresses to that area would be helpful.  This may drain spontaneously.  CT scan showed no evidence of any deep infection.  Also patient's antiseizure medicine was therapeutic.  He did receive a dose of his Depakote here.  Continue his antiseizure medicines.  Follow-up with primary care doctor as needed for reevaluation of the right groin infection.

## 2021-09-27 NOTE — ED Provider Notes (Signed)
[facility] EMERGENCY DEPARTMENT Provider Note   CSN: [id number] Arrival date & time: 09/27/21  4696     History  Chief Complaint  Patient presents with   Seizures    Jack [name] is a 36 y.o. male.  HPI Patient presents for seizures.  Medical history includes seizure disorder, bipolar disorder, intellectual disability, autism, schizoaffective disorder.  Patient presents from group home via EMS.  Group home reports that he had urinated on himself when they went to get him for a shower this morning.  He had 3 witnessed seizures at his group home.  Patient had returned to mental baseline by the time of EMS arrival.  At baseline, he is alert and oriented to self only.  Group home did not report any recent changes to his AEDs.  He has recently been on Humira which was stopped due to concern of increased seizure activity.  Prior to today, last seizure was 1 to 2 weeks ago.  EMS reports normal vital signs and normal blood glucose prior to arrival.  Per chart review, he was last seen in the ED 1 month ago following a seizure.    Home Medications Prior to Admission medications   Medication Sig Start Date End Date Taking? Authorizing Provider  benztropine (COGENTIN) 0.5 MG tablet Take 0.5 mg by mouth at bedtime.    [provider]  cloZAPine (CLOZARIL) 100 MG tablet Take 1 tablet (100 mg total) by mouth 2 (two) times daily. For mood control Patient taking differently: Take 50-100 mg by mouth See admin instructions. Take 100 mg by mouth in the morning and afternoon & 50 mg at bedtime 02/20/17   Armandina Stammer I, NP  divalproex (DEPAKOTE) 500 MG DR tablet Take 4 tablets (2,000 mg total) by mouth daily. For mood stabilization/seizures Patient taking differently: Take 2,000 mg by mouth See admin instructions. Take 2,000 mg by mouth at 4 PM daily 02/20/17   Armandina Stammer I, NP  doxycycline (VIBRAMYCIN) 100 MG capsule Take 1 capsule (100 mg total) by mouth 2 (two) times daily.  08/26/21   Jacalyn Lefevre, MD  gabapentin (NEURONTIN) 300 MG capsule Take 1 capsule (300 mg total) by mouth 3 (three) times daily. For agitation Patient taking differently: Take 300 mg by mouth 3 (three) times daily. 02/20/17   Sanjuana Kava, NP  HUMIRA PEN-CD/UC/HS STARTER 80 MG/0.8ML PNKT Inject 80 mg into the skin every 28 (twenty-eight) days. 08/04/21   [provider]  hydrOXYzine (VISTARIL) 50 MG capsule Take 50 mg by mouth 4 (four) times daily. 03/30/21   [provider]  lithium carbonate (ESKALITH) 450 MG CR tablet Take 2 tablets (900 mg total) by mouth daily. For mood stabilization Patient taking differently: Take 450 mg by mouth See admin instructions. Take 450 mg by mouth at 4 PM daily 02/20/17   Armandina Stammer I, NP  magnesium oxide (MAG-OX) 400 (240 Mg) MG tablet Take 400 mg by mouth in the morning.    [provider]      Allergies    Iodinated contrast media, Latex, and Shellfish-derived products    Review of Systems   Review of Systems  Unable to perform ROS: Psychiatric disorder    Physical Exam Updated Vital Signs BP 117/82   Pulse 93   Temp 97.6 F (36.4 C) (Oral)   Resp 13   SpO2 100%  Physical Exam Vitals and nursing note reviewed.  Constitutional:      General: He is not in acute  distress.    Appearance: He is well-developed and normal weight. He is not ill-appearing, toxic-appearing or diaphoretic.  HENT:     Head: Normocephalic and atraumatic.     Right Ear: External ear normal.     Left Ear: External ear normal.     Nose: Nose normal.     Mouth/Throat:     Mouth: Mucous membranes are moist.  Eyes:     Extraocular Movements: Extraocular movements intact.     Conjunctiva/sclera: Conjunctivae normal.  Cardiovascular:     Rate and Rhythm: Normal rate and regular rhythm.     Heart sounds: No murmur heard. Pulmonary:     Effort: Pulmonary effort is normal. No respiratory distress.     Breath sounds: Normal breath sounds. No  wheezing or rales.  Chest:     Chest wall: No tenderness.  Abdominal:     General: There is no distension.     Palpations: Abdomen is soft.     Tenderness: There is no abdominal tenderness.  Musculoskeletal:        General: No swelling. Normal range of motion.     Cervical back: Normal range of motion and neck supple. No rigidity or tenderness.     Right lower leg: No edema.     Left lower leg: No edema.  Skin:    General: Skin is warm and dry.     Coloration: Skin is not jaundiced or pale.  Neurological:     General: No focal deficit present.     Mental Status: He is alert. Mental status is at baseline.  Psychiatric:        Mood and Affect: Mood normal.        Speech: He is noncommunicative.        Behavior: Behavior is uncooperative, agitated and withdrawn.     ED Results / Procedures / Treatments   Labs (all labs ordered are listed, but only abnormal results are displayed) Labs Reviewed  COMPREHENSIVE METABOLIC PANEL - Abnormal; Notable for the following components:      Result Value   Creatinine, Ser 1.36 (*)    AST 12 (*)    All other components within normal limits  CBC WITH DIFFERENTIAL/PLATELET - Abnormal; Notable for the following components:   Platelets 131 (*)    All other components within normal limits  MAGNESIUM  VALPROIC ACID LEVEL  LITHIUM LEVEL  URINALYSIS, ROUTINE W REFLEX MICROSCOPIC  RAPID URINE DRUG SCREEN, HOSP PERFORMED  CLOZAPINE (CLOZARIL)    EKG None  Radiology CT Head Wo Contrast  Result Date: 09/27/2021 CLINICAL DATA:  Mental status change, unknown cause EXAM: CT HEAD WITHOUT CONTRAST TECHNIQUE: Contiguous axial images were obtained from the base of the skull through the vertex without intravenous contrast. RADIATION DOSE REDUCTION: This exam was performed according to the departmental dose-optimization program which includes automated exposure control, adjustment of the mA and/or kV according to patient size and/or use of iterative  reconstruction technique. COMPARISON:  CT head 03/11/2016. FINDINGS: Brain: No evidence of acute infarction, hemorrhage, hydrocephalus, extra-axial collection or mass lesion/mass effect. Vascular: No hyperdense vessel identified. Skull: No acute fracture. Sinuses/Orbits: Mild paranasal sinus mucosal thickening. No acute orbital findings. Other: No mastoid effusions. IMPRESSION: No evidence of acute intracranial abnormality. Electronically Signed   By: Feliberto Harts M.D.   On: 09/27/2021 10:52    Procedures Procedures    Medications Ordered in ED Medications  diphenhydrAMINE (BENADRYL) capsule 50 mg (has no administration in time range)    Or  diphenhydrAMINE (BENADRYL) injection 50 mg (has no administration in time range)  lactated ringers bolus 1,000 mL (0 mLs Intravenous Stopped 09/27/21 1536)  methylPREDNISolone sodium succinate (SOLU-MEDROL) 40 mg/mL injection 40 mg (40 mg Intravenous Given 09/27/21 1322)  diphenhydrAMINE (BENADRYL) capsule 50 mg ( Oral See Alternative 09/27/21 1317)    Or  diphenhydrAMINE (BENADRYL) injection 50 mg (50 mg Intravenous Given 09/27/21 1317)  divalproex (DEPAKOTE) DR tablet 2,000 mg (2,000 mg Oral Given 09/27/21 1323)    ED Course/ Medical Decision Making/ A&P                           Medical Decision Making Amount and/or Complexity of Data Reviewed Labs: ordered. Radiology: ordered.  Risk Prescription drug management.   This patient presents to the ED for concern of seizures, this involves an extensive number of treatment options, and is a complaint that carries with it a high risk of complications and morbidity.  The differential diagnosis includes medication nonadherence, medication not effective, polypharmacy, infection, metabolic abnormalities, poor sleep, head trauma   Co morbidities that complicate the patient evaluation   seizure disorder, bipolar disorder, intellectual disability, autism, schizoaffective disorder   Additional history  obtained:  Additional history obtained from EMS, patient's mother External records from outside source obtained and reviewed including EMR   Lab Tests:  I Ordered, and personally interpreted labs.  The pertinent results include: Normal lab findings, including Depakote and lithium levels.  No leukocytosis to suggest systemic infection.  Normal electrolytes.   Imaging Studies ordered:  I ordered imaging studies including CT head, CT of pelvis I independently visualized and interpreted imaging which showed no acute findings on CT head.  CT pelvis pending at time of signout. I agree with the radiologist interpretation   Cardiac Monitoring: / EKG:  The patient was maintained on a cardiac monitor.  I personally viewed and interpreted the cardiac monitored which showed an underlying rhythm of: Sinus rhythm  Problem List / ED Course / Critical interventions / Medication management  Patient is a 36 year old male with history of psychiatric illness and seizure disorder, currently on Depakote for both AED and behavioral disturbances, presents for breakthrough seizures this morning.  EMS reports that he had 3 witnessed seizures at his group home.  He was awake and alert on their arrival.  At baseline, he is oriented to self only.  Blood sugar was normal prior to arrival.  On arrival, patient is able to answer basic questions.  He is uncooperative on exam.  He has no evidence of head trauma.  He does not cooperate with an oral exam to assess for tongue bite.  Patient was placed on bedside cardiac monitor.  IV fluids were initiated.  Laboratory work-up was initiated.  Patient has a history of recurrent skin abscesses.  He was reportedly started on Humira for at bedtime.  On examination of skin, it does appear that he has a area of erythema and tenderness to his right groin.  This has been a previous site of abscesses.  I am unable to appreciate any induration but patient also does not cooperate for  thorough exam.  There are no other obvious areas of new abscesses.  He does have evidence of old abscesses in his bilateral axilla and these areas are tender, however, there is no overlying erythema.  Patient has history of allergy to contrast.  He has tolerated contrasted CT scans following pretreatment.  Plan will be for CT scan  of pelvis to further assess groin abscess/es.  Pretreatment was ordered.  Patient's home dose of Depakote was given.  On patient's lab work it does appear that he has therapeutic levels of both lithium and Depakote.  He has no leukocytosis and his electrolytes are normal.  CT scan of head did not show any acute findings.  Patient had no further episodes of seizure-like activity while in the ED.  He remained at his reported mental baseline.  I spoke with his mother and updated her on his ED course.  Patient's mother states that she will be by after she leaves work at 5 PM.  Care of patient was signed out to oncoming ED provider. I ordered medication including IV fluids for hydration; Depakote for seizure prophylaxis; Benadryl and Solu-Medrol for contrasted scan pretreatment Reevaluation of the patient after these medicines showed that the patient stayed the same I have reviewed the patients home medicines and have made adjustments as needed   Social Determinants of Health:  History of intellectual disability and psychiatric disorders, currently residing in a group home        Final Clinical Impression(s) / ED Diagnoses Final diagnoses:  Seizure [facility])    Rx / DC Orders ED Discharge Orders     None         Gloris Manchester, MD 09/27/21 1617

## 2021-09-27 NOTE — ED Notes (Signed)
All discharge instructions including follow up care and prescriptions reviewed with patient's legal guardian and mother at bedside. Patient stable and ambulatory at time of discharge.

## 2021-09-27 NOTE — ED Triage Notes (Signed)
Pt arriving from a group home via [facility]. The staff went to get him up to go take a shower this morning and noticed that he urinated on himself. Thet witnessed him have 3 seizures back to back. After his seizures he would get agitated more easily, he is currently reported to be at baseline.  PT last had a seizure a week ago, where he was transported to [facility].

## 2021-09-27 NOTE — ED Notes (Signed)
This RN found patient to be ambulating in the hallway and patient had removed all cardiac leads, BP cuff and pulse ox. Patient also removed bilateral PIV's. This RN redirected patient back into bed at this time. Patient placed back on monitor.

## 2021-09-27 NOTE — ED Provider Notes (Signed)
CT scan of the pelvis showed no evidence of any significant abscesses.  The right groin certainly has an area of induration measuring about 3 cm.  Could be early formation of abscess.  But based on the patient's autism.  Think we will treat with oral antibiotics warm compresses.  Patient has evidence of multiple cyst in the groin area and perianal area and at the base of the scrotum as well.  None of them appear acutely infected.  CT scan showed no evidence of any deep infections.  We will treat with doxycycline patient is therapeutic on his antiseizure medicines.  We will have him follow-up with primary care doctor.  Discussed with patient's mother who is in agreement with the treatment plan.   Vanetta Mulders, MD 09/27/21 [phone]

## 2021-09-29 LAB — CLOZAPINE (CLOZARIL)
Clozapine Lvl: 1987 ng/mL — ABNORMAL HIGH (ref 350–600)
NorClozapine: 562 ng/mL
Total(Cloz+Norcloz): 2549 ng/mL

## 2021-11-25 ENCOUNTER — Emergency Department (HOSPITAL_COMMUNITY): Payer: Medicare HMO

## 2021-11-25 ENCOUNTER — Other Ambulatory Visit: Payer: Self-pay

## 2021-11-25 ENCOUNTER — Emergency Department (HOSPITAL_COMMUNITY)
Admission: EM | Admit: 2021-11-25 | Discharge: 2021-11-25 | Disposition: A | Discharge status: Home / Self Care | Payer: Medicare HMO | Attending: Emergency Medicine | Admitting: Emergency Medicine

## 2021-11-25 DIAGNOSIS — T426X1A Poisoning by other antiepileptic and sedative-hypnotic drugs, accidental (unintentional), initial encounter: Secondary | ICD-10-CM | POA: Diagnosis present

## 2021-11-25 DIAGNOSIS — R531 Weakness: Secondary | ICD-10-CM | POA: Diagnosis not present

## 2021-11-25 DIAGNOSIS — Z91040 Latex allergy status: Secondary | ICD-10-CM | POA: Insufficient documentation

## 2021-11-25 LAB — CBC WITH DIFFERENTIAL/PLATELET
Abs Immature Granulocytes: 0.02 10*3/uL (ref 0.00–0.07)
Basophils Absolute: 0 10*3/uL (ref 0.0–0.1)
Basophils Relative: 0 %
Eosinophils Absolute: 0.1 10*3/uL (ref 0.0–0.5)
Eosinophils Relative: 2 %
HCT: 42.4 % (ref 39.0–52.0)
Hemoglobin: 13.7 g/dL (ref 13.0–17.0)
Immature Granulocytes: 0 %
Lymphocytes Relative: 50 %
Lymphs Abs: 2.9 10*3/uL (ref 0.7–4.0)
MCH: 30.2 pg (ref 26.0–34.0)
MCHC: 32.3 g/dL (ref 30.0–36.0)
MCV: 93.4 fL (ref 80.0–100.0)
Monocytes Absolute: 0.4 10*3/uL (ref 0.1–1.0)
Monocytes Relative: 6 %
Neutro Abs: 2.5 10*3/uL (ref 1.7–7.7)
Neutrophils Relative %: 42 %
Platelets: 146 10*3/uL — ABNORMAL LOW (ref 150–400)
RBC: 4.54 MIL/uL (ref 4.22–5.81)
RDW: 13.7 % (ref 11.5–15.5)
WBC: 5.8 10*3/uL (ref 4.0–10.5)
nRBC: 0 % (ref 0.0–0.2)

## 2021-11-25 LAB — COMPREHENSIVE METABOLIC PANEL
ALT: 11 U/L (ref 0–44)
AST: 16 U/L (ref 15–41)
Albumin: 4 g/dL (ref 3.5–5.0)
Alkaline Phosphatase: 49 U/L (ref 38–126)
Anion gap: 6 (ref 5–15)
BUN: 10 mg/dL (ref 6–20)
CO2: 26 mmol/L (ref 22–32)
Calcium: 9.2 mg/dL (ref 8.9–10.3)
Chloride: 109 mmol/L (ref 98–111)
Creatinine, Ser: 1.23 mg/dL (ref 0.61–1.24)
GFR, Estimated: >60 mL/min (ref >60)
Glucose, Bld: 83 mg/dL (ref 70–99)
Potassium: 3.9 mmol/L (ref 3.5–5.1)
Sodium: 141 mmol/L (ref 135–145)
Total Bilirubin: 0.5 mg/dL (ref 0.3–1.2)
Total Protein: 7 g/dL (ref 6.5–8.1)

## 2021-11-25 LAB — AMMONIA: Ammonia: 23 umol/L (ref 9–35)

## 2021-11-25 LAB — VALPROIC ACID LEVEL
Valproic Acid Lvl: 136 ug/mL — ABNORMAL HIGH (ref 50.0–100.0)
Valproic Acid Lvl: 105 ug/mL — ABNORMAL HIGH (ref 50.0–100.0)

## 2021-11-25 LAB — MAGNESIUM: Magnesium: 2.2 mg/dL (ref 1.7–2.4)

## 2021-11-25 LAB — CBG MONITORING, ED: Glucose-Capillary: 81 mg/dL (ref 70–99)

## 2021-11-25 LAB — LITHIUM LEVEL: Lithium Lvl: 0.97 mmol/L (ref 0.60–1.20)

## 2021-11-25 MED ORDER — SODIUM CHLORIDE 0.9 % IV BOLUS
1000.0000 mL | Freq: Once | INTRAVENOUS | Status: AC
  Administered 2021-11-25: 1000 mL via INTRAVENOUS

## 2021-11-25 NOTE — ED Provider Notes (Signed)
[facility]-EMERGENCY DEPT Provider Note  CSN: [id number] Arrival date & time: 11/25/21 0741  Chief Complaint(s) Seizures  HPI Jack [name] is a 36 y.o. male with history of intellectual disability, schizoaffective disorder, seizure disorder presenting to the emergency department with seizure.  Per EMS, patient reportedly had a seizure at the group home and was confused afterwards.  Discussed with patient's mother over the phone who visits him frequently, reports he has overall been normal although has a "boil "in his groin.  No new medication changes.  History limited due to intellectual disability   Past Medical History Past Medical History:  Diagnosis Date   Bipolar 1 disorder (HCC)    Intellectual disability    Mental retardation    Schizophrenia, schizo-affective (HCC)    Seizures (HCC)    Patient Active Problem List   Diagnosis Date Noted   Normocytic anemia 06/26/2021   Thrombocytopenia (HCC) 06/26/2021   Sepsis (HCC)    Prostatitis    Abscess    Cellulitis 04/07/2021   Acute metabolic encephalopathy 04/07/2021   Urinary tract infection without hematuria    Autism 08/24/2017   Bipolar affective disorder, manic, severe (HCC) 01/29/2017   Agitation 03/09/2015   Bipolar 1 disorder, manic, moderate (HCC) 03/08/2015   Schizoaffective disorder, bipolar type (HCC) 01/10/2012   Intellectual disability 01/10/2012   Anxiety 01/10/2012   Constipation 10/29/2007   Home Medication(s) Prior to Admission medications   Medication Sig Start Date End Date Taking? Authorizing Provider  benztropine (COGENTIN) 0.5 MG tablet Take 0.5 mg by mouth at bedtime.    [provider]  cloZAPine (CLOZARIL) 100 MG tablet Take 1 tablet (100 mg total) by mouth 2 (two) times daily. For mood control Patient taking differently: Take 50-100 mg by mouth See admin instructions. Take 100 mg by mouth in the morning and afternoon & 50 mg at bedtime 02/20/17   Armandina Stammer I,  NP  divalproex (DEPAKOTE) 500 MG DR tablet Take 4 tablets (2,000 mg total) by mouth daily. For mood stabilization/seizures Patient taking differently: Take 2,000 mg by mouth See admin instructions. Take 2,000 mg by mouth at 4 PM daily 02/20/17   Armandina Stammer I, NP  doxycycline (VIBRAMYCIN) 100 MG capsule Take 1 capsule (100 mg total) by mouth 2 (two) times daily. 08/26/21   Jacalyn Lefevre, MD  doxycycline (VIBRAMYCIN) 100 MG capsule Take 1 capsule (100 mg total) by mouth 2 (two) times daily. 09/27/21   Vanetta Mulders, MD  gabapentin (NEURONTIN) 300 MG capsule Take 1 capsule (300 mg total) by mouth 3 (three) times daily. For agitation Patient taking differently: Take 300 mg by mouth 3 (three) times daily. 02/20/17   Sanjuana Kava, NP  HUMIRA PEN-CD/UC/HS STARTER 80 MG/0.8ML PNKT Inject 80 mg into the skin every 28 (twenty-eight) days. 08/04/21   [provider]  hydrOXYzine (VISTARIL) 50 MG capsule Take 50 mg by mouth 4 (four) times daily. 03/30/21   [provider]  lithium carbonate (ESKALITH) 450 MG CR tablet Take 2 tablets (900 mg total) by mouth daily. For mood stabilization Patient taking differently: Take 450 mg by mouth See admin instructions. Take 450 mg by mouth at 4 PM daily 02/20/17   Armandina Stammer I, NP  magnesium oxide (MAG-OX) 400 (240 Mg) MG tablet Take 400 mg by mouth in the morning.    [provider]  Past Surgical History Past Surgical History:  Procedure Laterality Date   APPENDECTOMY     TOOTH EXTRACTION N/A 03/12/2021   Procedure: DENTAL RESTORATION/EXTRACTIONS;  Surgeon: Ocie Doyne, DMD;  Location: [facility] OR;  Service: Oral Surgery;  Laterality: N/A;   Family History No family history on file.  Social History Social History   Tobacco Use   Smoking status: Never    Passive exposure: Never   Smokeless tobacco: Never   Vaping Use   Vaping Use: Never used  Substance Use Topics   Alcohol use: No   Drug use: No   Allergies Iodinated contrast media, Latex, and Shellfish-derived products  Review of Systems Review of Systems  Unable to perform ROS: Other    Physical Exam Vital Signs  I have reviewed the triage vital signs BP (!) 106/91   Pulse 86   Temp 97.6 F (36.4 C) (Oral)   Resp 16   SpO2 100%  Physical Exam Vitals and nursing note reviewed.  Constitutional:      General: He is not in acute distress.    Appearance: Normal appearance.  HENT:     Head: Normocephalic and atraumatic.     Mouth/Throat:     Mouth: Mucous membranes are moist.  Eyes:     Conjunctiva/sclera: Conjunctivae normal.  Cardiovascular:     Rate and Rhythm: Normal rate and regular rhythm.  Pulmonary:     Effort: Pulmonary effort is normal. No respiratory distress.     Breath sounds: Normal breath sounds.  Abdominal:     General: Abdomen is flat.     Palpations: Abdomen is soft.     Tenderness: There is no abdominal tenderness.  Musculoskeletal:     Right lower leg: No edema.     Left lower leg: No edema.  Skin:    General: Skin is warm and dry.     Capillary Refill: Capillary refill takes less than 2 seconds.  Neurological:     Mental Status: He is alert. Mental status is at baseline.     Comments: Oriented to self and that he is in the hospital.  Moving all extremities equally  Psychiatric:        Mood and Affect: Mood normal.        Behavior: Behavior normal.     ED Results and Treatments Labs (all labs ordered are listed, but only abnormal results are displayed) Labs Reviewed  CBC WITH DIFFERENTIAL/PLATELET - Abnormal; Notable for the following components:      Result Value   Platelets 146 (*)    All other components within normal limits  VALPROIC ACID LEVEL - Abnormal; Notable for the following components:   Valproic Acid Lvl 136 (*)    All other components within normal limits  VALPROIC  ACID LEVEL - Abnormal; Notable for the following components:   Valproic Acid Lvl 105 (*)    All other components within normal limits  COMPREHENSIVE METABOLIC PANEL  MAGNESIUM  LITHIUM LEVEL  AMMONIA  CBG MONITORING, ED  Radiology CT Head Wo Contrast  Result Date: 11/25/2021 CLINICAL DATA:  Mental status change, unknown cause. Witnessed seizure. EXAM: CT HEAD WITHOUT CONTRAST TECHNIQUE: Contiguous axial images were obtained from the base of the skull through the vertex without intravenous contrast. RADIATION DOSE REDUCTION: This exam was performed according to the departmental dose-optimization program which includes automated exposure control, adjustment of the mA and/or kV according to patient size and/or use of iterative reconstruction technique. COMPARISON:  CT head 09/27/2021 FINDINGS: Brain: There is no evidence of acute intracranial hemorrhage, mass lesion, brain edema or extra-axial fluid collection. The ventricles and subarachnoid spaces are appropriately sized for age. There is no CT evidence of acute cortical infarction. Vascular:  No hyperdense vessel identified. Skull: Negative for fracture or focal lesion. Sinuses/Orbits: Mild diffuse paranasal sinus mucosal thickening, primarily within the ethmoid and left maxillary sinuses, similar to previous study. The mastoid air cells and middle ears are clear. No sinus air-fluid levels or orbital abnormalities are identified. Other: None. IMPRESSION: Stable head CT without acute intracranial findings. Chronic paranasal sinus mucosal thickening. Electronically Signed   By: Carey BullocksWilliam  Veazey M.D.   On: 11/25/2021 09:24    Pertinent labs & imaging results that were available during my care of the patient were reviewed by me and considered in my medical decision making (see MDM for details).  Medications Ordered in ED Medications   sodium chloride 0.9 % bolus 1,000 mL (0 mLs Intravenous Stopped 11/25/21 1122)  sodium chloride 0.9 % bolus 1,000 mL (0 mLs Intravenous Stopped 11/25/21 1227)                                                                                                                                     Procedures Procedures  (including critical care time)  Medical Decision Making / ED Course   MDM:  36 year old male presenting to the emergency department after seizure in group home.  Patient seems to be at neurologic baseline and discussion with mother.  We will attempt to contact group home again.  No answer on first call.  Given baseline, history limited so will check head CT, basic labs including Depakote level.  Will check groin lesion.  If work-up unremarkable likely discharge back to group home.  Clinical Course as of 11/25/21 1519  Thu Nov 25, 2021  0831 GU exam chaperoned by NT Deejay. No obvious abscess or redness noted in the groin. Small cy [WS]  0940 Poison control recommends hydration and re-check level in 4 hours around 12.  [WS]  0941 Valproic Acid,S(!): 136 [WS]  1239 Valproic Acid,S(!): 105 [WS]    Clinical Course User Index [WS] Lonell GrandchildScheving, Dayan Kreis L, MD     Additional history obtained: -Additional history obtained from family -External records from outside source obtained and reviewed including: Chart review including previous notes, labs, imaging, consultation notes   Lab Tests: -I ordered, reviewed, and interpreted labs.   The pertinent results  include:   Labs Reviewed  CBC WITH DIFFERENTIAL/PLATELET - Abnormal; Notable for the following components:      Result Value   Platelets 146 (*)    All other components within normal limits  VALPROIC ACID LEVEL - Abnormal; Notable for the following components:   Valproic Acid Lvl 136 (*)    All other components within normal limits  VALPROIC ACID LEVEL - Abnormal; Notable for the following components:   Valproic Acid Lvl  105 (*)    All other components within normal limits  COMPREHENSIVE METABOLIC PANEL  MAGNESIUM  LITHIUM LEVEL  AMMONIA  CBG MONITORING, ED      EKG   EKG Interpretation  Date/Time:  Thursday November 25 2021 07:51:41 EDT Ventricular Rate:  85 PR Interval:  158 QRS Duration: 92 QT Interval:  388 QTC Calculation: 462 R Axis:   83 Text Interpretation: Sinus rhythm Nonspecific T abnormalities, diffuse leads No significant change since last tracing Confirmed by Garnette Gunner [phone]) on 11/25/2021 8:21:14 AM         Imaging Studies ordered: I ordered imaging studies including CT brain On my interpretation imaging demonstrates no acute process I independently visualized and interpreted imaging. I agree with the radiologist interpretation   Medicines ordered and prescription drug management: Meds ordered this encounter  Medications   sodium chloride 0.9 % bolus 1,000 mL   sodium chloride 0.9 % bolus 1,000 mL    -I have reviewed the patients home medicines and have made adjustments as needed   Consultations Obtained: I requested consultation with the poison control,  and discussed lab and imaging findings as well as pertinent plan - they recommend: repeat depakote level   Cardiac Monitoring: The patient was maintained on a cardiac monitor.  I personally viewed and interpreted the cardiac monitored which showed an underlying rhythm of: NSR  Social Determinants of Health:  Factors impacting patients care include: intellectual disability   Reevaluation: After the interventions noted above, I reevaluated the patient and found that they have improved  Co morbidities that complicate the patient evaluation  Past Medical History:  Diagnosis Date   Bipolar 1 disorder ([place])    Intellectual disability    Mental retardation    Schizophrenia, schizo-affective ([place])    Seizures ([place])       Dispostion: Discharge    Final Clinical Impression(s) / ED  Diagnoses Final diagnoses:  Unintentional overdose of valproate  Weakness     This chart was dictated using voice recognition software.  Despite best efforts to proofread,  errors can occur which can change the documentation meaning.    Cristie Hem, MD 11/25/21 [phone]

## 2021-11-25 NOTE — ED Notes (Signed)
This RN called pts mother, pt is being discharged and is medically cleared to return to group home. Pt mother requested that this RN call Reginold Agent (employee at group home) at [phone] to come pick up patient.  This RN spoke with Reginold Agent from the pts group home, Reginold Agent stated she was going to call pts mother and speak to her. Will follow up regarding pts transportation back to group home.

## 2021-11-25 NOTE — ED Triage Notes (Signed)
Pt bib ems from group home where pt had a witnessed seizure lasting approx 1 minute. Pt with hx of same. Pt postictal and so group home sent him here. Pt with no complaints. VSS with ems. Pt refused CBG check by ems.

## 2021-11-25 NOTE — ED Notes (Signed)
This RN called [name] from group home regarding pts discharge and transportation, per group home, pt mother is coming to pick up patient. This RN called pt mother, Jack [name], no answer. Will await pt mother to pick up pt.

## 2021-11-25 NOTE — ED Notes (Signed)
This RN spoke to [place], pts mother, per Alyse Low- pt sister, Caryl Pina, is on her way currently to pick up patient and take him back to group home.

## 2021-11-25 NOTE — Discharge Instructions (Addendum)
We evaluated your son for his weakness. His Depakote level was elevated, which can cause weakness. Dehydration can cause the level to increase. Please decrease his Depakote to 3 500 mg pills

## 2021-12-01 ENCOUNTER — Other Ambulatory Visit: Payer: Self-pay

## 2021-12-01 ENCOUNTER — Emergency Department (HOSPITAL_BASED_OUTPATIENT_CLINIC_OR_DEPARTMENT_OTHER): Payer: Medicare HMO

## 2021-12-01 ENCOUNTER — Emergency Department (HOSPITAL_BASED_OUTPATIENT_CLINIC_OR_DEPARTMENT_OTHER)
Admission: EM | Admit: 2021-12-01 | Discharge: 2021-12-01 | Disposition: A | Discharge status: Home / Self Care | Payer: Medicare HMO | Attending: Emergency Medicine | Admitting: Emergency Medicine

## 2021-12-01 ENCOUNTER — Encounter (HOSPITAL_BASED_OUTPATIENT_CLINIC_OR_DEPARTMENT_OTHER): Payer: Self-pay | Admitting: Emergency Medicine

## 2021-12-01 DIAGNOSIS — X58XXXA Exposure to other specified factors, initial encounter: Secondary | ICD-10-CM | POA: Diagnosis not present

## 2021-12-01 DIAGNOSIS — Z91040 Latex allergy status: Secondary | ICD-10-CM | POA: Diagnosis not present

## 2021-12-01 DIAGNOSIS — L03119 Cellulitis of unspecified part of limb: Secondary | ICD-10-CM | POA: Insufficient documentation

## 2021-12-01 DIAGNOSIS — S99922A Unspecified injury of left foot, initial encounter: Secondary | ICD-10-CM | POA: Diagnosis present

## 2021-12-01 DIAGNOSIS — S92322A Displaced fracture of second metatarsal bone, left foot, initial encounter for closed fracture: Secondary | ICD-10-CM | POA: Diagnosis not present

## 2021-12-01 LAB — CBC WITH DIFFERENTIAL/PLATELET
Abs Immature Granulocytes: 0.03 10*3/uL (ref 0.00–0.07)
Basophils Absolute: 0 10*3/uL (ref 0.0–0.1)
Basophils Relative: 0 %
Eosinophils Absolute: 0.1 10*3/uL (ref 0.0–0.5)
Eosinophils Relative: 2 %
HCT: 41.3 % (ref 39.0–52.0)
Hemoglobin: 13.4 g/dL (ref 13.0–17.0)
Immature Granulocytes: 0 %
Lymphocytes Relative: 41 %
Lymphs Abs: 2.9 10*3/uL (ref 0.7–4.0)
MCH: 29.8 pg (ref 26.0–34.0)
MCHC: 32.4 g/dL (ref 30.0–36.0)
MCV: 91.8 fL (ref 80.0–100.0)
Monocytes Absolute: 0.9 10*3/uL (ref 0.1–1.0)
Monocytes Relative: 12 %
Neutro Abs: 3.2 10*3/uL (ref 1.7–7.7)
Neutrophils Relative %: 45 %
Platelets: 160 10*3/uL (ref 150–400)
RBC: 4.5 MIL/uL (ref 4.22–5.81)
RDW: 14.1 % (ref 11.5–15.5)
WBC: 7.1 10*3/uL (ref 4.0–10.5)
nRBC: 0 % (ref 0.0–0.2)

## 2021-12-01 LAB — BASIC METABOLIC PANEL
Anion gap: 8 (ref 5–15)
BUN: 9 mg/dL (ref 6–20)
CO2: 27 mmol/L (ref 22–32)
Calcium: 9.6 mg/dL (ref 8.9–10.3)
Chloride: 105 mmol/L (ref 98–111)
Creatinine, Ser: 1.23 mg/dL (ref 0.61–1.24)
GFR, Estimated: >60 mL/min (ref >60)
Glucose, Bld: 96 mg/dL (ref 70–99)
Potassium: 3.6 mmol/L (ref 3.5–5.1)
Sodium: 140 mmol/L (ref 135–145)

## 2021-12-01 MED ORDER — CEFAZOLIN SODIUM-DEXTROSE 1-4 GM/50ML-% IV SOLN
1.0000 g | Freq: Once | INTRAVENOUS | Status: AC
  Administered 2021-12-01: 1 g via INTRAVENOUS
  Filled 2021-12-01: qty 50

## 2021-12-01 MED ORDER — SODIUM CHLORIDE 0.9 % IV SOLN: INTRAVENOUS | Status: DC | PRN

## 2021-12-01 NOTE — ED Provider Notes (Signed)
[facility] EMERGENCY DEPARTMENT Provider Note   CSN: [id number] Arrival date & time: 12/01/21  1613     History  Chief Complaint  Patient presents with   Cellulitis    Jack [name] is a 36 y.o. male.  Patient not wanting to ambulate on his left foot.  They noticed some redness on the top of the foot distally on the forefoot.  Patient's had trouble with cellulitis and infections in the past.  Temp upon arrival here 99.5.  No history of fall or injury.  No nausea vomiting or diarrhea.  No fever or chills noted.  Patient does stay at a group home mother visits frequently.  Past medical history significant for schizophrenia bipolar intellectual disability and history of seizures.       Home Medications Prior to Admission medications   Medication Sig Start Date End Date Taking? Authorizing Provider  benztropine (COGENTIN) 0.5 MG tablet Take 0.5 mg by mouth at bedtime.    [provider]  cloZAPine (CLOZARIL) 100 MG tablet Take 1 tablet (100 mg total) by mouth 2 (two) times daily. For mood control Patient taking differently: Take 50-100 mg by mouth See admin instructions. Take 100 mg by mouth in the morning and afternoon & 50 mg at bedtime 02/20/17   Armandina Stammer I, NP  divalproex (DEPAKOTE) 500 MG DR tablet Take 4 tablets (2,000 mg total) by mouth daily. For mood stabilization/seizures Patient taking differently: Take 2,000 mg by mouth See admin instructions. Take 2,000 mg by mouth at 4 PM daily 02/20/17   Armandina Stammer I, NP  doxycycline (VIBRAMYCIN) 100 MG capsule Take 1 capsule (100 mg total) by mouth 2 (two) times daily. 08/26/21   Jacalyn Lefevre, MD  doxycycline (VIBRAMYCIN) 100 MG capsule Take 1 capsule (100 mg total) by mouth 2 (two) times daily. 09/27/21   Vanetta Mulders, MD  gabapentin (NEURONTIN) 300 MG capsule Take 1 capsule (300 mg total) by mouth 3 (three) times daily. For agitation Patient taking differently: Take 300 mg by mouth 3 (three) times daily.  02/20/17   Sanjuana Kava, NP  HUMIRA PEN-CD/UC/HS STARTER 80 MG/0.8ML PNKT Inject 80 mg into the skin every 28 (twenty-eight) days. 08/04/21   [provider]  hydrOXYzine (VISTARIL) 50 MG capsule Take 50 mg by mouth 4 (four) times daily. 03/30/21   [provider]  lithium carbonate (ESKALITH) 450 MG CR tablet Take 2 tablets (900 mg total) by mouth daily. For mood stabilization Patient taking differently: Take 450 mg by mouth See admin instructions. Take 450 mg by mouth at 4 PM daily 02/20/17   Armandina Stammer I, NP  magnesium oxide (MAG-OX) 400 (240 Mg) MG tablet Take 400 mg by mouth in the morning.    [provider]      Allergies    Iodinated contrast media, Latex, and Shellfish-derived products    Review of Systems   Review of Systems  Constitutional:  Negative for chills and fever.  HENT:  Negative for ear pain and sore throat.   Eyes:  Negative for pain and visual disturbance.  Respiratory:  Negative for cough and shortness of breath.   Cardiovascular:  Negative for chest pain and palpitations.  Gastrointestinal:  Negative for abdominal pain and vomiting.  Genitourinary:  Negative for dysuria and hematuria.  Musculoskeletal:  Negative for arthralgias and back pain.  Skin:  Negative for color change, rash and wound.  Neurological:  Negative for seizures and syncope.  All other systems reviewed and are  negative.   Physical Exam Updated Vital Signs BP 125/79 (BP Location: Left Arm)   Pulse (!) 103   Temp 99.5 F (37.5 C) (Oral)   Resp 20   Ht 1.778 m (5\' 10" )   Wt 76.2 kg   SpO2 100%   BMI 24.11 kg/m  Physical Exam Vitals and nursing note reviewed.  Constitutional:      General: He is not in acute distress.    Appearance: Normal appearance. He is well-developed.  HENT:     Head: Normocephalic and atraumatic.     Mouth/Throat:     Mouth: Mucous membranes are moist.  Eyes:     Extraocular Movements: Extraocular movements intact.      Conjunctiva/sclera: Conjunctivae normal.     Pupils: Pupils are equal, round, and reactive to light.  Cardiovascular:     Rate and Rhythm: Normal rate and regular rhythm.     Heart sounds: No murmur heard. Pulmonary:     Effort: Pulmonary effort is normal. No respiratory distress.     Breath sounds: Normal breath sounds.  Abdominal:     Palpations: Abdomen is soft.     Tenderness: There is no abdominal tenderness.  Musculoskeletal:        General: Swelling and tenderness present.     Cervical back: Normal range of motion and neck supple.     Comments: Left foot anteriorly over the distal metatarsal area there is an area of erythema measuring about 3 x 4 cm.  Tender to palpate in those areas.  Good cap refill to the toes.  Dorsalis pedis pulses 2+.  No ankle swelling no proximal leg tenderness.  No evidence of any wound or abrasion on top of the foot or on the bottom of the foot.  Skin:    General: Skin is warm and dry.     Capillary Refill: Capillary refill takes less than 2 seconds.  Neurological:     Mental Status: He is alert. Mental status is at baseline.  Psychiatric:        Mood and Affect: Mood normal.     ED Results / Procedures / Treatments   Labs (all labs ordered are listed, but only abnormal results are displayed) Labs Reviewed  CBC WITH DIFFERENTIAL/PLATELET  BASIC METABOLIC PANEL    EKG None  Radiology DG Foot Complete Left  Addendum Date: 12/01/2021   ADDENDUM REPORT: 12/01/2021 18:39 ADDENDUM: Correction to report. Inadvertently used the word metacarpal for metatarsal. Findings and impression should read acute mildly displaced fracture at the neck of the second metatarsal. Electronically Signed   By: Donavan Foil M.D.   On: 12/01/2021 18:39   Result Date: 12/01/2021 CLINICAL DATA:  Cellulitis EXAM: LEFT FOOT - COMPLETE 3+ VIEW COMPARISON:  None Available. FINDINGS: No fracture or malalignment. No periostitis or osseous destructive change. Acute mildly  displaced fracture at the neck of the second metacarpal. Diffuse soft tissue swelling. IMPRESSION: 1. Acute appearing mildly displaced fracture at the neck of the second metacarpal 2. Soft tissue edema Electronically Signed: By: Donavan Foil M.D. On: 12/01/2021 18:02    Procedures Procedures    Medications Ordered in ED Medications  ceFAZolin (ANCEF) IVPB 1 g/50 mL premix (1 g Intravenous New Bag/Given 12/01/21 1828)  0.9 %  sodium chloride infusion ( Intravenous New Bag/Given 12/01/21 1826)    ED Course/ Medical Decision Making/ A&P  Medical Decision Making Amount and/or Complexity of Data Reviewed Labs: ordered. Radiology: ordered.  Risk Prescription drug management.   Clinically was concerning for maybe inflammatory process at the distal metatarsal area.  Also possibility of some cellulitis but no evidence of any wounds.  I blood cell count normal patient metabolic panel normal.  X-ray of the foot shows a mildly displaced fracture at the neck of the second metatarsal.  Not exactly sure how this occurred but this is probably an explanation for the pain.  Patient be treated with a walking boot.  Patient prior to this did receive 1 g of Ancef in case this was a cellulitis.  But does not need to continue with antibiotics.  They will follow-up with sports medicine set up an appointment upstairs and he will wear the walking boot.   Final Clinical Impression(s) / ED Diagnoses Final diagnoses:  Closed displaced fracture of second metatarsal bone of left foot, initial encounter    Rx / DC Orders ED Discharge Orders     None         Vanetta Mulders, MD 12/01/21 [id number]

## 2021-12-01 NOTE — ED Triage Notes (Signed)
Pt lives in group home , mother brought him to Ed for possible cellulitis left foot x 1 week . Recurrent issues . Hx admission for similar symptoms.  patient appears fatigued and sleepy x 2 days per mother

## 2021-12-01 NOTE — Discharge Instructions (Signed)
Wear the cam walker at all times.  Can take it off to shower.  He can appointment follow-up with sports medicine call and set up an appointment.  X-rays show a fracture of the foot where the redness is.  Clinically based on labs no concerns for cellulitis.

## 2021-12-06 ENCOUNTER — Ambulatory Visit: Payer: Medicare HMO | Admitting: Family Medicine

## 2021-12-10 DIAGNOSIS — T8859XA Other complications of anesthesia, initial encounter: Secondary | ICD-10-CM

## 2021-12-10 HISTORY — DX: Other complications of anesthesia, initial encounter: T88.59XA

## 2021-12-14 ENCOUNTER — Ambulatory Visit: Payer: Medicare HMO | Admitting: Sports Medicine

## 2022-07-21 NOTE — H&P (Signed)
Date: Jul 12, 2022   Patient: Jack [name]  PID: [id number]  DOB: 1985/12/19  SEX: Male   Patient referred by DDS for extraction teeth 6-11, 22-27.   CC: Bad teeth  Past Medical History:  Mental Disability, Epilepsy    Medications: Clozapine, Lithium, Benztropine, Divalproex, Gabapentin, Hydroxyzine    Allergies:     NKDA    Surgeries:   Appendectomy, Oral Surgery     Social History       Smoking:            Alcohol: Drug use:                             Exam: BMI  25. Bilateral maxillary buccal exostoses. Teeth 6, 7, 8, 9, 10, 11, 22, 23, 24, 25, 26, 27 only teeth remaining, with multiple caries.   No purulence, edema, fluctuance, trismus. Oral cancer screening negative. Pharynx clear. No lymphadenopathy.  Panorex:  Assessment: ASA 2. Non-restorable teeth # 6, 7, 8, 9, 10, 11, 22, 23, 24, 25, 26, 27, bilateral maxillary buccal exostoses.               Plan: Extraction Teeth #  6, 7, 8, 9, 10, 11, 22, 23, 24, 25, 26, 27, alveoloplasty, removal bilateral maxillary buccal exostoses.    Hospital Day surgery.                 Rx: n               Risks and complications explained. Questions answered.   Georgia Lopes, DMD

## 2022-07-22 ENCOUNTER — Encounter (HOSPITAL_COMMUNITY): Payer: Self-pay | Admitting: Oral Surgery

## 2022-07-22 ENCOUNTER — Other Ambulatory Visit: Payer: Self-pay

## 2022-07-22 NOTE — Progress Notes (Addendum)
I spoke to [employer] mother - Jack [name].  Ms Tilda Franco reports that Tiofilo has not complained of shortness of breath or chest pain.  Patient denies having any s/s of Covid in her household, also denies any known exposure to Covid,  and no s/s of upper or lower respiratory in the past 8 weeks.   Lam's PCP is  Loura Back.  [place] had a flair of  Hidradenitis earlier in the week, it is under his right arm, the area has drained and is slowing down, Ms Tilda Franco said drainage should be completed by Monday.  [place] has a history of seizures- petit mal, stares, last 10 minutes or less.  Last one was ~2 months ago.

## 2022-07-24 NOTE — Anesthesia Preprocedure Evaluation (Addendum)
Anesthesia Evaluation  Patient identified by MRN, date of birth, ID band Patient awake    Reviewed: Allergy & Precautions, NPO status , Patient's Chart, lab work & pertinent test results  History of Anesthesia Complications (+) history of anesthetic complications (slow to wake up)  Airway Mallampati: III   Neck ROM: Full   Comment: Previous grade I view with MAC 4 Dental  (+) Poor Dentition, Dental Advisory Given   Pulmonary neg pulmonary ROS   Pulmonary exam normal breath sounds clear to auscultation       Cardiovascular negative cardio ROS  Rhythm:Regular Rate:Normal     Neuro/Psych Seizures - (last seizure 3 months ago, petit mal),  PSYCHIATRIC DISORDERS (MR, ADHD, autism) Anxiety  Bipolar Disorder Schizophrenia Dementia    GI/Hepatic negative GI ROS, Neg liver ROS,,,  Endo/Other  negative endocrine ROS    Renal/GU negative Renal ROS     Musculoskeletal   Abdominal   Peds  Hematology  (+) Blood dyscrasia, anemia   Anesthesia Other Findings   Reproductive/Obstetrics                             Anesthesia Physical Anesthesia Plan  ASA: 3  Anesthesia Plan: General   Post-op Pain Management: Ofirmev IV (intra-op)*   Induction: Intravenous  PONV Risk Score and Plan: 2 and Ondansetron, Dexamethasone and Treatment may vary due to age or medical condition  Airway Management Planned: Nasal ETT  Additional Equipment:   Intra-op Plan:   Post-operative Plan: Extubation in OR  Informed Consent: I have reviewed the patients History and Physical, chart, labs and discussed the procedure including the risks, benefits and alternatives for the proposed anesthesia with the patient or authorized representative who has indicated his/her understanding and acceptance.     Dental advisory given  Plan Discussed with: CRNA and Anesthesiologist  Anesthesia Plan Comments: (Risks of general  anesthesia discussed including, but not limited to, sore throat, hoarse voice, chipped/damaged teeth, injury to vocal cords, nausea and vomiting, allergic reactions, lung infection, heart attack, stroke, and death. All questions answered. )       Anesthesia Quick Evaluation

## 2022-07-25 ENCOUNTER — Encounter (HOSPITAL_COMMUNITY)
Admission: RE | Disposition: A | Discharge status: Home / Self Care | Payer: Self-pay | Source: Home / Self Care | Attending: Oral Surgery

## 2022-07-25 ENCOUNTER — Encounter (HOSPITAL_COMMUNITY): Payer: Self-pay | Admitting: Oral Surgery

## 2022-07-25 ENCOUNTER — Ambulatory Visit (HOSPITAL_COMMUNITY)
Admission: RE | Admit: 2022-07-25 | Discharge: 2022-07-25 | Disposition: A | Discharge status: Home / Self Care | Payer: Medicare HMO | Attending: Oral Surgery | Admitting: Oral Surgery

## 2022-07-25 ENCOUNTER — Ambulatory Visit (HOSPITAL_BASED_OUTPATIENT_CLINIC_OR_DEPARTMENT_OTHER): Payer: Medicare HMO | Admitting: Anesthesiology

## 2022-07-25 ENCOUNTER — Ambulatory Visit (HOSPITAL_COMMUNITY): Payer: Medicare HMO | Admitting: Anesthesiology

## 2022-07-25 ENCOUNTER — Other Ambulatory Visit: Payer: Self-pay

## 2022-07-25 DIAGNOSIS — M278 Other specified diseases of jaws: Secondary | ICD-10-CM

## 2022-07-25 DIAGNOSIS — G40909 Epilepsy, unspecified, not intractable, without status epilepticus: Secondary | ICD-10-CM | POA: Diagnosis not present

## 2022-07-25 DIAGNOSIS — F319 Bipolar disorder, unspecified: Secondary | ICD-10-CM | POA: Insufficient documentation

## 2022-07-25 DIAGNOSIS — F0394 Unspecified dementia, unspecified severity, with anxiety: Secondary | ICD-10-CM

## 2022-07-25 DIAGNOSIS — K0889 Other specified disorders of teeth and supporting structures: Secondary | ICD-10-CM | POA: Diagnosis not present

## 2022-07-25 DIAGNOSIS — F909 Attention-deficit hyperactivity disorder, unspecified type: Secondary | ICD-10-CM | POA: Diagnosis not present

## 2022-07-25 DIAGNOSIS — F419 Anxiety disorder, unspecified: Secondary | ICD-10-CM | POA: Diagnosis not present

## 2022-07-25 DIAGNOSIS — D649 Anemia, unspecified: Secondary | ICD-10-CM | POA: Diagnosis not present

## 2022-07-25 DIAGNOSIS — K029 Dental caries, unspecified: Secondary | ICD-10-CM | POA: Diagnosis present

## 2022-07-25 HISTORY — PX: TOOTH EXTRACTION: SHX859

## 2022-07-25 HISTORY — DX: Unspecified dementia, unspecified severity, without behavioral disturbance, psychotic disturbance, mood disturbance, and anxiety: F03.90

## 2022-07-25 HISTORY — DX: Other amnesia: R41.3

## 2022-07-25 HISTORY — DX: Hidradenitis suppurativa: L73.2

## 2022-07-25 HISTORY — DX: Attention-deficit hyperactivity disorder, unspecified type: F90.9

## 2022-07-25 LAB — CBC
HCT: 38.2 % — ABNORMAL LOW (ref 39.0–52.0)
Hemoglobin: 12 g/dL — ABNORMAL LOW (ref 13.0–17.0)
MCH: 28.8 pg (ref 26.0–34.0)
MCHC: 31.4 g/dL (ref 30.0–36.0)
MCV: 91.8 fL (ref 80.0–100.0)
Platelets: 272 10*3/uL (ref 150–400)
RBC: 4.16 MIL/uL — ABNORMAL LOW (ref 4.22–5.81)
RDW: 13.3 % (ref 11.5–15.5)
WBC: 6.9 10*3/uL (ref 4.0–10.5)
nRBC: 0 % (ref 0.0–0.2)

## 2022-07-25 LAB — BASIC METABOLIC PANEL
Anion gap: 12 (ref 5–15)
BUN: 6 mg/dL (ref 6–20)
CO2: 24 mmol/L (ref 22–32)
Calcium: 9.5 mg/dL (ref 8.9–10.3)
Chloride: 102 mmol/L (ref 98–111)
Creatinine, Ser: 1.11 mg/dL (ref 0.61–1.24)
GFR, Estimated: >60 mL/min (ref >60)
Glucose, Bld: 86 mg/dL (ref 70–99)
Potassium: 4.3 mmol/L (ref 3.5–5.1)
Sodium: 138 mmol/L (ref 135–145)

## 2022-07-25 SURGERY — DENTAL RESTORATION/EXTRACTIONS
Anesthesia: General

## 2022-07-25 MED ORDER — GLYCOPYRROLATE PF 0.2 MG/ML IJ SOSY
PREFILLED_SYRINGE | INTRAMUSCULAR | Status: AC
  Filled 2022-07-25: qty 2

## 2022-07-25 MED ORDER — DEXAMETHASONE SODIUM PHOSPHATE 10 MG/ML IJ SOLN
INTRAMUSCULAR | Status: DC | PRN
  Administered 2022-07-25: 10 mg via INTRAVENOUS

## 2022-07-25 MED ORDER — AMOXICILLIN 500 MG PO CAPS: 500.0000 mg | ORAL_CAPSULE | Freq: Three times a day (TID) | ORAL | 0 refills | Status: DC

## 2022-07-25 MED ORDER — ONDANSETRON HCL 4 MG/2ML IJ SOLN
INTRAMUSCULAR | Status: DC | PRN
  Administered 2022-07-25: 4 mg via INTRAVENOUS

## 2022-07-25 MED ORDER — LACTATED RINGERS IV SOLN: INTRAVENOUS | Status: DC

## 2022-07-25 MED ORDER — MIDAZOLAM HCL 2 MG/2ML IJ SOLN
INTRAMUSCULAR | Status: AC
  Filled 2022-07-25: qty 2

## 2022-07-25 MED ORDER — ORAL CARE MOUTH RINSE: 15.0000 mL | Freq: Once | OROMUCOSAL | Status: AC

## 2022-07-25 MED ORDER — OXYMETAZOLINE HCL 0.05 % NA SOLN
NASAL | Status: DC | PRN
  Administered 2022-07-25: 2 via NASAL

## 2022-07-25 MED ORDER — CHLORHEXIDINE GLUCONATE 0.12 % MT SOLN
OROMUCOSAL | Status: AC
  Filled 2022-07-25: qty 15

## 2022-07-25 MED ORDER — CEFAZOLIN SODIUM-DEXTROSE 2-4 GM/100ML-% IV SOLN
2.0000 g | INTRAVENOUS | Status: AC
  Administered 2022-07-25: 2 g via INTRAVENOUS
  Filled 2022-07-25: qty 100

## 2022-07-25 MED ORDER — CHLORHEXIDINE GLUCONATE 0.12 % MT SOLN: 15.0000 mL | Freq: Once | OROMUCOSAL | Status: AC

## 2022-07-25 MED ORDER — LIDOCAINE 2% (20 MG/ML) 5 ML SYRINGE
INTRAMUSCULAR | Status: AC
  Filled 2022-07-25: qty 10

## 2022-07-25 MED ORDER — FENTANYL CITRATE (PF) 250 MCG/5ML IJ SOLN
INTRAMUSCULAR | Status: AC
  Filled 2022-07-25: qty 5

## 2022-07-25 MED ORDER — ACETAMINOPHEN 10 MG/ML IV SOLN
INTRAVENOUS | Status: AC
  Filled 2022-07-25: qty 100

## 2022-07-25 MED ORDER — LIDOCAINE 2% (20 MG/ML) 5 ML SYRINGE
INTRAMUSCULAR | Status: DC | PRN
  Administered 2022-07-25: 100 mg via INTRAVENOUS

## 2022-07-25 MED ORDER — DEXAMETHASONE SODIUM PHOSPHATE 10 MG/ML IJ SOLN
INTRAMUSCULAR | Status: AC
  Filled 2022-07-25: qty 2

## 2022-07-25 MED ORDER — ROCURONIUM BROMIDE 10 MG/ML (PF) SYRINGE
PREFILLED_SYRINGE | INTRAVENOUS | Status: DC | PRN
  Administered 2022-07-25: 50 mg via INTRAVENOUS

## 2022-07-25 MED ORDER — PHENYLEPHRINE 80 MCG/ML (10ML) SYRINGE FOR IV PUSH (FOR BLOOD PRESSURE SUPPORT)
PREFILLED_SYRINGE | INTRAVENOUS | Status: AC
  Filled 2022-07-25: qty 30

## 2022-07-25 MED ORDER — ONDANSETRON HCL 4 MG/2ML IJ SOLN
INTRAMUSCULAR | Status: AC
  Filled 2022-07-25: qty 4

## 2022-07-25 MED ORDER — 0.9 % SODIUM CHLORIDE (POUR BTL) OPTIME
TOPICAL | Status: DC | PRN
  Administered 2022-07-25: 1000 mL

## 2022-07-25 MED ORDER — OXYCODONE-ACETAMINOPHEN 5-325 MG PO TABS: 1.0000 | ORAL_TABLET | ORAL | 0 refills | Status: DC | PRN

## 2022-07-25 MED ORDER — GLYCOPYRROLATE PF 0.2 MG/ML IJ SOSY
PREFILLED_SYRINGE | INTRAMUSCULAR | Status: DC | PRN
  Administered 2022-07-25: .1 mg via INTRAVENOUS

## 2022-07-25 MED ORDER — FENTANYL CITRATE (PF) 100 MCG/2ML IJ SOLN: 25.0000 ug | INTRAMUSCULAR | Status: DC | PRN

## 2022-07-25 MED ORDER — LIDOCAINE-EPINEPHRINE 2 %-1:100000 IJ SOLN
INTRAMUSCULAR | Status: AC
  Filled 2022-07-25: qty 1

## 2022-07-25 MED ORDER — OXYCODONE HCL 5 MG/5ML PO SOLN: 5.0000 mg | Freq: Once | ORAL | Status: DC | PRN

## 2022-07-25 MED ORDER — SODIUM CHLORIDE 0.9 % IR SOLN
Status: DC | PRN
  Administered 2022-07-25: 1000 mL

## 2022-07-25 MED ORDER — LIDOCAINE-EPINEPHRINE 2 %-1:100000 IJ SOLN
INTRAMUSCULAR | Status: DC | PRN
  Administered 2022-07-25: 5 mL
  Administered 2022-07-25: 20 mL

## 2022-07-25 MED ORDER — ROCURONIUM BROMIDE 10 MG/ML (PF) SYRINGE
PREFILLED_SYRINGE | INTRAVENOUS | Status: AC
  Filled 2022-07-25: qty 20

## 2022-07-25 MED ORDER — SUGAMMADEX SODIUM 200 MG/2ML IV SOLN
INTRAVENOUS | Status: DC | PRN
  Administered 2022-07-25: 200 mg via INTRAVENOUS

## 2022-07-25 MED ORDER — PROPOFOL 10 MG/ML IV BOLUS
INTRAVENOUS | Status: DC | PRN
  Administered 2022-07-25: 200 mg via INTRAVENOUS

## 2022-07-25 MED ORDER — AMISULPRIDE (ANTIEMETIC) 5 MG/2ML IV SOLN: 10.0000 mg | Freq: Once | INTRAVENOUS | Status: DC | PRN

## 2022-07-25 MED ORDER — PROPOFOL 10 MG/ML IV BOLUS
INTRAVENOUS | Status: AC
  Filled 2022-07-25: qty 20

## 2022-07-25 MED ORDER — FENTANYL CITRATE (PF) 250 MCG/5ML IJ SOLN
INTRAMUSCULAR | Status: DC | PRN
  Administered 2022-07-25: 50 ug via INTRAVENOUS

## 2022-07-25 MED ORDER — OXYCODONE HCL 5 MG PO TABS: 5.0000 mg | ORAL_TABLET | Freq: Once | ORAL | Status: DC | PRN

## 2022-07-25 MED ORDER — ACETAMINOPHEN 10 MG/ML IV SOLN
INTRAVENOUS | Status: DC | PRN
  Administered 2022-07-25: 1000 mg via INTRAVENOUS

## 2022-07-25 SURGICAL SUPPLY — 38 items
BAG COUNTER SPONGE SURGICOUNT (BAG) IMPLANT
BAG SPNG CNTER NS LX DISP (BAG)
BLADE SURG 15 STRL LF DISP TIS (BLADE) ×1 IMPLANT
BLADE SURG 15 STRL SS (BLADE) ×1
BUR CROSS CUT FISSURE 1.6 (BURR) ×1 IMPLANT
BUR EGG ELITE 4.0 (BURR) ×1 IMPLANT
CANISTER SUCT 3000ML PPV (MISCELLANEOUS) ×1 IMPLANT
COVER SURGICAL LIGHT HANDLE (MISCELLANEOUS) ×1 IMPLANT
GAUZE PACKING FOLDED 2  STR (GAUZE/BANDAGES/DRESSINGS) ×1
GAUZE PACKING FOLDED 2 STR (GAUZE/BANDAGES/DRESSINGS) ×1 IMPLANT
GLOVE BIO SURGEON STRL SZ 6.5 (GLOVE) IMPLANT
GLOVE BIO SURGEON STRL SZ7 (GLOVE) IMPLANT
GLOVE BIO SURGEON STRL SZ8 (GLOVE) ×1 IMPLANT
GLOVE BIOGEL PI IND STRL 6.5 (GLOVE) IMPLANT
GLOVE BIOGEL PI IND STRL 7.0 (GLOVE) IMPLANT
GLOVE BIOGEL PI IND STRL 7.5 (GLOVE) IMPLANT
GLOVE BIOGEL PI IND STRL 8 (GLOVE) IMPLANT
GOWN STRL REUS W/ TWL LRG LVL3 (GOWN DISPOSABLE) ×1 IMPLANT
GOWN STRL REUS W/ TWL XL LVL3 (GOWN DISPOSABLE) ×1 IMPLANT
GOWN STRL REUS W/TWL LRG LVL3 (GOWN DISPOSABLE) ×1
GOWN STRL REUS W/TWL XL LVL3 (GOWN DISPOSABLE) ×1
IV NS 1000ML (IV SOLUTION) ×1
IV NS 1000ML BAXH (IV SOLUTION) ×1 IMPLANT
KIT BASIN OR (CUSTOM PROCEDURE TRAY) ×1 IMPLANT
KIT TURNOVER KIT B (KITS) ×1 IMPLANT
NDL HYPO 25GX1X1/2 BEV (NEEDLE) ×2 IMPLANT
NEEDLE HYPO 25GX1X1/2 BEV (NEEDLE) ×2 IMPLANT
NS IRRIG 1000ML POUR BTL (IV SOLUTION) ×1 IMPLANT
PAD ARMBOARD 7.5X6 YLW CONV (MISCELLANEOUS) ×1 IMPLANT
SLEEVE IRRIGATION ELITE 7 (MISCELLANEOUS) ×1 IMPLANT
SPIKE FLUID TRANSFER (MISCELLANEOUS) ×1 IMPLANT
SPONGE SURGIFOAM ABS GEL 12-7 (HEMOSTASIS) IMPLANT
SUT CHROMIC 3 0 PS 2 (SUTURE) ×1 IMPLANT
SYR BULB IRRIG 60ML STRL (SYRINGE) ×1 IMPLANT
SYR CONTROL 10ML LL (SYRINGE) ×1 IMPLANT
TRAY ENT MC OR (CUSTOM PROCEDURE TRAY) ×1 IMPLANT
TUBING IRRIGATION (MISCELLANEOUS) ×1 IMPLANT
YANKAUER SUCT BULB TIP NO VENT (SUCTIONS) ×1 IMPLANT

## 2022-07-25 NOTE — H&P (Signed)
H&P documentation  -History and Physical Reviewed  -Patient has been re-examined  -No change in the plan of care  Jack [name]  

## 2022-07-25 NOTE — Op Note (Signed)
07/25/2022  10:41 AM  PATIENT:  Jack [name]  37 y.o. male  PRE-OPERATIVE DIAGNOSIS:  non restorable teeth # 6, 7, 8, 9, 10, 11, 22, 23, 24, 25, 26, 27, bilateral maxillary buccal exostoses  POST-OPERATIVE DIAGNOSIS:  SAME  PROCEDURE:  Procedure(s): EXTRACTIONS  teeth # 6, 7, 8, 9, 10, 11, 22, 23, 24, 25, 26, 27, alveoloplasty right and left maxilla and mandible, removal bilateral maxillary buccal exostoses  SURGEON:  Surgeon(s): Ocie Doyne, DMD  ANESTHESIA:   local and general  EBL:  minimal  DRAINS: none   SPECIMEN:  No Specimen  COUNTS:  YES  PLAN OF CARE: Discharge to home after PACU  PATIENT DISPOSITION:  PACU - hemodynamically stable.   PROCEDURE DETAILS: Dictation # [id number]  Georgia Lopes, DMD 07/25/2022 10:41 AM

## 2022-07-25 NOTE — Transfer of Care (Signed)
Immediate Anesthesia Transfer of Care Note  Patient: Jack [name]  Procedure(s) Performed: DENTAL RESTORATION/EXTRACTIONS  Patient Location: PACU  Anesthesia Type:General  Level of Consciousness: sedated  Airway & Oxygen Therapy: Patient Spontanous Breathing and Patient connected to face mask oxygen  Post-op Assessment: Report given to RN and Post -op Vital signs reviewed and stable  Post vital signs: Reviewed and stable  Last Vitals:  Vitals Value Taken Time  BP 139/97 07/25/22 1052  Temp    Pulse 88 07/25/22 1053  Resp 9 07/25/22 1053  SpO2 100 % 07/25/22 1053  Vitals shown include unvalidated device data.  Last Pain:  Vitals:   07/25/22 0646  TempSrc: Oral         Complications: No notable events documented.

## 2022-07-25 NOTE — Anesthesia Procedure Notes (Signed)
Procedure Name: Intubation Date/Time: 07/25/2022 9:36 AM  Performed by: Loleta Erik Nessel, CRNAPre-anesthesia Checklist: Patient identified, Patient being monitored, Timeout performed, Emergency Drugs available and Suction available Patient Re-evaluated:Patient Re-evaluated prior to induction Oxygen Delivery Method: Circle system utilized Preoxygenation: Pre-oxygenation with 100% oxygen Induction Type: IV induction Ventilation: Mask ventilation without difficulty Laryngoscope Size: Glidescope and 4 Grade View: Grade I Tube type: Oral Nasal Tubes: Right, Nasal prep performed, Nasal Rae and Magill forceps- large, utilized Tube size: 7.5 mm Number of attempts: 1 Airway Equipment and Method: Stylet Placement Confirmation: ETT inserted through vocal cords under direct vision, positive ETCO2 and breath sounds checked- equal and bilateral Secured at: 22 cm Tube secured with: Tape Dental Injury: Teeth and Oropharynx as per pre-operative assessment

## 2022-07-25 NOTE — Op Note (Signed)
Jack [name], [name] MEDICAL RECORD NO: [id number] ACCOUNT NO: [id number] DATE OF BIRTH: 07/30/1985 FACILITY: [facility] LOCATION: [facility] PHYSICIAN: Georgia Lopes, DDS  Operative Report   DATE OF PROCEDURE: 07/25/2022  PREOPERATIVE DIAGNOSES:  Nonrestorable teeth numbers 6, 7, 8, 9, 10, 11, 22, 23, 24, 25, 26, 27 secondary to dental caries and bilateral maxillary buccal exostoses.  POSTOPERATIVE DIAGNOSES:  Nonrestorable teeth numbers 6, 7, 8, 9, 10, 11, 22, 23, 24, 25, 26, 27 secondary to dental caries, and bilateral maxillary buccal exostoses.  PROCEDURE:  Extraction teeth numbers 6, 7, 8, 9, 10, 11, 22, 23, 24, 25, 26, 27; alveoloplasty right and left maxilla and mandible; removal bilateral maxillary buccal exostoses.  SURGEON:  Georgia Lopes, DDS  ANESTHESIA:  General, nasal intubation, Dr. Freida Busman attending.  DESCRIPTION OF PROCEDURE:  The patient was taken to the operating room and placed on the table in supine position.  General anesthesia was administered.  Nasoendotracheal tube was placed and secured.  The eyes were protected and the patient was draped  for surgery.  Timeout was performed.  The posterior pharynx was suctioned and a throat pack was placed.  2% lidocaine 1:100,000 epinephrine was infiltrated in a right and left inferior alveolar nerve block and buccally in the anterior mandible.   Then, local anesthesia was administered buccally and palatally around teeth #6 through 11 and around the buccal exostoses which were proximal to the anterior teeth.  A bite block was placed on the right side of the mouth.  A sweetheart retractor was used  to retract the tongue.  A #15 blade was used to make an incision on the alveolar crest approximately 1 cm proximal to tooth #22 carried forward in the buccal and lingual sulcus until tooth #27 was encountered.  The periosteum was reflected with a  periosteal elevator and then the 301 elevator was used to elevate the teeth.  Teeth numbers 24  and 25 and 26 were removed using the Ash forceps.  Tooth #22 and 23 fractured upon attempted removal, necessitating removal of interproximal bone and  circumferential bone using the Stryker handpiece with fissure bur under irrigation. Then, these teeth were removed with 301 elevator and dental forceps.  Then, the sockets were curetted.  The periosteum was reflected to expose the alveolar crest, which  was irregular in contour.  Alveoloplasty was performed using the egg bur and the Stryker handpiece under irrigation followed by the bone file.  Then, the teeth numbers 22 through 26 area was closed with 3-0 chromic.  The attention was turned to the  maxilla, the 15 blade was used to make an incision in the molar region in the alveolar edentulous crest of the ridge and carried forward to tooth #11.  Then, it was taken buccally and palatally in the gingival sulcus until tooth #6 was encountered.  The  periosteum was reflected.  The teeth were elevated and teeth numbers 9, 8, 7 were removed with the dental forceps.  Teeth numbers 10 and 11 required removal of circumferential bone and interproximal bone with a Stryker handpiece and fissure bur and then  the teeth were elevated and removed with the dental forceps.  All teeth removed had bulbous roots and proved difficult to remove. Then, the periosteum was reflected to expose the alveolar crest.  The sockets were curetted.  Alveoloplasty was performed  using the egg bur followed by the bone file.  Then, the left maxilla was irrigated and closed with 3-0 chromic.  Then, the bite  block was repositioned to the other side of the mouth.  A 15 blade was used to make an incision in the area of tooth # 2 and  the maxilla carried forward along the alveolar crest to tooth #6 and in the mandible, the incision was made 1 cm proximal to tooth #27 and carried forward around tooth #27.  The periosteum was reflected in the mandible. Tooth #27 was removed using the  Stryker  handpiece with fissure bur to remove circumferential bone, so that the teeth could be elevated and removed with the dental forceps.  Then, the maxilla was operated.  Tooth #6 was removed using the Stryker handpiece to remove bone around this  tooth and then the tooth was elevated and removed with the dental forceps.  Then, alveoplasty was performed in the right maxilla and the buccal exostosis was removed using the egg bur and the Stryker handpiece under irrigation and then alveoplasty was  performed in the right mandible to smooth the bone and any irregular contour.  Then, the bone file was used to further smooth the right maxilla and mandible and then the area was closed with 3-0 chromic.  The anterior frenum was repositioned at the crest  of the maxilla midline at the ridge owing to bone reduction and soft tissue removal.  A frenectomy was then performed using the #15 blade to make the incision at the papilla and it was reflected superiorly and then excised and then the area was closed  with 3-0 chromic.  The oral cavity was then irrigated and suctioned.  The throat pack was removed.  The patient was left under the care of anesthesia for extubation and transport to recovery room with plans for discharge home through day surgery.  ESTIMATED BLOOD LOSS:  Minimum.  COMPLICATIONS:  None.  SPECIMENS:  None.  COUNTS:  Correct.   NIK D: 07/25/2022 10:48:00 am T: 07/25/2022 1:01:00 pm  JOB: [id number]/ [id number]

## 2022-07-25 NOTE — Anesthesia Postprocedure Evaluation (Signed)
Anesthesia Post Note  Patient: Jack [name]  Procedure(s) Performed: DENTAL RESTORATION/EXTRACTIONS     Patient location during evaluation: PACU Anesthesia Type: General Level of consciousness: awake Pain management: pain level controlled Vital Signs Assessment: post-procedure vital signs reviewed and stable Respiratory status: spontaneous breathing, nonlabored ventilation and respiratory function stable Cardiovascular status: blood pressure returned to baseline and stable Postop Assessment: no apparent nausea or vomiting Anesthetic complications: no   No notable events documented.  Last Vitals:  Vitals:   07/25/22 1215 07/25/22 1230  BP: (!) 121/98 (!) 125/90  Pulse: 87 90  Resp: 16 12  Temp:    SpO2: 100% 98%    Last Pain:  Vitals:   07/25/22 1215  TempSrc:   PainSc: Asleep                 Linton Rump

## 2022-07-26 ENCOUNTER — Encounter (HOSPITAL_COMMUNITY): Payer: Self-pay | Admitting: Oral Surgery

## 2022-08-22 ENCOUNTER — Encounter: Payer: Self-pay | Admitting: Neurology

## 2022-09-19 ENCOUNTER — Ambulatory Visit (INDEPENDENT_AMBULATORY_CARE_PROVIDER_SITE_OTHER): Payer: Medicare HMO | Admitting: Neurology

## 2022-09-19 ENCOUNTER — Encounter: Payer: Self-pay | Admitting: Neurology

## 2022-09-19 VITALS — BP 128/81 | HR 111 | Ht 71.0 in | Wt 207.2 lb

## 2022-09-19 DIAGNOSIS — G40209 Localization-related (focal) (partial) symptomatic epilepsy and epileptic syndromes with complex partial seizures, not intractable, without status epilepticus: Secondary | ICD-10-CM
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
## 2022-09-19 NOTE — Patient Instructions (Signed)
Good to meet you. Continue all your medications. Recommend bone density scan every 3 years while on long-term Depakote. Follow-up in 6 months, call for any changes.   Seizure Precautions: 1. If medication has been prescribed for you to prevent seizures, take it exactly as directed.  Do not stop taking the medicine without talking to your doctor first, even if you have not had a seizure in a long time.   2. Avoid activities in which a seizure would cause danger to yourself or to others.  Don't operate dangerous machinery, swim alone, or climb in high or dangerous places, such as on ladders, roofs, or girders.  Do not drive unless your doctor says you may.  3. If you have any warning that you may have a seizure, lay down in a safe place where you can't hurt yourself.    4.  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   Please refer to the following link on the [employer]'s website for more information: http://www.epilepsyfoundation.org/answerplace/Social/driving/drivingu.cfm   5.  Maintain good sleep hygiene.  6.  Contact your doctor if you have any problems that may be related to the medicine you are taking.  7.  Call 911 and bring the patient back to the ED if:        A.  The seizure lasts longer than 5 minutes.       B.  The patient doesn't awaken shortly after the seizure  C.  The patient has new problems such as difficulty seeing, speaking or moving  D.  The patient was injured during the seizure  E.  The patient has a temperature over 102 F (39C)  F.  The patient vomited and now is having trouble breathing

## 2022-10-04 ENCOUNTER — Encounter: Payer: Self-pay | Admitting: Physician Assistant

## 2022-10-04 NOTE — Progress Notes (Unsigned)
Spoke with Call Center at 4:46 pm, Ashok Pall from Dermatologist Office had called [place] regarding this patient,, Call center tried to call back but there was no answer.  Ashby Dawes of the call is unclear. Call Center was instructed to call us back if emergent.

## 2022-10-05 ENCOUNTER — Encounter: Payer: Self-pay | Admitting: Physician Assistant

## 2022-10-05 NOTE — Progress Notes (Signed)
Was informed that the paging service called me again however there is no record on my phone.

## 2022-10-06 ENCOUNTER — Telehealth: Payer: Self-pay | Admitting: Neurology

## 2022-10-06 NOTE — Telephone Encounter (Signed)
Physician assistant called from [facility] about this pt asking to speak with Dr Karel Jarvis. Wants to discuss medications and medication monitoring

## 2022-10-12 NOTE — Telephone Encounter (Signed)
Called number provided [phone], it was a wrong number.  Called office in [facility] office number, left VM to let them know I tried to get in touch.

## 2022-10-19 ENCOUNTER — Telehealth: Payer: Self-pay | Admitting: Neurology

## 2022-10-19 NOTE — Telephone Encounter (Signed)
Called [phone], left VM

## 2022-11-02 ENCOUNTER — Telehealth: Payer: Self-pay | Admitting: Neurology

## 2022-11-02 NOTE — Telephone Encounter (Signed)
Left VM

## 2022-11-02 NOTE — Telephone Encounter (Signed)
Jack [name] , PA  from a Dermatology office that sees the mutual patient . Is needing to speak with dr. Allena Katz. Provider left her cell phone number and work number : [phone] and office [phone]

## 2022-11-02 NOTE — Telephone Encounter (Signed)
Left VM on cell number

## 2022-11-04 NOTE — Telephone Encounter (Signed)
Called cell, goes straight to VM, left another message.

## 2022-11-08 NOTE — Telephone Encounter (Signed)
Spoke to Derm,they want to restart the Humira. When he was on it before, his Depakote and Lithium levels went askew. Needs level to be checked more often. Discussed that would check Depakote level prior to starting Humira, then 2 weeks after, then monthly thereafter. Also discussed that our office does not manage his Lithium, would need to let PCP/Psychiatry know about that. Records reviewed, we don't have any records of his levels, mother reported to Derm that they are checked monthly. His PCP is not on [facility] to review records/labs. Ms Ward will let mother and PCP know.

## 2022-11-15 NOTE — Telephone Encounter (Signed)
See other phone note

## 2023-04-11 ENCOUNTER — Ambulatory Visit: Payer: Medicare HMO | Admitting: Neurology

## 2023-04-11 ENCOUNTER — Encounter: Payer: Self-pay | Admitting: Neurology

## 2023-10-06 IMAGING — CT CT ABD-PELV W/ CM
2 of 7 series · 15 of 46 positions shown, 17 images · IV contrast (OMNIPAQUE 300)
Comparison: None.

CLINICAL DATA: Redness/swelling to lower abdomen and groin,
leukocytosis, sepsis, concern for Boddu gangrene.

EXAM:
CT ABDOMEN AND PELVIS WITH CONTRAST
TECHNIQUE: Multidetector CT imaging of the abdomen and pelvis was performed
using the standard protocol following bolus administration of
intravenous contrast.

[Series 2: axial st · axial · 0.68mm/px · z∈[-407,+23]mm · 12 of 98 slices shown, 14 images]
[im 6/98  soft-tissue]
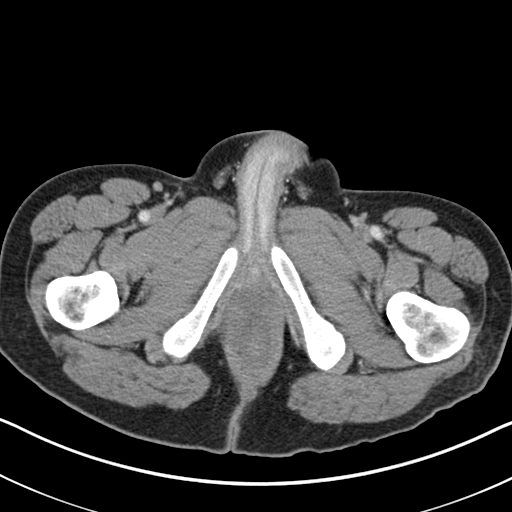
[im 6/98  bone]
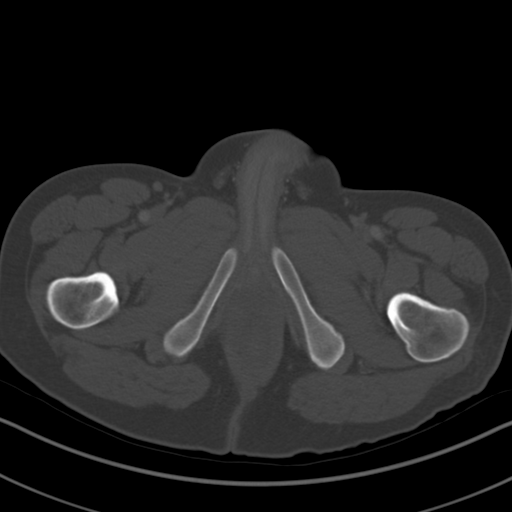
[im 17/98  soft-tissue]
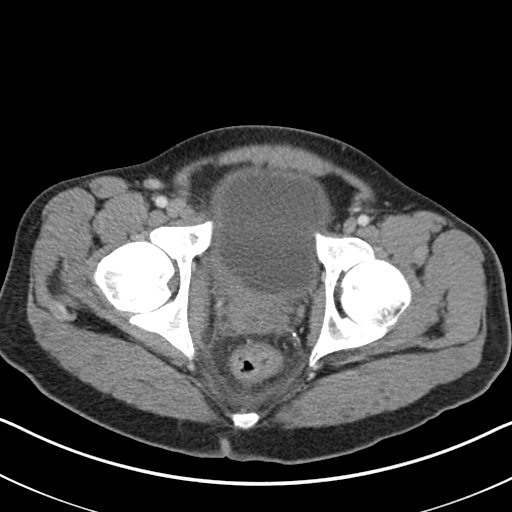
[im 22/98  soft-tissue]
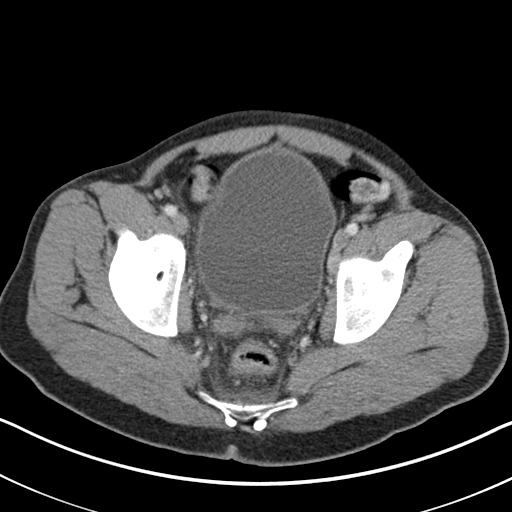
[im 27/98  soft-tissue]
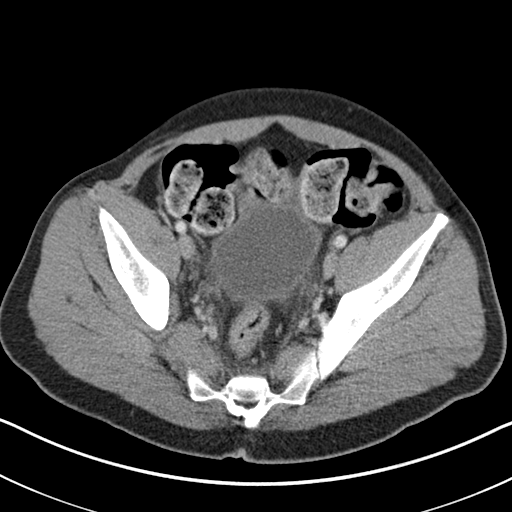
[im 38/98  soft-tissue]
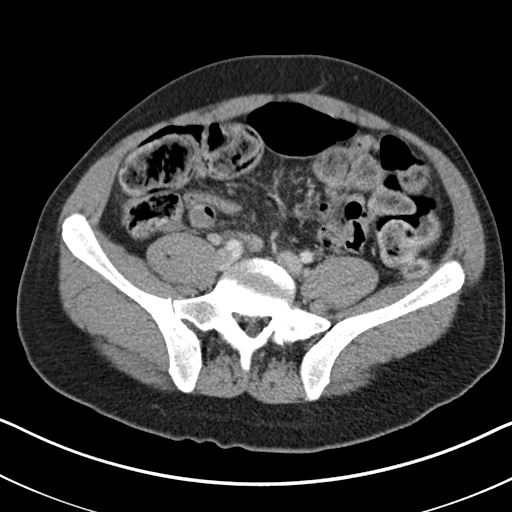
[im 44/98  soft-tissue]
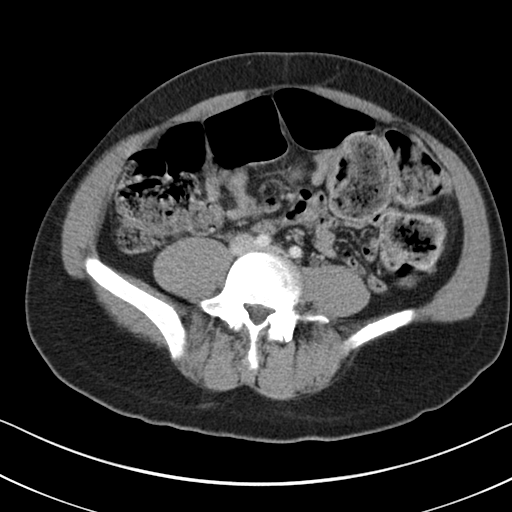
[im 54/98  soft-tissue]
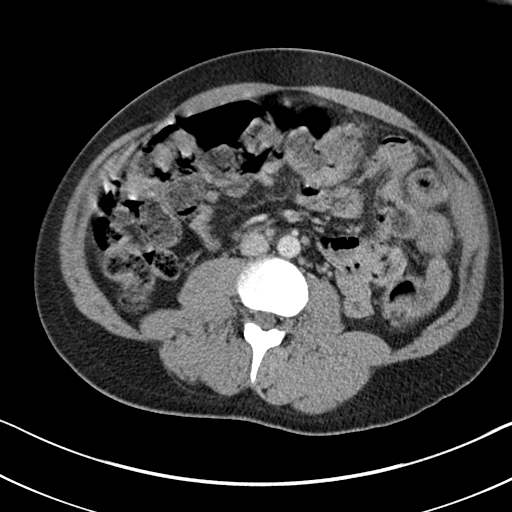
[im 60/98  soft-tissue]
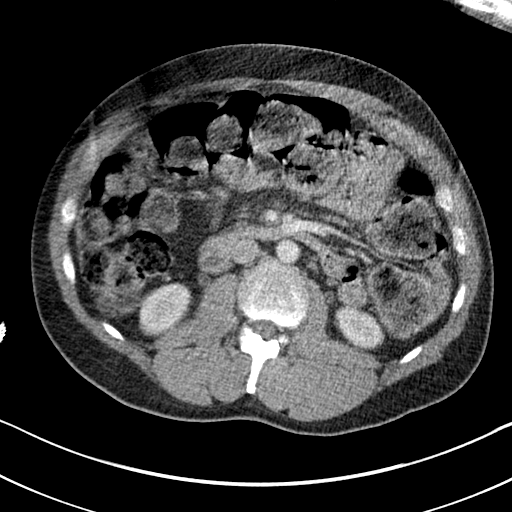
[im 71/98  soft-tissue]
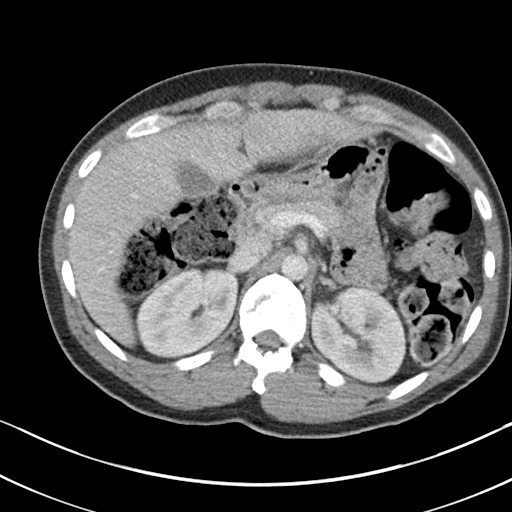
[im 71/98  bone]
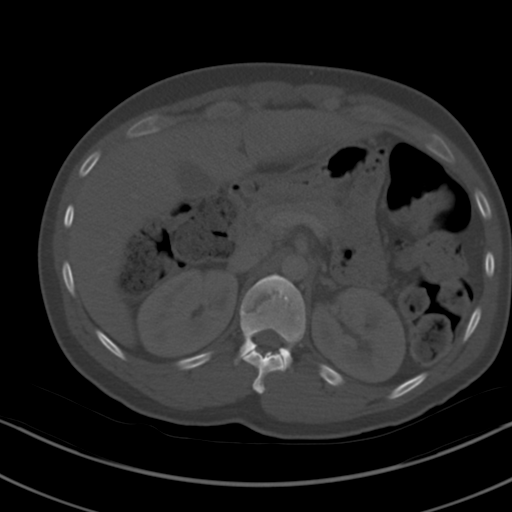
[im 76/98  soft-tissue]
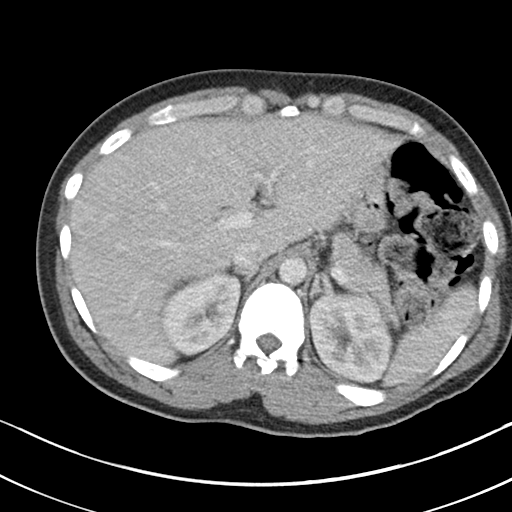
[im 81/98  soft-tissue]
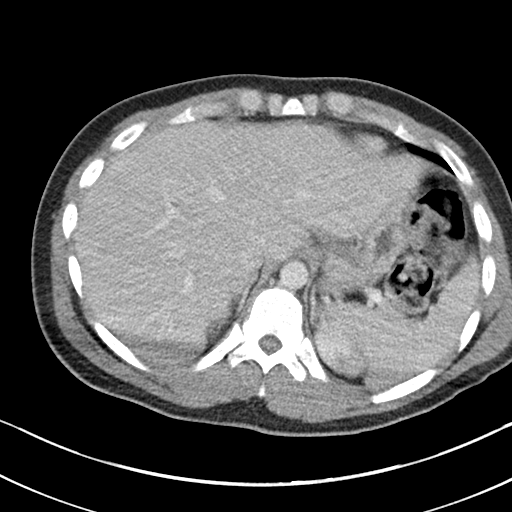
[im 92/98  soft-tissue]
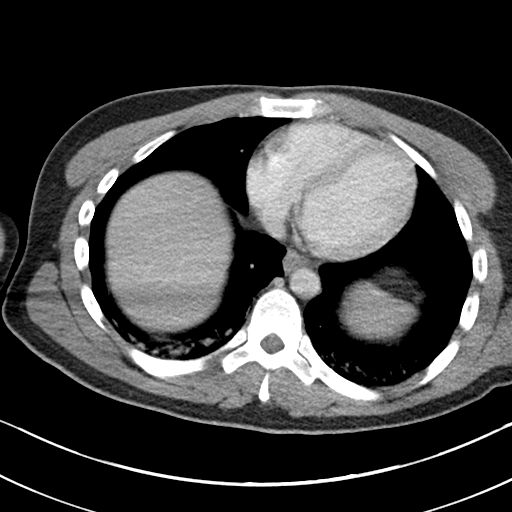

[Series 5: coronal st · coronal · 0.65mm/px · 3 of 151 slices shown]
[im 38/151  soft-tissue]
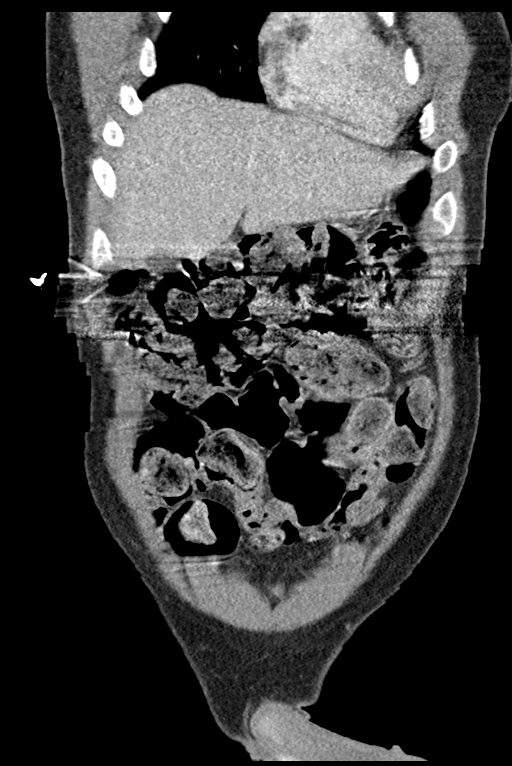
[im 76/151  soft-tissue]
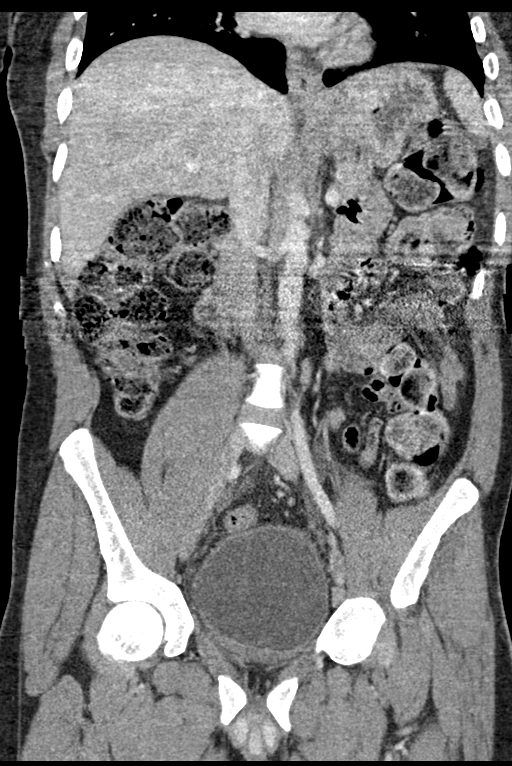
[im 113/151  soft-tissue]
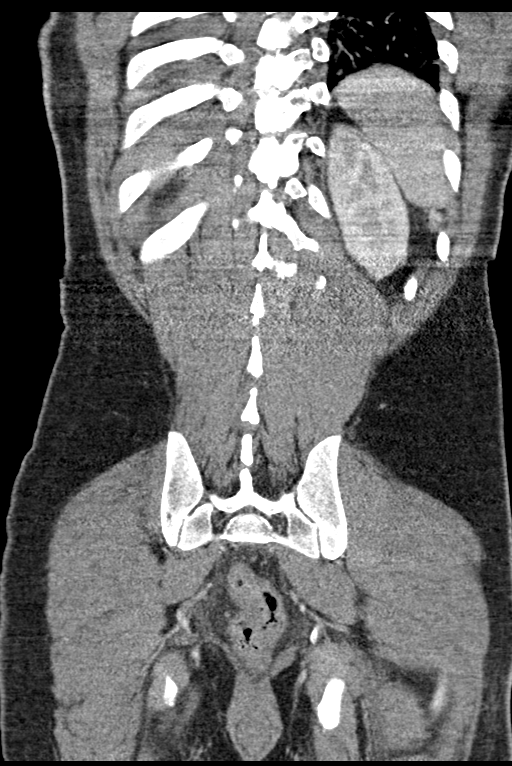

[15 of 46 positions shown; findings below may reference images not displayed]

RADIATION DOSE REDUCTION: This exam was performed according to the
departmental dose-optimization program which includes automated
exposure control, adjustment of the mA and/or kV according to
patient size and/or use of iterative reconstruction technique.

CONTRAST:  100mL OMNIPAQUE IOHEXOL 300 MG/ML  SOLN
FINDINGS: Motion degraded images.

Lower chest: Mild dependent atelectasis in the bilateral lower
lobes.

Hepatobiliary: Liver is within normal limits.

Gallbladder is unremarkable. No intrahepatic or extrahepatic ductal
dilatation.

Pancreas: Within normal limits.

Spleen: Within normal limits.

Adrenals/Urinary Tract: Adrenal glands are within normal limits.

18 mm left upper pole renal cyst (series 2/image 25). Right kidney
is within normal limits. No hydronephrosis.

Mildly thick-walled bladder.

Stomach/Bowel: Stomach is within normal limits.

No evidence of bowel obstruction.

Appendix is not discretely visualized, reportedly surgically absent.

Moderate colonic stool burden, suggesting constipation.

Vascular/Lymphatic: No evidence of abdominal aortic aneurysm.

No suspicious abdominopelvic lymphadenopathy.

Reproductive: Heterogeneous enhancement of the right prostate
(series 2/image 88), nonspecific. In this setting, correlate for
prostatitis.

Other: No abdominopelvic ascites.

Musculoskeletal: Mild degenerative changes at T12-L1.

No soft tissue gas/inflammatory changes in the perineal region to
suggest Boddu gangrene.
IMPRESSION: No findings to suggest Boddu gangrene.

Heterogeneous enhancement of the prostate, nonspecific. In this
setting, correlate for prostatitis.

Moderate colonic stool burden, suggesting constipation.

Mildly thick-walled bladder, raising the possibility of cystitis.

## 2023-11-29 ENCOUNTER — Ambulatory Visit: Payer: Medicare HMO | Admitting: Neurology

## 2023-12-28 ENCOUNTER — Ambulatory Visit: Admitting: Neurology

## 2023-12-28 ENCOUNTER — Encounter: Payer: Self-pay | Admitting: Neurology

## 2024-02-24 IMAGING — DX DG CHEST 1V PORT
1 series · 1 of 1 positions shown · non-contrast
Comparison: 04/06/2021

CLINICAL DATA: Seizures, altered mental status

EXAM:
PORTABLE CHEST 1 VIEW

[chest ap]
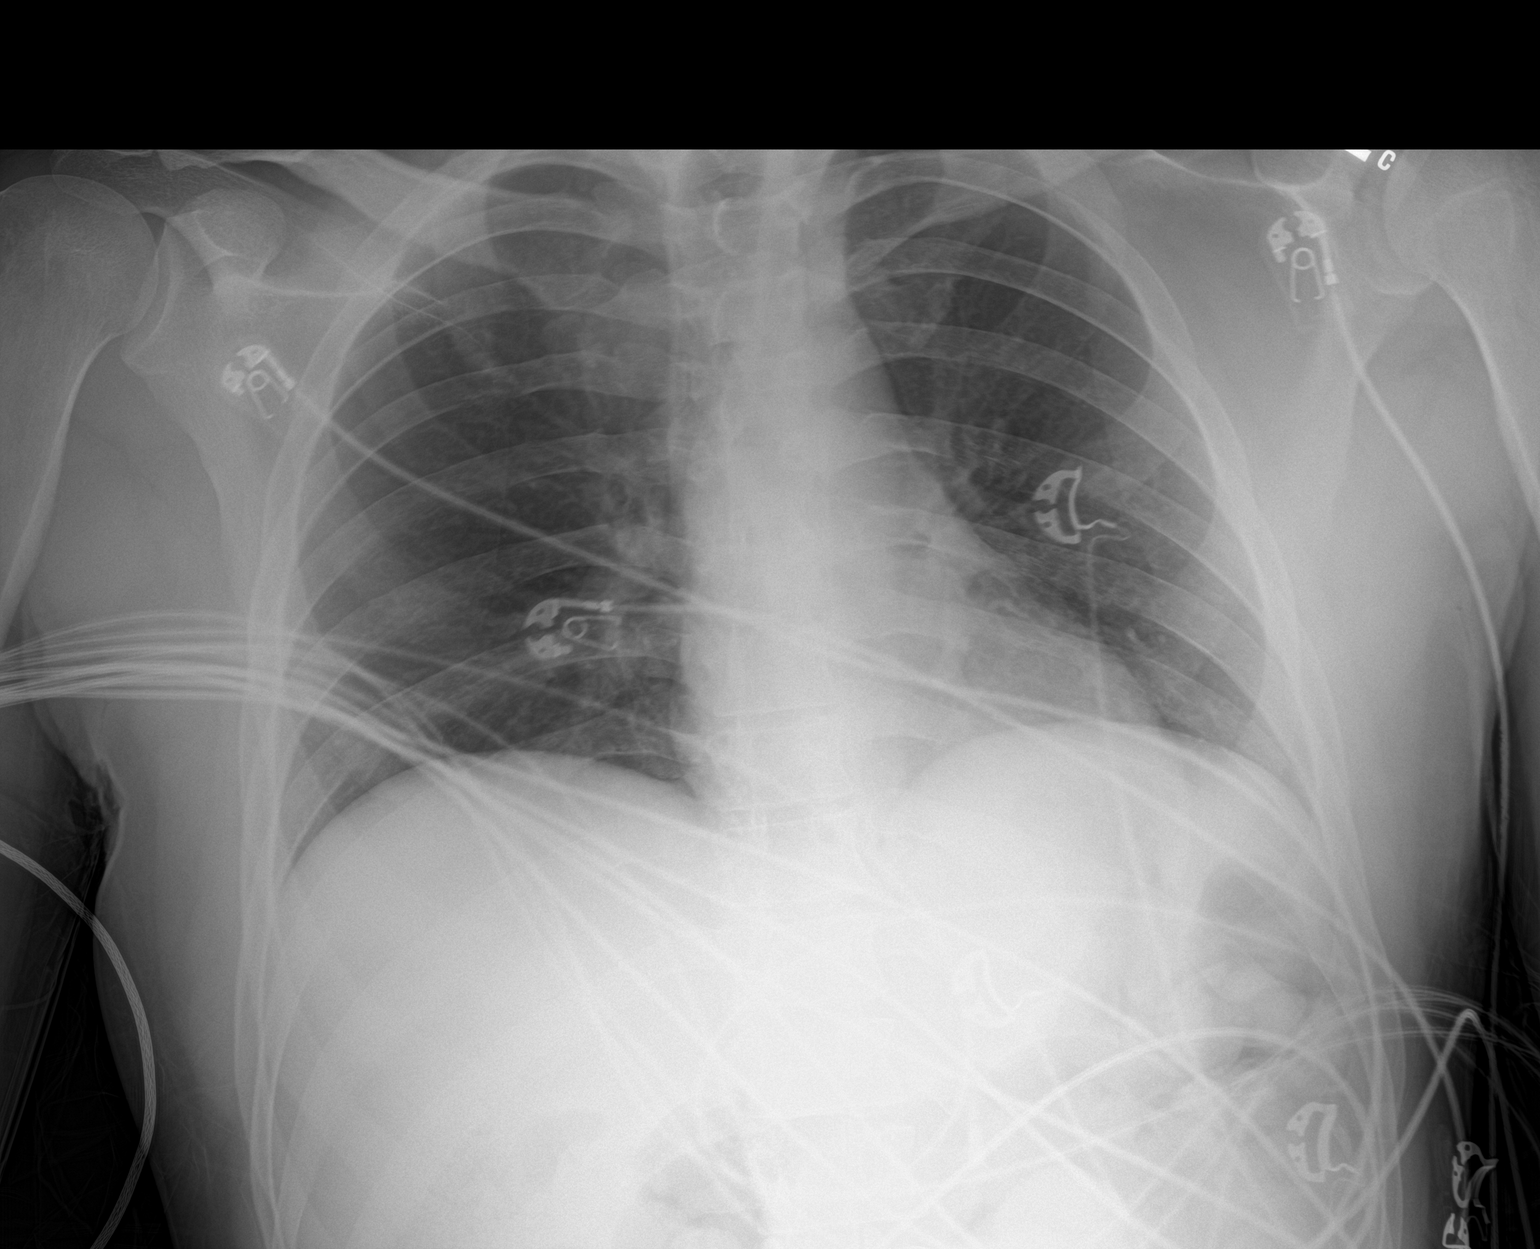

[1 of 1 positions shown; findings below may reference images not displayed]

FINDINGS: The heart size and mediastinal contours are within normal limits.
Both lungs are clear. The visualized skeletal structures are
unremarkable.
IMPRESSION: No active disease.
